# Patient Record
Sex: Male | Born: 1940 | Race: White | Hispanic: No | Marital: Married | Smoking: Former smoker
Health system: Southern US, Community
[De-identification: ages and names within clinical notes are randomized; demographics above are authoritative.]

## PROBLEM LIST (undated history)

## (undated) DIAGNOSIS — N186 End stage renal disease: Secondary | ICD-10-CM

## (undated) DIAGNOSIS — R234 Changes in skin texture: Secondary | ICD-10-CM

## (undated) DIAGNOSIS — E039 Hypothyroidism, unspecified: Secondary | ICD-10-CM

## (undated) DIAGNOSIS — E119 Type 2 diabetes mellitus without complications: Secondary | ICD-10-CM

## (undated) DIAGNOSIS — M1990 Unspecified osteoarthritis, unspecified site: Secondary | ICD-10-CM

## (undated) DIAGNOSIS — R410 Disorientation, unspecified: Secondary | ICD-10-CM

## (undated) DIAGNOSIS — Z950 Presence of cardiac pacemaker: Secondary | ICD-10-CM

## (undated) DIAGNOSIS — R0602 Shortness of breath: Secondary | ICD-10-CM

## (undated) DIAGNOSIS — R413 Other amnesia: Secondary | ICD-10-CM

## (undated) DIAGNOSIS — M2550 Pain in unspecified joint: Secondary | ICD-10-CM

## (undated) DIAGNOSIS — D649 Anemia, unspecified: Secondary | ICD-10-CM

## (undated) DIAGNOSIS — Z8719 Personal history of other diseases of the digestive system: Secondary | ICD-10-CM

## (undated) DIAGNOSIS — H269 Unspecified cataract: Secondary | ICD-10-CM

## (undated) DIAGNOSIS — G20A1 Parkinson's disease without dyskinesia, without mention of fluctuations: Secondary | ICD-10-CM

## (undated) DIAGNOSIS — F32A Depression, unspecified: Secondary | ICD-10-CM

## (undated) DIAGNOSIS — T8859XA Other complications of anesthesia, initial encounter: Secondary | ICD-10-CM

## (undated) DIAGNOSIS — I739 Peripheral vascular disease, unspecified: Secondary | ICD-10-CM

## (undated) DIAGNOSIS — K5792 Diverticulitis of intestine, part unspecified, without perforation or abscess without bleeding: Secondary | ICD-10-CM

## (undated) DIAGNOSIS — M549 Dorsalgia, unspecified: Secondary | ICD-10-CM

## (undated) DIAGNOSIS — G20 Parkinson's disease: Secondary | ICD-10-CM

## (undated) DIAGNOSIS — Z860100 Personal history of colon polyps, unspecified: Secondary | ICD-10-CM

## (undated) DIAGNOSIS — I4891 Unspecified atrial fibrillation: Secondary | ICD-10-CM

## (undated) DIAGNOSIS — G4700 Insomnia, unspecified: Secondary | ICD-10-CM

## (undated) DIAGNOSIS — J45909 Unspecified asthma, uncomplicated: Secondary | ICD-10-CM

## (undated) DIAGNOSIS — T4145XA Adverse effect of unspecified anesthetic, initial encounter: Secondary | ICD-10-CM

## (undated) DIAGNOSIS — Z9289 Personal history of other medical treatment: Secondary | ICD-10-CM

## (undated) DIAGNOSIS — M2540 Effusion, unspecified joint: Secondary | ICD-10-CM

## (undated) DIAGNOSIS — G629 Polyneuropathy, unspecified: Secondary | ICD-10-CM

## (undated) DIAGNOSIS — G8929 Other chronic pain: Secondary | ICD-10-CM

## (undated) DIAGNOSIS — I2510 Atherosclerotic heart disease of native coronary artery without angina pectoris: Secondary | ICD-10-CM

## (undated) DIAGNOSIS — R233 Spontaneous ecchymoses: Secondary | ICD-10-CM

## (undated) DIAGNOSIS — R238 Other skin changes: Secondary | ICD-10-CM

## (undated) DIAGNOSIS — I779 Disorder of arteries and arterioles, unspecified: Secondary | ICD-10-CM

## (undated) DIAGNOSIS — Z8709 Personal history of other diseases of the respiratory system: Secondary | ICD-10-CM

## (undated) DIAGNOSIS — F329 Major depressive disorder, single episode, unspecified: Secondary | ICD-10-CM

## (undated) DIAGNOSIS — I959 Hypotension, unspecified: Secondary | ICD-10-CM

## (undated) DIAGNOSIS — K219 Gastro-esophageal reflux disease without esophagitis: Secondary | ICD-10-CM

## (undated) DIAGNOSIS — J189 Pneumonia, unspecified organism: Secondary | ICD-10-CM

## (undated) DIAGNOSIS — Z87820 Personal history of traumatic brain injury: Secondary | ICD-10-CM

## (undated) DIAGNOSIS — I495 Sick sinus syndrome: Secondary | ICD-10-CM

## (undated) DIAGNOSIS — C801 Malignant (primary) neoplasm, unspecified: Secondary | ICD-10-CM

## (undated) HISTORY — PX: CORONARY ARTERY BYPASS GRAFT: SHX141

## (undated) HISTORY — PX: INSERT / REPLACE / REMOVE PACEMAKER: SUR710

## (undated) HISTORY — PX: FEMORAL ENDARTERECTOMY: SUR606

## (undated) HISTORY — PX: ADENOIDECTOMY: SUR15

## (undated) HISTORY — DX: End stage renal disease: N18.6

## (undated) HISTORY — PX: COLONOSCOPY: SHX174

## (undated) HISTORY — DX: Unspecified atrial fibrillation: I48.91

## (undated) HISTORY — DX: Sick sinus syndrome: I49.5

## (undated) HISTORY — DX: Peripheral vascular disease, unspecified: I73.9

## (undated) HISTORY — PX: BLADDER SURGERY: SHX569

## (undated) HISTORY — DX: Presence of cardiac pacemaker: Z95.0

## (undated) HISTORY — DX: Malignant (primary) neoplasm, unspecified: C80.1

## (undated) HISTORY — DX: Anemia, unspecified: D64.9

## (undated) HISTORY — DX: Diverticulitis of intestine, part unspecified, without perforation or abscess without bleeding: K57.92

## (undated) HISTORY — PX: OTHER SURGICAL HISTORY: SHX169

## (undated) HISTORY — DX: Disorder of arteries and arterioles, unspecified: I77.9

## (undated) HISTORY — PX: CATARACT EXTRACTION: SUR2

## (undated) HISTORY — PX: TONSILLECTOMY: SHX5217

## (undated) HISTORY — PX: CERVICAL FUSION: SHX112

## (undated) HISTORY — PX: TOE AMPUTATION: SHX809

## (undated) HISTORY — PX: CORONARY ANGIOPLASTY: SHX604

## (undated) HISTORY — PX: TONSILLECTOMY: SUR1361

## (undated) HISTORY — PX: CHOLECYSTECTOMY: SHX55

## (undated) HISTORY — DX: Atherosclerotic heart disease of native coronary artery without angina pectoris: I25.10

---

## 2001-03-15 ENCOUNTER — Encounter: Payer: Self-pay | Admitting: Neurosurgery

## 2001-03-17 ENCOUNTER — Ambulatory Visit (HOSPITAL_COMMUNITY): Admission: RE | Admit: 2001-03-17 | Discharge: 2001-03-17 | Payer: Self-pay | Admitting: Neurosurgery

## 2001-03-17 ENCOUNTER — Encounter: Payer: Self-pay | Admitting: Neurosurgery

## 2001-10-29 ENCOUNTER — Encounter: Payer: Self-pay | Admitting: Cardiothoracic Surgery

## 2001-11-02 ENCOUNTER — Encounter: Payer: Self-pay | Admitting: Cardiothoracic Surgery

## 2001-11-02 ENCOUNTER — Inpatient Hospital Stay (HOSPITAL_COMMUNITY): Admission: RE | Admit: 2001-11-02 | Discharge: 2001-11-11 | Payer: Self-pay | Admitting: Cardiothoracic Surgery

## 2001-11-03 ENCOUNTER — Encounter: Payer: Self-pay | Admitting: Cardiothoracic Surgery

## 2001-11-04 ENCOUNTER — Encounter: Payer: Self-pay | Admitting: Cardiothoracic Surgery

## 2001-11-05 ENCOUNTER — Encounter: Payer: Self-pay | Admitting: Cardiothoracic Surgery

## 2001-11-07 ENCOUNTER — Encounter: Payer: Self-pay | Admitting: Cardiothoracic Surgery

## 2001-11-08 ENCOUNTER — Encounter: Payer: Self-pay | Admitting: Cardiothoracic Surgery

## 2001-12-02 ENCOUNTER — Encounter: Payer: Self-pay | Admitting: Cardiothoracic Surgery

## 2001-12-02 ENCOUNTER — Encounter: Admission: RE | Admit: 2001-12-02 | Discharge: 2001-12-02 | Payer: Self-pay | Admitting: Cardiothoracic Surgery

## 2003-07-21 ENCOUNTER — Other Ambulatory Visit: Payer: Self-pay

## 2004-03-02 ENCOUNTER — Ambulatory Visit: Payer: Self-pay | Admitting: Internal Medicine

## 2004-03-05 ENCOUNTER — Emergency Department: Payer: Self-pay | Admitting: Unknown Physician Specialty

## 2004-04-02 ENCOUNTER — Ambulatory Visit: Payer: Self-pay | Admitting: Internal Medicine

## 2004-04-10 ENCOUNTER — Ambulatory Visit: Payer: Self-pay | Admitting: Specialist

## 2004-04-28 ENCOUNTER — Emergency Department: Payer: Self-pay | Admitting: Emergency Medicine

## 2004-04-29 ENCOUNTER — Ambulatory Visit: Payer: Self-pay | Admitting: Specialist

## 2004-05-02 ENCOUNTER — Ambulatory Visit: Payer: Self-pay | Admitting: Internal Medicine

## 2004-05-09 ENCOUNTER — Ambulatory Visit: Payer: Self-pay | Admitting: Specialist

## 2004-05-22 ENCOUNTER — Ambulatory Visit: Payer: Self-pay | Admitting: Internal Medicine

## 2004-06-02 ENCOUNTER — Ambulatory Visit: Payer: Self-pay | Admitting: Internal Medicine

## 2004-06-06 ENCOUNTER — Ambulatory Visit: Payer: Self-pay | Admitting: Cardiology

## 2004-06-06 ENCOUNTER — Ambulatory Visit (HOSPITAL_COMMUNITY): Admission: RE | Admit: 2004-06-06 | Discharge: 2004-06-06 | Payer: Self-pay | Admitting: Cardiology

## 2004-06-06 ENCOUNTER — Encounter: Payer: Self-pay | Admitting: Cardiology

## 2004-06-24 ENCOUNTER — Ambulatory Visit: Payer: Self-pay

## 2004-06-25 ENCOUNTER — Ambulatory Visit: Payer: Self-pay | Admitting: Internal Medicine

## 2004-06-25 ENCOUNTER — Inpatient Hospital Stay (HOSPITAL_COMMUNITY): Admission: RE | Admit: 2004-06-25 | Discharge: 2004-06-27 | Payer: Self-pay | Admitting: Internal Medicine

## 2004-06-25 HISTORY — PX: PACEMAKER INSERTION: SHX728

## 2004-07-02 ENCOUNTER — Ambulatory Visit: Payer: Self-pay

## 2004-07-02 ENCOUNTER — Ambulatory Visit: Payer: Self-pay | Admitting: Cardiology

## 2004-07-02 ENCOUNTER — Ambulatory Visit: Payer: Self-pay | Admitting: Internal Medicine

## 2004-07-03 ENCOUNTER — Ambulatory Visit: Payer: Self-pay | Admitting: Internal Medicine

## 2004-07-23 ENCOUNTER — Ambulatory Visit: Payer: Self-pay | Admitting: Internal Medicine

## 2004-07-31 ENCOUNTER — Ambulatory Visit: Payer: Self-pay | Admitting: Internal Medicine

## 2004-08-27 ENCOUNTER — Ambulatory Visit: Payer: Self-pay | Admitting: Specialist

## 2004-09-03 ENCOUNTER — Ambulatory Visit: Payer: Self-pay | Admitting: Specialist

## 2004-09-04 ENCOUNTER — Ambulatory Visit: Payer: Self-pay | Admitting: Internal Medicine

## 2004-09-30 ENCOUNTER — Ambulatory Visit: Payer: Self-pay | Admitting: Internal Medicine

## 2004-10-15 ENCOUNTER — Ambulatory Visit: Payer: Self-pay | Admitting: Internal Medicine

## 2004-10-31 ENCOUNTER — Ambulatory Visit: Payer: Self-pay | Admitting: Internal Medicine

## 2004-11-20 ENCOUNTER — Ambulatory Visit: Payer: Self-pay | Admitting: Vascular Surgery

## 2004-11-22 ENCOUNTER — Ambulatory Visit: Payer: Self-pay | Admitting: Vascular Surgery

## 2004-11-30 ENCOUNTER — Ambulatory Visit: Payer: Self-pay | Admitting: Internal Medicine

## 2004-12-31 ENCOUNTER — Ambulatory Visit: Payer: Self-pay | Admitting: Internal Medicine

## 2005-01-30 ENCOUNTER — Ambulatory Visit: Payer: Self-pay | Admitting: Specialist

## 2005-02-04 ENCOUNTER — Ambulatory Visit: Payer: Self-pay | Admitting: Specialist

## 2005-02-06 ENCOUNTER — Ambulatory Visit: Payer: Self-pay | Admitting: Internal Medicine

## 2005-03-02 ENCOUNTER — Ambulatory Visit: Payer: Self-pay | Admitting: Internal Medicine

## 2005-04-02 ENCOUNTER — Ambulatory Visit: Payer: Self-pay | Admitting: Internal Medicine

## 2005-04-16 ENCOUNTER — Ambulatory Visit: Payer: Self-pay | Admitting: Unknown Physician Specialty

## 2005-04-28 ENCOUNTER — Other Ambulatory Visit: Payer: Self-pay

## 2005-04-28 ENCOUNTER — Inpatient Hospital Stay: Payer: Self-pay | Admitting: Internal Medicine

## 2005-05-02 ENCOUNTER — Ambulatory Visit: Payer: Self-pay | Admitting: Internal Medicine

## 2005-05-14 ENCOUNTER — Inpatient Hospital Stay: Payer: Self-pay | Admitting: Internal Medicine

## 2005-05-28 ENCOUNTER — Ambulatory Visit: Payer: Self-pay | Admitting: Specialist

## 2005-06-03 ENCOUNTER — Ambulatory Visit: Payer: Self-pay | Admitting: Specialist

## 2005-06-04 ENCOUNTER — Ambulatory Visit: Payer: Self-pay | Admitting: Internal Medicine

## 2005-06-30 ENCOUNTER — Ambulatory Visit: Payer: Self-pay | Admitting: Otolaryngology

## 2005-07-03 ENCOUNTER — Ambulatory Visit: Payer: Self-pay | Admitting: Otolaryngology

## 2005-07-04 ENCOUNTER — Ambulatory Visit: Payer: Self-pay | Admitting: Internal Medicine

## 2005-07-31 ENCOUNTER — Ambulatory Visit: Payer: Self-pay | Admitting: Internal Medicine

## 2005-08-31 ENCOUNTER — Ambulatory Visit: Payer: Self-pay | Admitting: Internal Medicine

## 2005-09-30 ENCOUNTER — Ambulatory Visit: Payer: Self-pay | Admitting: Internal Medicine

## 2005-10-24 ENCOUNTER — Ambulatory Visit: Payer: Self-pay | Admitting: Vascular Surgery

## 2005-10-28 ENCOUNTER — Ambulatory Visit: Payer: Self-pay | Admitting: Vascular Surgery

## 2005-10-31 ENCOUNTER — Ambulatory Visit: Payer: Self-pay | Admitting: Internal Medicine

## 2005-11-12 ENCOUNTER — Encounter: Admission: RE | Admit: 2005-11-12 | Discharge: 2005-11-12 | Payer: Self-pay | Admitting: Neurosurgery

## 2005-11-30 ENCOUNTER — Ambulatory Visit: Payer: Self-pay | Admitting: Internal Medicine

## 2005-12-18 ENCOUNTER — Ambulatory Visit: Payer: Self-pay | Admitting: Specialist

## 2005-12-18 ENCOUNTER — Other Ambulatory Visit: Payer: Self-pay

## 2005-12-31 ENCOUNTER — Ambulatory Visit: Payer: Self-pay | Admitting: Internal Medicine

## 2006-01-29 ENCOUNTER — Ambulatory Visit: Payer: Self-pay | Admitting: Specialist

## 2006-01-31 ENCOUNTER — Ambulatory Visit: Payer: Self-pay | Admitting: Internal Medicine

## 2006-03-02 ENCOUNTER — Ambulatory Visit: Payer: Self-pay | Admitting: Internal Medicine

## 2006-04-02 ENCOUNTER — Ambulatory Visit: Payer: Self-pay | Admitting: Internal Medicine

## 2006-05-02 ENCOUNTER — Ambulatory Visit: Payer: Self-pay | Admitting: Internal Medicine

## 2006-06-02 ENCOUNTER — Ambulatory Visit: Payer: Self-pay | Admitting: Internal Medicine

## 2006-07-03 ENCOUNTER — Ambulatory Visit: Payer: Self-pay | Admitting: Internal Medicine

## 2006-07-31 ENCOUNTER — Ambulatory Visit: Payer: Self-pay | Admitting: Urology

## 2006-07-31 ENCOUNTER — Other Ambulatory Visit: Payer: Self-pay

## 2006-08-11 ENCOUNTER — Ambulatory Visit: Payer: Self-pay | Admitting: Urology

## 2006-08-14 ENCOUNTER — Ambulatory Visit: Payer: Self-pay | Admitting: Internal Medicine

## 2006-09-01 ENCOUNTER — Ambulatory Visit: Payer: Self-pay | Admitting: Internal Medicine

## 2006-10-01 ENCOUNTER — Ambulatory Visit: Payer: Self-pay | Admitting: Internal Medicine

## 2006-11-01 ENCOUNTER — Ambulatory Visit: Payer: Self-pay | Admitting: Internal Medicine

## 2006-12-01 ENCOUNTER — Ambulatory Visit: Payer: Self-pay | Admitting: Internal Medicine

## 2007-01-01 ENCOUNTER — Ambulatory Visit: Payer: Self-pay | Admitting: Internal Medicine

## 2007-02-01 ENCOUNTER — Ambulatory Visit: Payer: Self-pay | Admitting: Internal Medicine

## 2007-03-03 ENCOUNTER — Ambulatory Visit: Payer: Self-pay | Admitting: Internal Medicine

## 2007-04-03 ENCOUNTER — Ambulatory Visit: Payer: Self-pay | Admitting: Internal Medicine

## 2007-05-03 ENCOUNTER — Ambulatory Visit: Payer: Self-pay | Admitting: Internal Medicine

## 2007-06-03 ENCOUNTER — Ambulatory Visit: Payer: Self-pay | Admitting: Internal Medicine

## 2007-06-14 ENCOUNTER — Ambulatory Visit: Payer: Self-pay | Admitting: Internal Medicine

## 2007-07-02 ENCOUNTER — Encounter: Payer: Self-pay | Admitting: Orthopedic Surgery

## 2007-07-04 ENCOUNTER — Ambulatory Visit: Payer: Self-pay | Admitting: Internal Medicine

## 2007-07-06 ENCOUNTER — Encounter: Payer: Self-pay | Admitting: Orthopedic Surgery

## 2007-07-12 ENCOUNTER — Ambulatory Visit: Payer: Self-pay | Admitting: Internal Medicine

## 2007-08-01 ENCOUNTER — Ambulatory Visit: Payer: Self-pay | Admitting: Internal Medicine

## 2007-09-01 ENCOUNTER — Ambulatory Visit: Payer: Self-pay | Admitting: Internal Medicine

## 2007-10-01 ENCOUNTER — Ambulatory Visit: Payer: Self-pay | Admitting: Internal Medicine

## 2007-11-01 ENCOUNTER — Ambulatory Visit: Payer: Self-pay | Admitting: Internal Medicine

## 2007-12-01 ENCOUNTER — Ambulatory Visit: Payer: Self-pay | Admitting: Internal Medicine

## 2007-12-27 ENCOUNTER — Ambulatory Visit: Payer: Self-pay | Admitting: Internal Medicine

## 2008-01-01 ENCOUNTER — Ambulatory Visit: Payer: Self-pay | Admitting: Internal Medicine

## 2008-01-05 ENCOUNTER — Ambulatory Visit: Payer: Self-pay

## 2008-01-05 ENCOUNTER — Encounter: Payer: Self-pay | Admitting: Internal Medicine

## 2008-01-11 ENCOUNTER — Emergency Department: Payer: Self-pay | Admitting: Emergency Medicine

## 2008-01-17 ENCOUNTER — Ambulatory Visit: Payer: Self-pay | Admitting: Internal Medicine

## 2008-02-08 ENCOUNTER — Ambulatory Visit: Payer: Self-pay | Admitting: Internal Medicine

## 2008-02-19 ENCOUNTER — Emergency Department: Payer: Self-pay | Admitting: Emergency Medicine

## 2008-03-01 ENCOUNTER — Ambulatory Visit: Payer: Self-pay | Admitting: Urology

## 2008-03-02 ENCOUNTER — Ambulatory Visit: Payer: Self-pay | Admitting: Internal Medicine

## 2008-03-07 ENCOUNTER — Ambulatory Visit: Payer: Self-pay | Admitting: Urology

## 2008-03-10 ENCOUNTER — Ambulatory Visit: Payer: Self-pay | Admitting: Internal Medicine

## 2008-04-02 ENCOUNTER — Ambulatory Visit: Payer: Self-pay | Admitting: Internal Medicine

## 2008-05-02 ENCOUNTER — Ambulatory Visit: Payer: Self-pay | Admitting: Internal Medicine

## 2008-06-02 ENCOUNTER — Ambulatory Visit: Payer: Medicare Other | Admitting: Internal Medicine

## 2008-06-19 ENCOUNTER — Ambulatory Visit: Payer: Self-pay | Admitting: Internal Medicine

## 2008-07-03 ENCOUNTER — Ambulatory Visit: Payer: Medicare Other | Admitting: Internal Medicine

## 2008-07-27 ENCOUNTER — Ambulatory Visit: Payer: Medicare Other | Admitting: Urology

## 2008-07-31 ENCOUNTER — Ambulatory Visit: Payer: Medicare Other | Admitting: Internal Medicine

## 2008-08-03 ENCOUNTER — Ambulatory Visit: Payer: Medicare Other | Admitting: Specialist

## 2008-08-03 ENCOUNTER — Encounter: Payer: Self-pay | Admitting: Internal Medicine

## 2008-08-07 ENCOUNTER — Ambulatory Visit: Payer: Medicare Other | Admitting: Urology

## 2008-08-11 ENCOUNTER — Ambulatory Visit: Payer: Medicare Other | Admitting: Anesthesiology

## 2008-08-15 ENCOUNTER — Ambulatory Visit: Payer: Medicare Other | Admitting: Urology

## 2008-08-31 ENCOUNTER — Ambulatory Visit: Payer: Medicare Other | Admitting: Internal Medicine

## 2008-09-06 ENCOUNTER — Ambulatory Visit: Payer: Medicare Other | Admitting: Urology

## 2008-09-08 ENCOUNTER — Encounter (INDEPENDENT_AMBULATORY_CARE_PROVIDER_SITE_OTHER): Payer: Self-pay | Admitting: Radiology

## 2008-09-11 ENCOUNTER — Ambulatory Visit: Payer: Medicare Other | Admitting: Internal Medicine

## 2008-09-30 ENCOUNTER — Ambulatory Visit: Payer: Medicare Other | Admitting: Internal Medicine

## 2008-11-06 ENCOUNTER — Ambulatory Visit: Payer: Medicare Other | Admitting: Internal Medicine

## 2008-11-07 ENCOUNTER — Ambulatory Visit: Payer: Medicare Other | Admitting: Urology

## 2008-11-09 DIAGNOSIS — I2510 Atherosclerotic heart disease of native coronary artery without angina pectoris: Secondary | ICD-10-CM | POA: Insufficient documentation

## 2008-11-09 DIAGNOSIS — I4891 Unspecified atrial fibrillation: Secondary | ICD-10-CM

## 2008-11-09 DIAGNOSIS — I498 Other specified cardiac arrhythmias: Secondary | ICD-10-CM

## 2008-11-09 DIAGNOSIS — Z950 Presence of cardiac pacemaker: Secondary | ICD-10-CM

## 2008-11-09 DIAGNOSIS — I739 Peripheral vascular disease, unspecified: Secondary | ICD-10-CM

## 2008-11-14 ENCOUNTER — Encounter: Payer: Self-pay | Admitting: Cardiology

## 2008-11-15 ENCOUNTER — Encounter: Payer: Self-pay | Admitting: Cardiology

## 2008-11-21 ENCOUNTER — Ambulatory Visit: Payer: Medicare Other | Admitting: Urology

## 2008-11-30 ENCOUNTER — Ambulatory Visit: Payer: Medicare Other | Admitting: Internal Medicine

## 2008-12-08 ENCOUNTER — Encounter: Payer: Self-pay | Admitting: Cardiovascular Disease

## 2008-12-08 LAB — CONVERTED CEMR LAB
BUN: 62 mg/dL
CO2: 18 meq/L
Calcium: 9.3 mg/dL
Chloride: 106 meq/L
Creatinine, Ser: 4.94 mg/dL
Glucose, Bld: 100 mg/dL
Sodium: 140 meq/L

## 2008-12-22 ENCOUNTER — Encounter: Payer: Self-pay | Admitting: Internal Medicine

## 2008-12-31 ENCOUNTER — Ambulatory Visit: Payer: Medicare Other | Admitting: Internal Medicine

## 2009-01-08 ENCOUNTER — Ambulatory Visit: Payer: Self-pay | Admitting: Internal Medicine

## 2009-01-08 DIAGNOSIS — Z992 Dependence on renal dialysis: Secondary | ICD-10-CM

## 2009-01-08 DIAGNOSIS — N186 End stage renal disease: Secondary | ICD-10-CM | POA: Insufficient documentation

## 2009-01-09 ENCOUNTER — Ambulatory Visit: Payer: Medicare Other | Admitting: Urology

## 2009-01-31 ENCOUNTER — Ambulatory Visit: Payer: Medicare Other | Admitting: Internal Medicine

## 2009-02-12 ENCOUNTER — Ambulatory Visit: Payer: Medicare Other | Admitting: Internal Medicine

## 2009-02-23 ENCOUNTER — Ambulatory Visit: Payer: Medicare Other | Admitting: Neurosurgery

## 2009-03-02 ENCOUNTER — Ambulatory Visit: Payer: Medicare Other | Admitting: Internal Medicine

## 2009-04-02 ENCOUNTER — Ambulatory Visit: Payer: Medicare Other | Admitting: Internal Medicine

## 2009-04-16 ENCOUNTER — Ambulatory Visit: Payer: Self-pay | Admitting: Internal Medicine

## 2009-04-23 ENCOUNTER — Ambulatory Visit: Payer: Medicare Other | Admitting: Urology

## 2009-05-02 ENCOUNTER — Ambulatory Visit: Payer: Medicare Other | Admitting: Internal Medicine

## 2009-06-02 ENCOUNTER — Ambulatory Visit: Payer: Medicare Other | Admitting: Internal Medicine

## 2009-06-04 ENCOUNTER — Ambulatory Visit: Payer: Medicare Other | Admitting: Pain Medicine

## 2009-06-07 ENCOUNTER — Ambulatory Visit: Payer: Medicare Other | Admitting: Pain Medicine

## 2009-06-11 ENCOUNTER — Ambulatory Visit: Payer: Medicare Other | Admitting: Internal Medicine

## 2009-06-19 ENCOUNTER — Ambulatory Visit: Payer: Medicare Other | Admitting: Pain Medicine

## 2009-07-02 ENCOUNTER — Ambulatory Visit: Payer: Medicare Other | Admitting: Specialist

## 2009-07-02 ENCOUNTER — Encounter: Payer: Self-pay | Admitting: Internal Medicine

## 2009-07-03 ENCOUNTER — Ambulatory Visit: Payer: Medicare Other | Admitting: Internal Medicine

## 2009-07-09 ENCOUNTER — Encounter: Payer: Self-pay | Admitting: Internal Medicine

## 2009-07-09 ENCOUNTER — Ambulatory Visit: Payer: Self-pay | Admitting: Internal Medicine

## 2009-07-09 DIAGNOSIS — I509 Heart failure, unspecified: Secondary | ICD-10-CM | POA: Insufficient documentation

## 2009-07-09 DIAGNOSIS — R0602 Shortness of breath: Secondary | ICD-10-CM

## 2009-07-11 ENCOUNTER — Ambulatory Visit: Payer: Medicare Other | Admitting: Pain Medicine

## 2009-07-12 ENCOUNTER — Encounter: Payer: Self-pay | Admitting: Cardiovascular Disease

## 2009-07-12 ENCOUNTER — Ambulatory Visit: Payer: Self-pay

## 2009-07-12 ENCOUNTER — Encounter: Payer: Self-pay | Admitting: Internal Medicine

## 2009-07-13 LAB — CONVERTED CEMR LAB
BUN: 78 mg/dL — ABNORMAL HIGH (ref 6–23)
Creatinine, Ser: 6.18 mg/dL — ABNORMAL HIGH (ref 0.40–1.50)
Glucose, Bld: 116 mg/dL — ABNORMAL HIGH (ref 70–99)
Pro B Natriuretic peptide (BNP): 829.6 pg/mL — ABNORMAL HIGH (ref 0.0–100.0)

## 2009-07-16 ENCOUNTER — Encounter: Payer: Self-pay | Admitting: Cardiovascular Disease

## 2009-07-19 ENCOUNTER — Ambulatory Visit: Payer: Self-pay | Admitting: Internal Medicine

## 2009-07-24 ENCOUNTER — Ambulatory Visit: Payer: Medicare Other | Admitting: Pain Medicine

## 2009-07-25 ENCOUNTER — Ambulatory Visit: Payer: Medicare Other | Admitting: Vascular Surgery

## 2009-07-31 ENCOUNTER — Ambulatory Visit: Payer: Medicare Other | Admitting: Internal Medicine

## 2009-08-01 ENCOUNTER — Ambulatory Visit: Payer: Medicare Other | Admitting: Vascular Surgery

## 2009-08-09 ENCOUNTER — Ambulatory Visit: Payer: Medicare Other | Admitting: Pain Medicine

## 2009-08-13 ENCOUNTER — Ambulatory Visit: Payer: Medicare Other | Admitting: Internal Medicine

## 2009-08-31 ENCOUNTER — Ambulatory Visit: Payer: Medicare Other | Admitting: Internal Medicine

## 2009-10-10 ENCOUNTER — Ambulatory Visit: Payer: Self-pay | Admitting: Internal Medicine

## 2009-10-16 ENCOUNTER — Encounter: Payer: Self-pay | Admitting: Internal Medicine

## 2009-10-24 ENCOUNTER — Ambulatory Visit: Payer: Medicare Other | Admitting: Urology

## 2010-01-02 ENCOUNTER — Encounter: Admission: RE | Admit: 2010-01-02 | Discharge: 2010-01-02 | Payer: Self-pay | Admitting: Neurosurgery

## 2010-01-10 ENCOUNTER — Ambulatory Visit: Payer: Self-pay | Admitting: Internal Medicine

## 2010-01-21 ENCOUNTER — Encounter: Payer: Self-pay | Admitting: Internal Medicine

## 2010-02-08 ENCOUNTER — Encounter: Payer: Self-pay | Admitting: Internal Medicine

## 2010-02-08 ENCOUNTER — Observation Stay: Payer: Medicare Other | Admitting: Specialist

## 2010-02-09 ENCOUNTER — Encounter: Payer: Self-pay | Admitting: Internal Medicine

## 2010-02-15 ENCOUNTER — Encounter: Payer: Self-pay | Admitting: Internal Medicine

## 2010-02-15 ENCOUNTER — Ambulatory Visit: Payer: Self-pay | Admitting: Internal Medicine

## 2010-03-05 ENCOUNTER — Ambulatory Visit: Payer: Medicare Other | Admitting: Vascular Surgery

## 2010-03-29 ENCOUNTER — Ambulatory Visit: Payer: Medicare Other | Admitting: Specialist

## 2010-03-29 ENCOUNTER — Encounter (INDEPENDENT_AMBULATORY_CARE_PROVIDER_SITE_OTHER): Payer: Self-pay | Admitting: *Deleted

## 2010-04-08 ENCOUNTER — Ambulatory Visit: Payer: Self-pay | Admitting: Internal Medicine

## 2010-05-01 ENCOUNTER — Ambulatory Visit: Payer: Medicare Other | Admitting: Urology

## 2010-07-02 NOTE — Letter (Signed)
Summary: Remote Device Check  [employer], Main Office  [address]   [place], [place] [address]   Phone: [phone]  Fax: [phone]     Oct 16, 2009 MRN: [id number]   [facility] [address] [place], [place]  [address]   Dear Mr. MOSKAL,   Your remote transmission was recieved and reviewed by your physician.  All diagnostics were within normal limits for you.  __X___Your next transmission is scheduled for:  01-10-2010. Please transmit at any time this day.  If you have a wireless device your transmission will be sent automatically.      Sincerely,  Vella Kohler

## 2010-07-02 NOTE — Cardiovascular Report (Signed)
Summary: Office Visit Remote   Office Visit Remote   Imported By: Roderic Ovens 10/17/2009 10:58:26  _____________________________________________________________________  External Attachment:    Type:   Image     Comment:   External Document

## 2010-07-02 NOTE — Cardiovascular Report (Signed)
Summary: Office Visit Remote   Office Visit Remote   Imported By: Roderic Ovens 01/22/2010 15:49:04  _____________________________________________________________________  External Attachment:    Type:   Image     Comment:   External Document

## 2010-07-02 NOTE — Assessment & Plan Note (Signed)
Summary: F1Y/AMD  Medications Added ASPIR-LOW 81 MG TBEC (ASPIRIN) two tablets once daily      Allergies Added:   Visit Type:  Follow-up Primary Erryn Dickison:  michael Blocker  CC:  c/o increased blood pressure at times.  Denies chest pain or shortness of breath..  History of Present Illness: Mr. Stacks was seen by Dr. Ladona Ridgel 2 months agor or PPM followup.  He has been doing relatively well. His limitations are late largely related to his spinal stenosis. He denies chest pain or shortness of breath. He is tolerating his dialysis well. There has been no intercurrent syncope.  Previously he was on Coumadin. He has not tolerated that because of bleeding issues. He currently takes aspirin for stroke prophylaxis.     Current Medications (verified): 1)  Amitriptyline Hcl 50 Mg Tabs (Amitriptyline Hcl) .... By Mouth Daily At Bedtime 2)  Avodart 0.5 Mg Caps (Dutasteride) .... By Mouth Weekly 3)  Amlodipine Besylate 5 Mg Tabs (Amlodipine Besylate) .... Take One Tablet By Mouth Daily 4)  Levoxyl 125 Mcg Tabs (Levothyroxine Sodium) .... By Mouth Daily 5)  Carbidopa-Levodopa Cr 25-100 Mg Cr-Tabs (Carbidopa-Levodopa) .... By Mouth Daily At Noon 6)  Proventil Hfa 108 (90 Base) Mcg/act Aers (Albuterol Sulfate) .... As Directed 7)  Procrit 3000 Unit/ml Soln (Epoetin Alfa) .... 2inj  X2 Weeks 8)  Coq10 100 Mg Caps (Coenzyme Q10) .Marland Kitchen.. 1 By Mouth Three Times A Day 9)  Fish Oil 1200 Mg Caps (Omega-3 Fatty Acids) .... By Mouth Daily 10)  Vitamin D3 1000 Unit Caps (Cholecalciferol) .... By Mouth Dail;y 11)  B Complex  Tabs (B Complex Vitamins) .... By Mouth Daily 12)  Centrum Silver Ultra Mens  Tabs (Multiple Vitamins-Minerals) .... By Mouth Daily 13)  Aspir-Low 81 Mg Tbec (Aspirin) .... Two Tablets Once Daily  Allergies (verified): 1)  ! Cipro 2)  ! Morphine 3)  ! Niacin  Past History:  Past Medical History: Last updated: 11/09/2008 CAD, UNSPECIFIED SITE (ICD-414.00) CAROTID ARTERY  DISEASE (ICD-433.10) PVD (ICD-443.9) BRADYCARDIA (ICD-427.89) ATRIAL FIBRILLATION (ICD-427.31) PACEMAKER (ICD-V45.Marland Kitchen01)    Past Surgical History: Last updated: 11/09/2008 CABG Tonsillectomy Bladder pacemaker -  Medtronic N-rhythm P1501DR -  1/24/6  Social History: Last updated: 11/09/2008 Retired  Married  Tobacco Use - Former.  '03 Alcohol Use - no Regular Exercise - yes Drug Use - no  Risk Factors: Exercise: yes (11/09/2008)  Risk Factors: Smoking Status: quit (11/09/2008)  Vital Signs:  Patient profile:   70 year old male Height:      72 inches Weight:      223 pounds BMI:     30.35 Pulse rate:   88 / minute BP sitting:   120 / 64  (left arm) Cuff size:   large  Vitals Entered By: Bishop Dublin, CMA (April 08, 2010 10:28 AM)  Physical Exam  General:  The patient was alert and oriented in no acute distress. HEENT Normal.  Neck veins were flat, carotids were brisk.  Lungs were clear.  Heart sounds were regular without murmurs or gallops.  Abdomen was soft with active bowel sounds. There is no clubbing cyanosis or edema. Skin Warm and dry    PPM Specifications Following MD:  Sherryl Manges, MD     PPM Vendor:  Medtronic     PPM Model Number:  [id number]     PPM Serial Number:  [id number] [facility] PPM DOI:  06/25/2004     PPM Implanting MD:  Sherryl Manges, MD  Lead 1    Location:  RA     DOI: 06/25/2004     Model #: [id number]     Serial #: [id number]     Status: active Lead 2    Location: RV     DOI: 06/25/2004     Model #: [id number]     Serial #: [id number]     Status: active  Magnet Response Rate:  BOL 85 ERI 65  Indications:  A-FLUTTER WTH ABLATION   PPM Follow Up Remote Check?  No Battery Voltage:  2.92 V     Pacer Dependent:  No       PPM Device Measurements Atrium  Amplitude: 0.8 mV, Impedance: 400 ohms,  Right Ventricle  Amplitude: 3.1 mV, Impedance: 576 ohms, Threshold: 0.5 V at 0.4 msec  Episodes Percent Mode Switch:  100%     Coumadin:  No Atrial Pacing:   2.3%     Ventricular Pacing:  9.3%  Parameters Mode:  DDIR     Lower Rate Limit:  60     Upper Rate Limit:  130 Paced AV Delay:  180     Next Remote Date:  07/11/2010     Next Cardiology Appt Due:  04/03/2011 Tech Comments:  No parameter changes. Device function normal.  A-fib 100%, -coumadin.  Resting rate today 90bpm. [facility] transmissions every 3 months. ROV 1 year with Dr. Graciela Husbands in [place]. Altha Harm, LPN  April 08, 2010 10:42 AM   Impression & Recommendations:  Problem # 1:  PACEMAKER, PERMANENT (ICD-V45.01) Device parameters and data were reviewed and no changes were made  Problem # 2:  ATRIAL FIBRILLATION (ICD-427.31) atrial fibrillation is now permanent. He is managed for stroke prophylaxis with aspirin at 162 mg. He he did not previously tolerate Coumadin.  Rate control is adequate with a mean rate in the 80s The following medications were removed from the medication list:    Plavix 75 Mg Tabs (Clopidogrel bisulfate) .Marland Kitchen... Take one tablet by mouth daily - on hold per pain manganement    Carvedilol 12.5 Mg Tabs (Carvedilol) .Marland Kitchen... Take one tablet by mouth twice a day His updated medication list for this problem includes:    Aspir-low 81 Mg Tbec (Aspirin) .Marland Kitchen..Marland Kitchen Two tablets once daily

## 2010-07-02 NOTE — Cardiovascular Report (Signed)
Summary: Office Visit   Office Visit   Imported By: Roderic Ovens 02/18/2010 15:38:16  _____________________________________________________________________  External Attachment:    Type:   Image     Comment:   External Document

## 2010-07-02 NOTE — Letter (Signed)
Summary: [facility]   Imported By: Marylou Mccoy 03/04/2010 13:23:56  _____________________________________________________________________  External Attachment:    Type:   Image     Comment:   External Document

## 2010-07-02 NOTE — Miscellaneous (Signed)
Summary: labs  Clinical Lists Changes  Observations: Added new observation of TRIGLYCERIDE: 81 mg/dL (16/03/9603 5:40) Added new observation of HDL: 60 mg/dL (98/04/9146 8:29) Added new observation of LDL: 40 mg/dL (56/21/3086 5:78) Added new observation of ALBUMIN: 2.2 g/dL (46/96/2952 8:41) Added new observation of PROTEIN, TOT: 6.2 g/dL (32/44/0102 7:25) Added new observation of CHOLESTEROL: 116 mg/dL (36/64/4034 7:42) Added new observation of CALCIUM: 9.5 mg/dL (59/56/3875 6:43) Added new observation of ALK PHOS: 68 units/L (07/06/2009 8:54) Added new observation of BILI TOTAL: 0.7 mg/dL (32/95/1884 1:66) Added new observation of SGPT (ALT): 23 units/L (07/06/2009 8:54) Added new observation of SGOT (AST): 30 units/L (07/06/2009 8:54) Added new observation of CALCIUM: 9.5 mg/dL (11/30/1599 0:93) Added new observation of CO2 PLSM/SER: 24 meq/L (07/06/2009 8:53) Added new observation of CL SERUM: 108 meq/L (07/06/2009 8:53) Added new observation of K SERUM: 4.1 meq/L (07/06/2009 8:53) Added new observation of NA: 144 meq/L (07/06/2009 8:53) Added new observation of CREATININE: 0.99 mg/dL (23/55/7322 0:25) Added new observation of BUN: 14 mg/dL (42/70/6237 6:28) Added new observation of BG RANDOM: 93 mg/dL (31/51/7616 0:73) Added new observation of CALCIUM: 9.3 mg/dL (71/10/2692 8:54) Added new observation of CO2 PLSM/SER: 24 meq/L (12/08/2008 8:53) Added new observation of CL SERUM: 106 meq/L (12/08/2008 8:53) Added new observation of K SERUM: 5.3 meq/L (12/08/2008 8:53) Added new observation of NA: 140 meq/L (12/08/2008 8:53) Added new observation of CREATININE: 5.13 mg/dL (62/70/3500 9:38) Added new observation of BUN: 82 mg/dL (18/29/9371 6:96) Added new observation of BG RANDOM: 100 mg/dL (78/93/8101 7:51) Added new observation of HGB: 9.7 g/dL (02/58/5277 8:24) Added new observation of CO2 PLSM/SER: 18 meq/L (12/08/2008 8:51) Added new observation of CL SERUM: 106 meq/L  (12/08/2008 8:51) Added new observation of K SERUM: 5.0 meq/L (12/08/2008 8:51) Added new observation of NA: 138 meq/L (12/08/2008 8:51) Added new observation of CREATININE: 4.94 mg/dL (23/53/6144 3:15) Added new observation of BUN: 62 mg/dL (40/01/6760 9:50)      -  Date:  07/06/2009    SGOT (AST): 30    SGPT (ALT): 23    T. Bilirubin: 0.7    Alk Phos: 68    Cholesterol: 116    Total Protein: 6.2    Albumin: 2.2    LDL: 40    HDL: 60    Triglycerides: 81  Date:  12/08/2008    HGB: 9.7

## 2010-07-02 NOTE — Assessment & Plan Note (Signed)
Summary: ROV/AMD  Medications Added PLAVIX 75 MG TABS (CLOPIDOGREL BISULFATE) Take one tablet by mouth daily - on hold per pain manganement      Allergies Added:    Visit Type:  Follow-up Primary Provider:  michael Blocker  CC:  feeling horrible.  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  an echo done 2 weeks ago because of symptoms of shortness of breath demonstrated - Left ventricle: The cavity size was mildly dilated. Systolic       function was mildly to moderately reduced. The estimated ejection       fraction was in the range of 40%. Hypokinesis of the anteroseptal       myocardium. Hypokinesis of the anterior myocardium. Doppler       parameters are consistent with abnormal left ventricular       relaxation (grade 1 diastolic dysfunction at that visit his diuretics were increased and repeat blood work was obtained. Last Friday that number was 6.18.  His other major complaints apart from leg pain is sleepiness and poor exercise tolerance. Since the decrease in his diuretic last Friday he also notes he is put on 12 or 15 pounds.  Cardiovascular Risk History:      Positive major cardiovascular risk factors include male age 19 years old or older.  Negative major cardiovascular risk factors include non-tobacco-user status.        Positive history for target organ damage include ASHD (either angina; prior MI; prior CABG), cardiac end organ damage (either CHF or LVH), peripheral vascular disease, and renal insufficiency.    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 By Mouth Daily At Bedtime 2)  Plavix 75 Mg Tabs (Clopidogrel Bisulfate) .... Take One Tablet By Mouth Daily - On Hold Per Pain Manganement 3)  Avodart 0.5 Mg Caps (Dutasteride) .... By Mouth Weekly 4)  Carvedilol 12.5 Mg Tabs (Carvedilol) .... Take One Tablet By Mouth Twice A Day 5)  Advair Diskus 100-50 Mcg/dose Aepb (Fluticasone-Salmeterol) .... As Directed 6)  Amlodipine Besylate 5 Mg Tabs (Amlodipine Besylate) .... Take One Tablet By Mouth Daily 7)  Levoxyl 125 Mcg Tabs (Levothyroxine Sodium) .... By Mouth Daily 8)  Furosemide 40 Mg Tabs (Furosemide) .... Take One Tablet By Mouth Twice Daily 9)  Carbidopa-Levodopa Cr 25-100 Mg Cr-Tabs (Carbidopa-Levodopa) .... By Mouth Daily At Noon 10)  Sodium Bicarbonate 650 Mg Tabs (Sodium Bicarbonate) .... 4x Daily 11)  Proventil Hfa 108 (90 Base) Mcg/act Aers (Albuterol Sulfate) .... As Directed 12)  Glipizide 2.5 Mg Xr24h-Tab (Glipizide) .... By Mouth Daily 13)  Procrit 3000 Unit/ml Soln (Epoetin Alfa) .... 2inj  X2 Weeks 14)  Coq10 100 Mg Caps (Coenzyme Q10) .Marland Kitchen.. 1 By Mouth Three Times A Day 15)  Fish Oil 1200 Mg Caps (Omega-3 Fatty Acids) .... By Mouth Daily 16)  Vitamin D3 1000 Unit Caps (Cholecalciferol) .... By Mouth Dail;y 17)  B Complex  Tabs (B Complex Vitamins) .... By Mouth Daily 18)  Centrum Silver Ultra Mens  Tabs (Multiple Vitamins-Minerals) .... By Mouth Daily 19)  Tandem F 162-115.2-1 Mg Caps (Ferrous Fum-Iron Polysacch-Fa) .Marland Kitchen.. 1 Tab By Mouth Daily  Allergies (verified): 1)  ! Cipro 2)  ! Morphine 3)  ! Niacin  Past History:  Past Medical History: Last updated: 11/09/2008 CAD, UNSPECIFIED SITE (ICD-414.00) CAROTID ARTERY DISEASE  (ICD-433.10) PVD (ICD-443.9) BRADYCARDIA (ICD-427.89) ATRIAL FIBRILLATION (ICD-427.31) PACEMAKER (ICD-V45.Marland Kitchen01)    Past Surgical History: Last updated: 11/09/2008 CABG Tonsillectomy Bladder pacemaker -  Medtronic N-rhythm P1501DR -  1/24/6  Social History: Last updated: 11/09/2008 Retired  Married  Tobacco Use - Former.  '03 Alcohol Use - no Regular Exercise - yes Drug Use - no  Risk Factors: Exercise: yes (11/09/2008)  Risk Factors: Smoking Status: quit (11/09/2008)  Vital Signs:  Patient profile:   70 year old male Height:      72 inches Weight:      243.25 pounds BMI:     33.11 Pulse rate:   80 / minute Pulse rhythm:   regular BP sitting:   164 / 85  (left arm) Cuff size:   large  Vitals Entered By: Mercer Pod (July 19, 2009 12:42 PM)  Physical Exam  General:  The patient was alert and oriented in no acute distress. HEENT Normal.  Neck veins were 10-11cm  carotids were brisk.  Lungs bibasilar crackles Heart sounds were regular with 2/6 m Abdomen was soft with active bowel sounds. There is no clubbing cyanosis 3+ edema Skin Warm and dry    PPM Specifications Following MD:  Sherryl Manges, MD     PPM Vendor:  Medtronic     PPM Model Number:  [id number]     PPM Serial Number:  [id number] PPM DOI:  06/25/2004     PPM Implanting MD:  Sherryl Manges, MD  Lead 1    Location: RA     DOI: 06/25/2004     Model #: 1914     Serial #: [id number]     Status: active Lead 2    Location: RV     DOI: 06/25/2004     Model #: 0865     Serial #: [id number]     Status: active   Indications:  A-FLUTTER WTH ABLATION   PPM Follow Up Remote Check?  No Battery Voltage:  2.95 V     Pacer Dependent:  No       PPM Device Measurements Atrium  Amplitude: 1.3 mV, Impedance: 416 ohms,  Right Ventricle  Amplitude: 2.8 mV, Impedance: 560 ohms, Threshold: 0.5 V at 0.4 msec  Episodes MS Episodes:  1     Percent Mode Switch:  100%     Coumadin:  No Atrial Pacing:  2.3%      Ventricular Pacing:  17.7%  Parameters Mode:  DDIR     Lower Rate Limit:  60     Upper Rate Limit:  130 Paced AV Delay:  180     Tech Comments:  No parameter changes.   Altha Harm, LPN  July 19, 2009 1:23 PM   Impression & Recommendations:  Problem # 1:  SHORTNESS OF BREATH (ICD-786.05) the patient is quite short of breath. He has significant fluid overload. His creatinine is worse at 6.2. The question will be high we remove fluid. Does he require dialysis. I have spoken with the  nephrologist from [facility]. They will see him this afternoon and make a decision as to whether urgent dialysis is indicated or whether his fluids can be managed with diuresis.  Problem # 2:  RENAL INSUFFICIENCY, CHRONIC (ICD-585.9) His creatinine is as above. It is important to appreciate that as dialysis becomes imminent, that long-term transcutaneous dialysis is fraught with the potential for device infection. AV graft is far preferable we thinlk in infection risk. I am very appreciated at the willingness of the nephrology team to see him urgently this afternoon. Given his sleepiness I wonder whether he has become uremic.  Problem # 3:  ATRIAL FIBRILLATION (ICD-427.31) His atrial fibrillation is permanent. For reasons I do not remember he does not take Coumadin. His Plavix is currently on hold per pain management, that is a reasonable at this point. All activeA. suggests  that there is benefit from Plavix to aspirin for thrombolic risk reduction at benefit is modest  His updated medication list for this problem includes:    Plavix 75 Mg Tabs (Clopidogrel bisulfate) .Marland Kitchen... Take one tablet by mouth daily - on hold per pain manganement    Carvedilol 12.5 Mg Tabs (Carvedilol) .Marland Kitchen... 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 Consideration should be given to the use of hydralazine and nitrates. His updated medication list  for this problem includes:    Plavix 75 Mg Tabs (Clopidogrel bisulfate) .Marland Kitchen... Take one tablet by mouth daily - on hold per pain manganement    Carvedilol 12.5 Mg Tabs (Carvedilol) .Marland Kitchen... Take one tablet by mouth twice a day    Amlodipine Besylate 5 Mg Tabs (Amlodipine besylate) .Marland Kitchen... Take one tablet by mouth daily    Furosemide 40 Mg Tabs (Furosemide) .Marland Kitchen... Take one tablet by mouth twice daily  His updated medication list for this problem includes:    Plavix 75 Mg Tabs (Clopidogrel bisulfate) .Marland Kitchen... Take one tablet by mouth daily - on hold per pain manganement    Carvedilol 12.5 Mg Tabs (Carvedilol) .Marland Kitchen... Take one tablet by mouth twice a day    Amlodipine Besylate 5 Mg Tabs (Amlodipine besylate) .Marland Kitchen... Take one tablet by mouth daily    Furosemide 40 Mg Tabs (Furosemide) .Marland Kitchen... Take one tablet by mouth twice daily  Cardiovascular Risk Assessment/Plan:      The patient's hypertensive risk group is category C: Target organ damage and/or diabetes.  Today's blood pressure is 164/85.

## 2010-07-02 NOTE — Letter (Signed)
Summary: Remote Device Check  [employer], Main Office  [address]   [place], [place] [address]   Phone: [phone]  Fax: [phone]     January 21, 2010 MRN: [id number]   [facility] [address] [place], [place]  [address]   Dear Mr. TRULOCK,   Your remote transmission was recieved and reviewed by your physician.  All diagnostics were within normal limits for you.  __X___Your next transmission is scheduled for:  04-18-2010.  Please transmit at any time this day.  If you have a wireless device your transmission will be sent automatically.   Sincerely,  Vella Kohler

## 2010-07-02 NOTE — Assessment & Plan Note (Signed)
Summary: ROV/AMD  Medications Added GLIPIZIDE 2.5 MG XR24H-TAB (GLIPIZIDE) by mouth daily as needed      Allergies Added:   Visit Type:  Follow-up Primary Provider:  michael Blocker  CC:  Denies chest pain or shortness of breath.  Was at [facility] with passing out spells..  History of Present Illness: Mr. Livsey returns today for PPM followup.  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  He also has HTN, DM and was recently hospitalized with a syncopal episode where his PPM was evaluated demonstrating no arrhythmias.  He r/o for an MI and was found to have evidence of orthostasis.  The patient also has ESRD and is on dialysis. No other complaints except that he is still getting dizzy when he stands up, particularly after HD.  Current Medications (verified): 1)  Amitriptyline Hcl 50 Mg Tabs (Amitriptyline Hcl) .... By Mouth Daily At Bedtime 2)  Plavix 75 Mg Tabs (Clopidogrel Bisulfate) .... Take One Tablet By Mouth Daily - On Hold Per Pain Manganement 3)  Avodart 0.5 Mg Caps (Dutasteride) .... By Mouth Weekly 4)  Carvedilol 12.5 Mg Tabs (Carvedilol) .... Take One Tablet By Mouth Twice A Day 5)  Amlodipine Besylate 5 Mg Tabs (Amlodipine Besylate) .... Take One Tablet By Mouth Daily 6)  Levoxyl 125 Mcg Tabs (Levothyroxine Sodium) .... By Mouth Daily 7)  Furosemide 40 Mg Tabs (Furosemide) .... Take One Tablet By Mouth Twice Daily 8)  Carbidopa-Levodopa Cr 25-100 Mg Cr-Tabs (Carbidopa-Levodopa) .... By Mouth Daily At Noon 9)  Proventil Hfa 108 (90 Base) Mcg/act Aers (Albuterol Sulfate) .... As Directed 10)  Glipizide 2.5 Mg Xr24h-Tab (Glipizide) .... By Mouth Daily As Needed 11)  Procrit 3000 Unit/ml Soln (Epoetin Alfa) .... 2inj  X2 Weeks 12)  Coq10 100 Mg Caps (Coenzyme Q10) .Marland Kitchen.. 1 By Mouth Three Times A Day 13)  Fish Oil 1200 Mg Caps (Omega-3 Fatty Acids) .... By Mouth Daily 14)  Vitamin D3 1000 Unit Caps (Cholecalciferol) .... By Mouth Dail;y 15)  B Complex  Tabs (B Complex  Vitamins) .... By Mouth Daily 16)  Centrum Silver Ultra Mens  Tabs (Multiple Vitamins-Minerals) .... By Mouth Daily 17)  Tandem F 162-115.2-1 Mg Caps (Ferrous Fum-Iron Polysacch-Fa) .Marland Kitchen.. 1 Tab By Mouth Daily  Allergies (verified): 1)  ! Cipro 2)  ! Morphine 3)  ! Niacin  Past History:  Past Medical History: Last updated: 11/09/2008 CAD, UNSPECIFIED SITE (ICD-414.00) CAROTID ARTERY DISEASE (ICD-433.10) PVD (ICD-443.9) BRADYCARDIA (ICD-427.89) ATRIAL FIBRILLATION (ICD-427.31) PACEMAKER (ICD-V45.Marland Kitchen01)    Past Surgical History: Last updated: 11/09/2008 CABG Tonsillectomy Bladder pacemaker -  Medtronic N-rhythm [id number] -  1/24/6  Social History: Last updated: 11/09/2008 Retired  Married  Tobacco Use - Former.  '03 Alcohol Use - no Regular Exercise - yes Drug Use - no  Risk Factors: Exercise: yes (11/09/2008)  Risk Factors: Smoking Status: quit (11/09/2008)  Review of Systems       The patient complains of syncope.  The patient denies chest pain, dyspnea on exertion, and peripheral edema.    Vital Signs:  Patient profile:   70 year old male Height:      72 inches Pulse rate:   86 / minute BP sitting:   92 / 48  (left arm) Cuff size:   large  Vitals Entered By: Bishop Dublin, CMA (February 15, 2010 12:24 PM)  Physical Exam  General:  The patient was alert and oriented in no acute distress. HEENT Normal.  Neck veins were 7-8 cm  carotids  were brisk.  Lungs bibasilar crackles Heart sounds were regular with 2/6 m Abdomen was soft with active bowel sounds. There is no clubbing cyanosis 3+ edema Skin Warm and dry    PPM Specifications Following MD:  Sherryl Manges, MD     PPM Vendor:  Medtronic     PPM Model Number:  [id number]     PPM Serial Number:  [id number] PPM DOI:  06/25/2004     PPM Implanting MD:  Sherryl Manges, MD  Lead 1    Location: RA     DOI: 06/25/2004     Model #: [id number]     Serial #: [id number]     Status: active Lead 2    Location: RV     DOI:  06/25/2004     Model #: [id number]     Serial #: [id number]     Status: active  Magnet Response Rate:  BOL 85 ERI 65  Indications:  A-FLUTTER WTH ABLATION   PPM Follow Up Remote Check?  No Battery Voltage:  2.93 V     Pacer Dependent:  No       PPM Device Measurements Atrium  Amplitude: 1.5 mV, Impedance: 384 ohms,  Right Ventricle  Amplitude: 2.8 mV, Impedance: 664 ohms, Threshold: 1.0 V at 0.4 msec  Episodes Percent Mode Switch:  100%     Coumadin:  No Atrial Pacing:  2.3%     Ventricular Pacing:  20.7%  Parameters Mode:  DDIR     Lower Rate Limit:  60     Upper Rate Limit:  130 Paced AV Delay:  180     Next Remote Date:  05/16/2010     Next Cardiology Appt Due:  02/01/2011 Tech Comments:  No parameter changes. R-waves 2.39mV chronic.  A-fib with controlled ventricular rates, +plavix.  Carelink transmissions every 3 months.  ROV 1 year with Dr. Graciela Husbands in [place]. Altha Harm, LPN  February 15, 2010 12:40 PM  MD Comments:  Agree with above.  Impression & Recommendations:  Problem # 1:  PACEMAKER MDT -VVIR (ICD-V45.Marland Kitchen01) His device is working normally.  R waves are chronically low but stable.  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  He wil continue his meds as below. The following medications were removed from the medication list:    Furosemide 40 Mg Tabs (Furosemide) .Marland Kitchen... Take one tablet by mouth twice daily His updated medication list for this problem includes:    Plavix 75 Mg Tabs (Clopidogrel bisulfate) .Marland Kitchen... Take one tablet by mouth daily - on hold per pain manganement    Carvedilol 12.5 Mg Tabs (Carvedilol) .Marland Kitchen... Take one tablet by mouth twice a day    Amlodipine Besylate 5 Mg Tabs (Amlodipine besylate) .Marland Kitchen... Take one tablet by mouth daily  Problem # 3:  RENAL INSUFFICIENCY, CHRONIC (ICD-585.9) He is currently undergoing HD but still makes some urine.  I have asked him to hold his lasix.  Problem # 4:  ATRIAL  FIBRILLATION (ICD-427.31) His ventricular rate is well controlled. Because of his h/o syncope, I will ask him to stop his coreg. His updated medication list for this problem includes:    Plavix 75 Mg Tabs (Clopidogrel bisulfate) .Marland Kitchen... Take one tablet by mouth daily - on hold per pain manganement    Carvedilol 12.5 Mg Tabs (Carvedilol) .Marland Kitchen... Take one tablet by mouth twice a day  Patient Instructions: 1)  Your physician recommends that you schedule a follow-up appointment in: NOVEMBER with  Dr. Graciela Husbands 2)  Your physician has recommended you make the following change in your medication: STOP furosemide

## 2010-07-02 NOTE — Assessment & Plan Note (Signed)
Summary: ROV/AMD  Medications Added FUROSEMIDE 40 MG TABS (FUROSEMIDE) Take one tablet by mouth twice daily      Allergies Added:   Visit Type:  rov Primary Provider:  michael Blocker  CC:  cant get breath, sob, stomach pains, and and alot of gas.  History of Present Illness: 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  an echo showed EF in the lower 40s.  Over the past two weeks, he has experienced increasing dyspnea with exertion and abdominal swelling.  He denies c/p or peripheral edema. He has PND and orthopnea. No syncope.   Current Medications (verified): 1)  Amitriptyline Hcl 50 Mg Tabs (Amitriptyline Hcl) .... By Mouth Daily At Bedtime 2)  Plavix 75 Mg Tabs (Clopidogrel Bisulfate) .... Take One Tablet By Mouth Daily 3)  Avodart 0.5 Mg Caps (Dutasteride) .... By Mouth Weekly 4)  Carvedilol 12.5 Mg Tabs (Carvedilol) .... Take One Tablet By Mouth Twice A Day 5)  Advair Diskus 100-50 Mcg/dose Aepb (Fluticasone-Salmeterol) .... As Directed 6)  Amlodipine Besylate 5 Mg Tabs (Amlodipine Besylate) .... Take One Tablet By Mouth Daily 7)  Levoxyl 125 Mcg Tabs (Levothyroxine Sodium) .... By Mouth Daily 8)  Furosemide 40 Mg Tabs (Furosemide) .... Take One Tablet By Mouth Twice Daily 9)  Carbidopa-Levodopa Cr 25-100 Mg Cr-Tabs (Carbidopa-Levodopa) .... By Mouth Daily At Noon 10)  Sodium Bicarbonate 650 Mg Tabs (Sodium Bicarbonate) .... 4x Daily 11)  Proventil Hfa 108 (90 Base) Mcg/act Aers (Albuterol Sulfate) .... As Directed 12)  Glipizide 2.5 Mg Xr24h-Tab (Glipizide) .... By Mouth Daily 13)  Procrit 3000 Unit/ml Soln (Epoetin Alfa) .... 2inj  X2 Weeks 14)  Coq10 100 Mg Caps (Coenzyme Q10) .Marland Kitchen.. 1 By Mouth  Three Times A Day 15)  Fish Oil 1200 Mg Caps (Omega-3 Fatty Acids) .... By Mouth Daily 16)  Vitamin D3 1000 Unit Caps (Cholecalciferol) .... By Mouth Dail;y 17)  B Complex  Tabs (B Complex Vitamins) .... By Mouth Daily 18)  Centrum Silver Ultra Mens  Tabs (Multiple Vitamins-Minerals) .... By Mouth Daily 19)  Tandem F 162-115.2-1 Mg Caps (Ferrous Fum-Iron Polysacch-Fa) .Marland Kitchen.. 1 Tab By Mouth Daily  Allergies (verified): 1)  ! Cipro 2)  ! Morphine 3)  ! Niacin  Past History:  Past Medical History: Last updated: 11/09/2008 CAD, UNSPECIFIED SITE (ICD-414.00) CAROTID ARTERY DISEASE (ICD-433.10) PVD (ICD-443.9) BRADYCARDIA (ICD-427.89) ATRIAL FIBRILLATION (ICD-427.31) PACEMAKER (ICD-V45.Marland Kitchen01)    Past Surgical History: Last updated: 11/09/2008 CABG Tonsillectomy Bladder pacemaker -  Medtronic N-rhythm [id number] -  1/24/6  Review of Systems       The patient complains of dyspnea on exertion.  The patient denies chest pain, syncope, and peripheral edema.    Vital Signs:  Patient profile:   70 year old male Height:      72 inches Weight:      238 pounds BMI:     32.40 Pulse rate:   81 / minute Pulse rhythm:   regular BP sitting:   140 / 72  (left arm) Cuff size:   large  Vitals Entered By: Mercer Pod (July 09, 2009 10:35 AM)  Physical Exam  General:  The patient was alert and oriented in no acute distress. HEENT Normal.  Neck veins were  7 cm., carotids were brisk.  Lungs were clear except for basilar rales. No wheezes or rhonchi. Heart sounds were irregular without murmurs or gallops.  Abdomen was soft with active bowel sounds. Protuberant. There is no clubbing cyanosis or edema. Skin Warm and dry    New Orders:     1)  Echocardiogram (Echo)   EKG  Procedure date:  07/09/2009  Findings:      Atrial fibrillation with a controlled ventricular response rate of: 81.Right bundle branch block.    CXR  Procedure date:  07/02/2009  Findings:       Pulmonary vascular congestion.  PPM Specifications Following MD:  Sherryl Manges, MD     PPM Vendor:  Medtronic     PPM Model Number:  [id number]     PPM Serial Number:  [id number] [facility] PPM DOI:  06/25/2004     PPM Implanting MD:  Sherryl Manges, MD  Lead 1    Location: RA     DOI: 06/25/2004     Model #: 6213     Serial #: [id number]     Status: active Lead 2    Location: RV     DOI: 06/25/2004     Model #: 6295     Serial #: [id number]     Status: active   Indications:  A-FLUTTER WTH ABLATION   PPM Follow Up Remote Check?  No Battery Voltage:  2.95 V     Pacer Dependent:  No       PPM Device Measurements Atrium  Amplitude: 1.3 mV, Impedance: 424 ohms,  Right Ventricle  Amplitude: 3.1 mV, Impedance: 568 ohms, Threshold: 1.0 V at 0.4 msec  Episodes MS Episodes:  1     Percent Mode Switch:  100%     Coumadin:  No Atrial Pacing:  2.9%     Ventricular Pacing:  18.1%  Parameters Mode:  DDIR     Lower Rate Limit:  60     Upper Rate Limit:  130 Paced AV Delay:  180     Next Remote Date:  10/10/2009     Tech Comments:  No parameter changes.  Device function normal.  R-waves 3.1 chronic.  A-fib, + plavix.  Carelink transmissions every 3 months. Altha Harm, LPN  July 09, 2009 10:46 AM  MD Comments:  Agree with above.  Impression & Recommendations:  Problem # 1:  SHORTNESS OF BREATH (ICD-786.05) I suspect he has some evidence of volume overload based on his exam and cxr.  I have asked him to increase his lasix for a couple of days and see Dr. Graciela Husbands back next week with a BMP on arrival.  A repeat echo would be a consideration and I will order but defer to Dr. Graciela Husbands. A low sodium diet is recommended. His ECG is non-acute. The following medications were removed from the medication list:    Metoprolol Succinate 25 Mg Xr24h-tab (Metoprolol succinate) .Marland Kitchen... Take one tablet by mouth daily His updated medication list for this problem includes:    Carvedilol 12.5 Mg Tabs (Carvedilol) .Marland Kitchen... Take one  tablet by mouth twice a day    Amlodipine Besylate 5 Mg Tabs (Amlodipine besylate) .Marland Kitchen... Take one tablet by mouth daily    Furosemide 40 Mg Tabs (Furosemide) .Marland Kitchen... Take one tablet by mouth twice daily  Orders: Echocardiogram (Echo)  Problem # 2:  PACEMAKER MDT -VVIR (ICD-V45.Marland Kitchen01) His device is programmed DDIR and appears to be working normally.  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 The following medications were removed from the medication list:    Metoprolol Succinate 25 Mg Xr24h-tab (Metoprolol succinate) .Marland Kitchen... Take one tablet by mouth daily His updated medication list for this problem includes:    Plavix 75 Mg Tabs (Clopidogrel bisulfate) .Marland Kitchen... Take one tablet by mouth daily    Carvedilol 12.5 Mg Tabs (Carvedilol) .Marland Kitchen... Take one tablet by mouth twice a day    Amlodipine Besylate 5 Mg Tabs (Amlodipine besylate) .Marland Kitchen... Take one tablet by mouth daily    Furosemide 40 Mg Tabs (Furosemide) .Marland Kitchen... Take one tablet by mouth twice daily  Problem # 4:  RENAL INSUFFICIENCY, CHRONIC (ICD-585.9) Will repeat his BMP after 3 days of increased lasix.   Patient Instructions: 1)  Your physician recommends that you schedule a follow-up appointment in: 1 week with Dr. Graciela Husbands 2)  Your physician recommends that you return for lab work in: same day as appt with Dr. Graciela Husbands (bmet, bnp) 3)  Your physician has recommended you make the following change in your medication: increase lasix to 2 tablets twice a day for 3 days  4)  Your physician has requested that you have an echocardiogram.  Echocardiography is a painless test that uses sound waves to create images of your heart. It provides your doctor with information about the size and shape of your heart and how well your heart's chambers and valves are working.  This procedure takes approximately one hour. There are  no restrictions for this procedure.

## 2010-07-02 NOTE — Miscellaneous (Signed)
Summary: dx correction   Clinical Lists Changes  Problems: Changed problem from PACEMAKER MDT -VVIR (ICD-V45.Marland Kitchen01) to PACEMAKER, PERMANENT (ICD-V45.01) changed the incorrect dx code to correct dx code

## 2010-07-02 NOTE — Letter (Signed)
Summary: [facility] - DXR Kidney Ureter Bladder  [facility] - DXR Kidney Ureter Bladder   Imported By: Marylou Mccoy 03/04/2010 13:26:51  _____________________________________________________________________  External Attachment:    Type:   Image     Comment:   External Document

## 2010-07-02 NOTE — Letter (Signed)
Summary: [facility] - Discharge Summary  [facility] - Discharge Summary   Imported By: Marylou Mccoy 03/04/2010 13:25:46  _____________________________________________________________________  External Attachment:    Type:   Image     Comment:   External Document

## 2010-07-18 ENCOUNTER — Encounter (INDEPENDENT_AMBULATORY_CARE_PROVIDER_SITE_OTHER): Payer: Self-pay | Admitting: *Deleted

## 2010-07-24 ENCOUNTER — Encounter (INDEPENDENT_AMBULATORY_CARE_PROVIDER_SITE_OTHER): Payer: Medicare Other

## 2010-07-24 ENCOUNTER — Encounter: Payer: Self-pay | Admitting: Internal Medicine

## 2010-07-24 DIAGNOSIS — I495 Sick sinus syndrome: Secondary | ICD-10-CM

## 2010-07-24 NOTE — Letter (Signed)
Summary: Device[employer]Delinqu[employer] Phone Journalist, newspaper, Main Office  [address]   [place], [place] [address]   Phone: [phone]  Fax: [phone]     July 18, 2010 MRN: [id number]   [facility] [address] [place], [place]  [address]   Dear Robert [name],  According to our records, you were scheduled for a device phone transmission on 07-11-10.     We did not receive any results from this check.  If you transmitted on your scheduled day, please call us to help troubleshoot your system.  If you forgot to send your transmission, please send one upon receipt of this letter.  Thank you,   Architectural technologist [facility]

## 2010-08-19 ENCOUNTER — Encounter: Payer: Self-pay | Admitting: *Deleted

## 2010-08-29 NOTE — Letter (Signed)
Summary: Remote Device Check  [employer], Main Office  [address]   [place], [place] [address]   Phone: [phone]  Fax: [phone]     August 19, 2010 MRN: [id number]   [facility] [address] [place], [place]  [address]   Dear Robert [name],   Your remote transmission was recieved and reviewed by your physician.  All diagnostics were within normal limits for you.  __X___Your next transmission is scheduled for:   10-24-2010.  Please transmit at any time this day.  If you have a wireless device your transmission will be sent automatically.  Sincerely,  Vella Kohler

## 2010-08-29 NOTE — Cardiovascular Report (Signed)
Summary: Office Visit   Office Visit   Imported By: Roderic Ovens 08/21/2010 09:41:38  _____________________________________________________________________  External Attachment:    Type:   Image     Comment:   External Document

## 2010-09-09 ENCOUNTER — Ambulatory Visit: Payer: Medicare Other | Admitting: Otolaryngology

## 2010-09-23 ENCOUNTER — Ambulatory Visit: Payer: Medicare Other | Admitting: Otolaryngology

## 2010-09-25 ENCOUNTER — Ambulatory Visit: Payer: Medicare Other | Admitting: Otolaryngology

## 2010-09-26 ENCOUNTER — Inpatient Hospital Stay: Payer: Medicare Other | Admitting: Otolaryngology

## 2010-09-26 LAB — PATHOLOGY REPORT

## 2010-10-15 NOTE — Letter (Signed)
June 19, 2008    Orson Aloe, MD  [address], [address]  [place], [place] [address]   RE:  [name], Robert  MRN:  [id number]  /  DOB:  March 15, 1941   Dear Dr. Leavy Cella:   Mr. Savas came in today in followup for atrial fibrillation that  is permanent bradycardia status post pacemaker implantation and a  variety of other issues.  He has a history of coronary disease.  He is  status post surgical revascularization.  He has carotid disease as well  as peripheral vascular disease and see Dr. Earnestine Leys for this.   He had problems with his history of atrial arrhythmias, hypertension,  and age.  Coumadin therapy was appropriately initiated.  It was  subsequently stopped because of bleeding and he is now being treated  with Plavix.  LV dysfunction previously identified in the 40% range was  normal as of an echo in August 2009 with mild left atrial enlargement  and otherwise parameters were largely normal apart from mild transaortic  valve gradient.   He then relates a story of that has ended well.  He was having problems  with breathing.  He was put on inhalers.  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   I am impressed that his medication list which is complicated and long.  Antihypertensives include enalapril a dose of 5 and Norvasc a dose of 5.  He also takes 2 beta-blockers, which I put him on for trying to maintain  rate control, Toprol 25 mg a day using at low dose because of fatigue  and carvedilol 12.5 b.i.d. give his cardiomyopathy.  He takes the  Avodart now just once a week.  He is on amitriptyline 50 mg, Plavix 75,  Levoxyl, furosemide 40 b.i.d., Sinemet 25/100 b.i.d.   On examination, his blood pressure today is 143/80, his pulse was 76,  his weight was 230  pounds, which is stable over the last 6 months and  down 25 pounds over the last 3 years.  His neck veins were flat.  His  lungs were clear.  Heart sounds were regular with 2/6 murmur.  The  abdomen was protuberant.  The extremities had no edema.   Interrogation of his Medtronic and rhythm pulse generator demonstrates a  fibrillation wave of 1.2 with impedance of 520.  The R-wave is 2.4,  which is chronically low.  Impedance was 576, a threshold 0.5 and 0.4.  Heart rate excursion is much better.   IMPRESSION:  1. Atrial fibrillation - permanent with improved ventricular response.  2. Bradycardia status post Medtronic pacemaker.  3. Coronary artery disease.      a.     Status post coronary artery bypass grafting.      b.     Previous lead depressed left ventricular function, now       normal.  4. Peripheral vascular disease.  5. Thromboembolic risk factors notable for;      a.     Age.      b.     Hypertension.      c.     Vascular disease.  6. Completed on Coumadin.   Mr. Conley is doing really well from arrhythmia point of view.  I  think, his heart rate excursion is adequate.  His medication  regime is  quite complicated.  I wonder whether consolidating his medicines would  help.  He is on enalapril, Norvasc, and low dose carvedilol.  My vote  would be, if you think we can, to increase his carvedilol, stop his  Norvasc, and keep his ACE inhibitor on board for its secondary benefits.   The other question I would have is whether he could take aspirin instead  of Plavix.  I do not know that we have been updated that Plavix by  itself is significantly better than aspirin either for his coronary  disease or his atrial fibrillation.   We will plan to see him again in 6 months' time.  Thanks very much for  allowing [place] to see him.    Sincerely,      Duke Salvia, MD, [facility]  Electronically Signed    SCK/MedQ  DD: 06/19/2008  DT: 06/20/2008  Job #: [id number]   CC:    Tora Kindred, MD  Lovie Chol, MD,@ [facility]  Benita Gutter, MD

## 2010-10-15 NOTE — Letter (Signed)
December 27, 2007    Dolphus Jenny, MD  [address]  [place], [place]  [address]   RE:  Robert [name], Robert [name]  MRN:  [id number]  /  DOB:  02/14/1941   Dear Claris Che:   It was a pleasure hearing your name today after so many years.   Mr. Watkinson presented to our office to establish cardiology care.  I  had seen him 3 or 4 years ago in consultation for Dr. Jamse Mead for  atrial flutter for which he underwent catheter ablation.  He also had  chronotropic incompetence.  He underwent pacemaker implantation.  At  that time, his ejection fraction was about 40-45%, so ICD implantation  was not recommended.  I should note that he had an antecedent bypass  surgery in 2003.   When I saw him following his pacemaker implantation, he was feeling a  good deal better.   He now comes in with a series of complaints.  These include dizziness,  which is a rather old complaint actually noting it from our records from  2006.  This is provoked by bending or by sitting up quickly.   His second major complaint is being tired.  This has been going on for a  couple of months, and he dates it to when his Toprol dose was doubled.  It is associated with fatigue, but no change in his exercise capacity.   He has also been treated by Dr. Lorre Nick for anemia for some time.  He  gets Procrit as well as iron.  There has been some GI blood loss, which  in fact prompted the discontinuation of his Coumadin and the treatment  with Plavix in conjunction with aspirin and this has been for a  thromboembolic risk reduction and his atrial fibrillation.   He also has chronic renal insufficiency for which you follow him and  hypertension.  He uses nocturnal oxygen for his sleep apnea.   His past medical history in addition to the above is notable for.  1. GE reflux.  2. Bladder cancer with subsequent surgery.  3. Graves disease treated with radioactive ablation.   His medication list is legion and includes Flomax  0.4, Plavix 75,  Avodart 0.5 every other day, Norvasc 5, Toprol 100, Levoxyl 0.125,  furosemide 80 b.i.d., carbidopa and levodopa 25/200 two tablets twice a  day, sodium bicarb 625 four tablets q.i.d., Atarax, and Procrit.   She is also on enalapril at 5 mg a day.   He is allergic to MORPHINE and CIPRO.   He comes in today with his wife.  He is not using cigarettes or alcohol.   His review of systems is broadly positive as noted previously.   On examination today, his blood pressure is 136/72 with a pulse of 64.  There is a significant change with standing with a recorded heart rate  of 134 with a blood pressure of 119/73 accompanied by some  lightheadedness.  His HEENT exam demonstrated no icterus or xanthoma.  His neck veins were 6-7 cm.  His carotids were brisk.  His back was  without kyphosis or scoliosis.  Lungs were clear.  Heart sounds were  irregular with a 2/6 murmur heard at the apex.  The abdomen was soft  with active bowel sounds.  Femoral pulses were trace.  Distal pulses  were 2+.  There was no clubbing or cyanosis.  There was trace edema.  Neurological exam was grossly normal.  His skin was warm and dry.  Electrocardiogram dated today demonstrated atrial fibrillation to 103  with intermittent ventricular pacing.  The intrinsic QRS durations were  approximately 100 and 10-20 milliseconds.   Interrogation of his Medtronic and rhythm pulse generator demonstrates a  P-wave of 1.6 with an impedance of 512.  The R-wave was 2.4 with an  impedance of 608 at threshold of 0.5 and 0.4.  Battery voltage was 2.96  and ventricular pacing was 25%.   IMPRESSION:  1. 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  2. Paroxysmal atrial fibrillation, now permanent with a modestly rapid      ventricular response with about 15% of his heart beats faster than      100.  3. Profound and worsening fatigue concurrent in his mind  with the up-      titration of his Toprol.  4. Hypertension.  5. Renal insufficiency, question severity.  6. Treated hyperthyroidism, now hypothyroid on Synthroid replacement.  7. Bradycardia, status post pacemaker implantation  8. Orthostatic intolerance with documented fall in blood pressure and      increase in heart rate.   DISCUSSION:  Claris Che, Mr. Carneiro pacemaker is doing fine.  It  tells [place] that there is some degree of rapid ventricular response, which  may be contributing to his fatigue.  The other issue is going to be how  do we maintain control of this ventricular response as we adjust his  medication, specifically his Toprol, which he has implicated as the  major culprit in his progressive fatigue.  From a cardiomyopathy point  of view, the bisoprolol or carvedilol would be reasonable to turn this,  and so I have taken the liberty of starting him on carvedilol 12.5  b.i.d. with further up-titration to be pursued as either you or I can  towards the target of 25.  I have maintained him on a low dose of  metoprolol succinate at 25 mg, as my impression is that it has better  rate control and that is a secondary need that we have for his beta  blockers.   As related to his blood pressure, I wonder whether there is some  latitude that you might feel that we have in terms of down-titrating his  antihypertensives to try to ameliorate some of his orthostasis.  I will  defer that to your expertise.   The other concern that I had is whether his fatigue is a manifestation  of more primary cardiac disease and so we will plan to ultrasound him to  see what his EF is doing.  In the event that it has significantly  changed, Myoview scanning would be appropriate.   If you would not mind keeping me abreast of his laboratory information  that would be helpful, and I will do my best to keep you and Dr. Lorre Nick  up to date as to what we are thinking about from his heart point of   view.    Sincerely,      Duke Salvia, MD, [facility]  Electronically Signed    SCK/MedQ  DD: 12/27/2007  DT: 12/28/2007  Job #: [id number]   CC:    Benita Gutter, MD

## 2010-10-18 NOTE — Op Note (Signed)
NAME:  Robert [name], Robert [name] NO.:  [id number]   MEDICAL RECORD NO.:  [id number]          PATIENT TYPE:  INP   LOCATION:  4742                         FACILITY:  [facility]   PHYSICIAN:  Duke Salvia, M.D.  DATE OF BIRTH:  09/10/1940   DATE OF PROCEDURE:  06/25/2004  DATE OF DISCHARGE:                                 OPERATIVE REPORT   PREOPERATIVE DIAGNOSIS:  Symptomatic sinus node dysfunction.   POSTOPERATIVE DIAGNOSIS:  Symptomatic sinus node dysfunction.   PROCEDURE:  Dual chamber pacemaker implantation.   DESCRIPTION OF PROCEDURE:  Following obtaining informed consent, the patient  had been prepared for pacemaker implantation.  Previously undertaken  electrophysiological study failed to identify ventricular tachycardia  notwithstanding the patient's prior myocardial infarction history and with  his ejection fraction of 48%, this was felt to be adequately sensitive.   An incision was made underneath the left collar bone in the prepectoral  subclavicular region and incision was made and carried down to the layer of  the prepectoral fascia using electrocautery and sharp dissection.  A pocket  was formed similarly, hemostasis was obtained.   Thereafter, attention was turned to gaining access to the extrathoracic left  subclavian vein which was accomplished without difficulty and without the  aspiration of air or puncture of the artery.  Two separate venipunctures  were accomplished.  Guide wires were placed and retained and 7 French tear-  away introducer sheaths were placed through which were passed sequentially a  St. Jude passive fixation ventricular lead which was subsequently  repositioned on one occasion and had to be removed because of inability to  maintain stable R waves.  Ultimately, the axis was retained using a slit in  the insulation of the to-be-extracted lead, allowing for maintaining guide  wire through the same venipuncture port.  A 7 French sheath  was replaced and  then a Medtronic 5076 58 cm active fixation ventricular lead serial  #[id number] lead was passed under fluoroscopic guidance to the right  ventricular apex where the bipolar R wave was 8.6 millivolts with a pacing  impedance of 807 ohms and a threshold of 0.8 volts at 0.5 milliseconds.  Current threshold was 1.2 MA and there was no diaphragmatic pacing at 10  volts.   Multiple sites were used to map the atrium with a Medtronic 5076 52 cm lead  serial #[id number].  Initially sites in the right atrial appendage were  mapped where amplitudes were quite adequate, but thresholds were also 4  volts.  We then found a site in the low right atrium where the bipolar P  wave was 5 millivolts, pacing impedance of 565 ohms, and threshold of 1 volt  at 0.5 milliseconds.  Current threshold was 2.3 MA and there was no  diaphragmatic pacing at 10 volts.  With these acceptable parameters  recorded, the leads were secured to the prepectoral fascia and then attached  to a Medtronic N-rhythm [id number] pulse generator serial #[id number].  Presynchronous pacing was identified.  The pocket was copiously irrigated  with antibiotic-containing saline solution.  Hemostasis was assured.  The  leads and the pulse generator were  then placed in the pocket and secured to  the prepectoral fascia.  The wound  was washed, dried, and a Benzoin Steri-Strip dressing was applied.  Needle,  sponge, and instrument counts correct at the end of the procedure according  to the staff.  The patient tolerated the procedure without apparent  complications.      SCK/MEDQ  D:  06/27/2004  T:  06/27/2004  Job:  [id number]

## 2010-10-18 NOTE — Op Note (Signed)
Robert [name]. [facility]  [name]:    Robert [name] Visit Number: [id number] MRN: [id number]          Service Type: DSU Location: 3000 3020 01 Attending Physician:  Robert [name] Dictated by:   Robert [name], M.D. Proc. 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  PROCEDURE:  C5-6 and C6-7 anterior cervical diskectomy and fusion with allograft and anterior plating.  SURGEON:  Robert [name], M.D.  ASSISTANT:  Robert [name]., M.D.  ANESTHESIA:  General endotracheal.  INDICATION:  Robert [name] is a 70 year old male with history of chronic neck and bilateral upper extremity pain, paresthesias, and weakness consistent with a cervical myelopathy.  He also has some coexistent features of a cervical radiculopathy involving his right upper extremity, predominantly involving Robert right C6 and C7 nerve roots.  Robert [name] has failed conservative management, and MRI scanning demonstrates severe stenosis at Robert C5-6 and C6-7 levels.  Robert [name] presents now for a two-level anterior cervical diskectomy and fusion with allograft and anterior plating for hopeful improvement of symptoms.  DESCRIPTION OF PROCEDURE:  [name] taken to Robert operating room, placed on Robert operating table in a supine position.  After an adequate level of anesthesia achieved, Robert [name] was positioned supine with his neck slightly extended and held in place with Holter traction.  Robert patients anterior cervical region was prepped and draped sterilely.  A 10 blade was used to make a skin incision overlying Robert C6 level.  This was carried down sharply to Robert platysma.  Robert platysma was then divided vertically and dissection proceeded along Robert medial border of Robert sternomastoid muscle and carotid sheath on  Robert right.  Trachea and esophagus were mobilized toward Robert left.  Prevertebral fascia was stripped off Robert anterior spinal column.  Robert longus colli muscle was then elevated bilaterally using electrocautery.  Deep self-retaining retractors were placed.  Intraoperative fluoroscopy was used, and Robert level was confirmed.  Robert disk spaces at C5-6 and C6-7 were then incised with a 15 blade in rectangular fashion.  A wide disk space clean-out was then achieved using pituitary rongeurs, forward and backward-angled Karlin curettes, Kerrison rongeurs, and Robert high-speed drill.  All elements of Robert disk were removed down to Robert level of Robert posterior annulus at both levels.  Robert microscope was brought in Robert field and used for Robert remainder of Robert diskectomy.  Remaining aspect of Robert annulus and osteophytes were removed using Robert high-speed drill.  Posterior longitudinal ligament was then elevated and resected in piecemeal fashion using Kerrison rongeurs.  Robert underlying thecal sac was identified.  A wide central decompression was then performed. Once Robert spinal canal was widely decompressed, decompression then proceeded out to each C6 neural foramen.  Robert C6 nerve roots were identified proximally and widely decompressed throughout their foramina.  Once adequate decompression had been performed at C5-6, attention was then placed to C6-7, where once again an aggressive anterior decompression was performed, again without complication.  At this point Robert wound was then irrigated with antibiotic solution, and Gelfoam was placed topically for hemostasis, which was found to be good.  Robert disk spaces were then distracted, and 7 mm patellar wedge allografts were impacted  into place at each level.  A 45 mm Atlantis anterior cervical plate was then placed over Robert C5, C6, and C7 levels.  It was then attached under fluoroscopic guidance.  At C5, a 15 mm rescue screw was placed on Robert right, 14 mm  variable-angle screw on Robert left, two 14 mm screws were placed at C6 and C7.  All six screws given a final tightening and found to be solidly within bone.  Locking screws were engaged.  Robert retractor system was removed.  Final images revealed good position of Robert bone grafts and hardware, proper operative level, with normal alignment of Robert spine.  Robert wound was then irrigated one final time with antibiotic solution.  Hemostasis was ensured with bipolar electrocautery.  Robert wound was then closed in typical fashion.  Steri-Strips and a sterile dressing were applied.  There were no apparent complications.  Robert [name] tolerated Robert procedure well, and he returns to Robert recovery room postop. Dictated by:   Robert [name], M.D. Attending Physician:  Robert [name] DD:  03/17/01 TD:  03/17/01 Job: [id number]

## 2010-10-18 NOTE — Op Note (Signed)
[place]. [facility]  Patient:    Robert [name], Robert [name] Visit Number: [id number] MRN: [id number]          Service Type: SUR Location: 2300 2304 01 Attending Physician:  Waldo Laine Dictated by:   Gwenith Daily Tyrone Sage, M.D. Proc. Date: 11/02/01 Admit Date:  11/02/2001   CC:         Asencion Gowda, M.D.  Dr. Andrew Au, [facility]   Operative Report  PREOPERATIVE DIAGNOSIS:  Coronary occlusive disease, diabetes mellitus, proteinuria.  POSTOPERATIVE DIAGNOSIS:  Coronary occlusive disease, diabetes mellitus, proteinuria.  OPERATION PERFORMED:  Coronary artery bypass grafting times four with the left internal mammary artery to the left anterior descending coronary artery. Sequential reversed saphenous vein graft to the intermediate and circumflex coronary artery, reversed saphenous vein graft to the distal right coronary artery.  Endoscopic vein harvesting, right thigh.  SURGEON:  Gwenith Daily. Tyrone Sage, M.D.  FIRST ASSISTANT:  Mikey Bussing, M.D.  SECOND ASSISTANT:  Tollie Pizza. Thomasena Edis, P.A.  ANESTHESIA:  General.  INDICATIONS FOR PROCEDURE:  The patient is a 69 year old male who presents with symptoms of vague chest discomfort but mostly fatigue and shortness of breath.  He was seen by Dr. Johny Chess and a stress test was performed which was positive for ischemia.  He was then evaluated by Dr. Welton Flakes who performed a cardiac catheterization at the [facility] outpatient cardiac catheterization laboratory.  This catheterization revealed significant disease in the right coronary artery with greater than 80 to 90% and also 70 to 805 disease in the LAD.  The cath report mentioned the 30% left main distally; however, on the review of films I have a very definite concern that the patient has evidence of ostial left main disease and because of his positive stress test and three-vessel coronary artery disease, coronary artery bypass grafting  was recommended.  The circumflex also has at least a 60% lesion. The patient agreed and signed informed consent.  Preoperative laboratory revealed that the patient is diabetic with slightly elevated hemoglobin A1C and also has proteinuria.  Creatinine was normal.  DESCRIPTION OF PROCEDURE:  With Swan-Ganz and arterial line monitors in place, the patient underwent general endotracheal anesthesia without incident.  The skin of the chest and legs was prepped with Betadine and draped in the usual sterile manner.  Using a Guidant Endovein harvesting system, a segment of vein was harvested from the right thigh.  The vein was of good quality and caliber. A median sternotomy was performed and the left internal mammary artery was dissected down as a pedicle graft.  The distal artery was divided and had good free flow.  The pericardium was opened.  The patient had evidence of significant left ventricular hypertrophy.  The patient was systemically heparinized.  The ascending aorta and the right atrium were cannulated in the aortic root.  A bent cardioplegia needle was introduced into the ascending aorta.  The patient was placed on cardiopulmonary bypass at 2.4 L per minute per meter squared.  Sites for anastomosis were selected and dissected out of the epicardium.  The patients body temperature was then cooled to 30 degrees. The aortic crossclamp was applied.  500 cc of cold blood potassium cardioplegia was administered.  Attention was turned first to the distal right coronary artery which was opened and was a reasonable sized vessel 1.8 mm in size.  Using a running 7-0 Prolene and distal anastomosis was performed with a segment of reversed saphenous vein graft.  Attention was then  turned to the intermediate coronary artery and the circumflex coronary artery.  Both of these were intramyocardial.  They were each opened and were reasonable sized vessels, each admitting a 1.5 mm probe.  Using a  segment of reversed saphenous vein graft, first a side-to-side anastomosis was carried out with a running 7-0 Prolene with the intermediate coronary artery.  The distal extent of the same vein was then carried a short distance to the circumflex coronary artery and a distal anastomosis was performed.  Attention was then turned to the left anterior descending coronary artery which was intramyocardial for the proximal two thirds of the vessel.  The distal third was opened, was slightly smaller than his other vessels but admitted a 1.5 mm probe proximally and distally. Using a running 8-0 Prolene, the left internal mammary artery was anastomosed to the left anterior descending coronary artery.  With release of the Edwards bulldog on the mammary artery, there was appropriate rise in myocardial septal temperature.  The aortic cross clamp was removed.  Total cross clamp time was 65 minutes.  A partial occlusion clamp was placed on the ascending aorta.  Two punch aortotomies were performed. Each of the two vein grafts were anastomosed to the ascending aorta.  Air was evacuated from the grafts.  The partial occlusion clamp was removed.  Sites of anastomosis were inspected and free of bleeding.  The patient was then ventilated and weaned from cardiopulmonary bypass without difficulty.  She remained hemodynamically stable and was decannulated in the usual fashion. Protamine sulfate was administered.  With the operative field hemostatic, two atrial and two ventricular pacing wires were applied.  Graft markers were applied.  A left pleural tube was placed.  The sternum was closed with #6 stainless steel wire, the fascia was closed with interrupted 0 Vicryl and running 3-0 Vicryl in the subcutaneous tissue and 4-0 subcuticular stitch in the skin edges.  Dry dressings were applied.  The sponge and needle counts were reported as correct at completion of the procedure.  The patient  tolerated the  procedure without obvious complication and was transferred to the surgical intensive care unit.  On arrival in the surgical intensive care unit the patient went into a rapid atrial fibrillation with his blood pressure dropping to 80.  He was loaded with amiodarone and required multiple cardioversions which would return him to sinus but after a few minutes, atrial fibrillation redeveloped.  After loading with amnio, he remained in a sinus rhythm with hemodynamically stability. Dictated by:   Gwenith Daily Tyrone Sage, M.D. Attending Physician:  Waldo Laine DD:  11/03/01 TD:  11/04/01 Job: [id number]

## 2010-10-18 NOTE — Consult Note (Signed)
Robert [name]. [facility]  Patient:    Robert, [name] Visit Number: [id number] MRN: [id number]          Service Type: SUR Location: 2000 2001 01 Attending Physician:  Waldo Laine Dictated by:   Garnetta Buddy, M.D. Proc. Date: 11/05/01 Admit Date:  11/02/2001   CC:         Ramon Dredge B. Tyrone Sage, M.D.   Consultation Report  REQUESTING PHYSICIAN:  Gwenith Daily. Tyrone Sage, M.D.  HISTORY OF PRESENT ILLNESS:  The patient is a 70 year old white male with acute renal failure, nonoliguric, status post four-vessel CABG November 02, 2001 complicated by an episode of atrial fibrillation, rapid ventricular rate requiring DC cardioversion and episode of hypotension with systolic blood pressure less than 80.  His creatinine is 1.2 at baseline, he has greater than 300 mg/dL of proteinuria, it increased to 2.2 the day postcatheterization and is 3.2 today.  PAST MEDICAL HISTORY: 1. Benign prostatic hypertrophy. 2. Obstructive sleep apnea. 3. Diabetes mellitus type 2. 4. Hypertension. 5. Status post cervical disk fusion C6-7, 4 and 5. 6. Peripheral vascular disease with intermittent claudication.  MEDICATIONS:  Trental 400 mg t.i.d., Cardura 2 mg daily, Altace 5 mg daily, Plavix 75 mg daily, aspirin one tablet daily, Norvasc 5 mg daily, Avandamet 5/500 mg b.i.d., Lipitor 20 mg daily.  ALLERGIES:  No known drug allergies.  SOCIAL HISTORY:  He is married, he has 11 children, 22 grandchildren.  He is a Nutritional therapist, has not used tobacco since his surgery but he is a two pack cigarettes per day for 20 years.  FAMILY HISTORY:  His mother died in her 8s from brain cancer.  His father was 44s when he died of a myocardial infarction.  He has two sisters; one sister has diabetes.  REVIEW OF SYSTEMS:  EYES: He denies any visual complaints or retinopathy. EAR/NOSE/THROAT: Denies any hearing loss, has obstructive sleep apnea. CARDIOVASCULAR: He denies anginal chest pains,  orthopnea, PND, palpitations, blackout spells, or ankle or leg swelling.  RESPIRATORY SYSTEM: Denies cough, wheeze, hemoptysis; does have obstructive sleep apnea and is a smoker two packs cigarettes per day for a long period of time.  ABDOMINAL SYSTEM: No abdominal pain, nausea, or vomiting, no abdominal surgery, no change in bowel habits, no blood noted in stools.  UROGENITAL: Admits to frequency, urgency, hesitancy, and prostatism.  ENDOCRINE: Has dyslipidemia, diabetes mellitus type 2, and has normal thyroid studies; he is recently diagnosed type 2 diabetes.  NEUROLOGIC: No seizures, strokes, TIAs, or amaurosis fugax. RHEUMATOLOGIC: Denies any joint pains or swelling, no history of gout or osteoarthritis; does not use nonsteroidal anti-inflammatories. HEMATOLOGIC-ONCOLOGIC: No history of anemia or carcinoma.  DERMATOLOGIC: Denies any history of itching or any skin rash.  PHYSICAL EXAMINATION:  GENERAL APPEARANCE:  Obese pleasant male in no obvious distress wearing a face mask; he is comfortable.  VITAL SIGNS:  Blood pressure 160/90, pulse of 90, saturations in the 100% and temperature of 97.  HEAD AND EYES:  Normocephalic, atraumatic.  He has mild pallor; no icterus.  EAR/NOSE/THROAT:  Clear.  NECK:  Supple with bilateral carotid bruits; JVD is mildly elevated 2-5 cm above the angle of Louis.  CARDIOVASCULAR:  Faint systolic murmur heard at left sternal edge, does not radiate; no rubs, no gallops audible.  RESPIRATORY:  Lungs fields are clear to auscultation anteriorly, diminished air entry at the bases, percussion noted dull at bases.  He has a thick chest with increased AP diameter.  ABDOMEN:  Obese, soft, nontender, bowel  sounds are diminished; no bruits are heard.  NEUROLOGIC:  Cranial nerves II-XII are intact.  Babinskis are downgoing.  EXTREMITIES:  1+ edema.  He has bilateral femoral bruits.  RHEUMATOLOGIC:  No joint swellings, some mild digital changes of  DJD.  DERMATOLOGIC:  No skin rash.  LABORATORIES:  Sodium 136, potassium 4.0, chloride 108, CO2 22, BUN 55, creatinine 3.2, glucose 105, albumin 2.3, calcium 8.3.  Liver functions normal.  Hemoglobin 8.2, WBC 11.2, platelets 144.  Urinalysis greater than 300 mg of protein; no wbcs.  ASSESSMENT AND PLAN: 1. Acute renal failure.  Etiology most likely secondary to acute tubular    necrosis from hypotension and atrial fibrillation.  I would avoid    nephrotoxins, no ACE inhibitors, definitely no metformin and will continue    supportive therapy, will follow daily. 2. Volume overload.  I would suggest Lasix 80 mg IV q.24h. as needed to    maintain urine output and negative balance. 3. Atrial fibrillation.  Amiodarone loaded.  Normal sinus rhythm. 4. Chronic obstructive pulmonary disease  Continue pulmonary toilet,    nebulizers. 5. Anemia.  Continue Epogen 40,000 units every week; replace iron with blood    transfusion. 6. 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 Dictated by:   Garnetta Buddy, M.D. Attending Physician:  Waldo Laine DD:  11/05/01 TD:  11/08/01 Job: [id number]

## 2010-10-18 NOTE — Discharge Summary (Signed)
[place]. [facility]  Patient:    Robert [name], Robert [name] Visit Number: [id number] MRN: [id number]          Service Type: SUR Location: 2000 2001 01 Attending Physician:  Waldo Laine Dictated by:   Adair Patter, P.A. Admit Date:  11/02/2001 Discharge Date: 11/11/2001   CC:         Asencion Gowda, M.D.  [facility]. [facility]   Discharge Summary  DATE OF BIRTH: 19-Jun-1940  ADMISSION DIAGNOSIS:  Coronary artery disease.  SECONDARY DIAGNOSES 1. Acute renal failure. 2. Non-insulin dependent diabetes mellitus. 3. Hypertension. 4. Postoperative atrial fibrillation.  DISCHARGE DIAGNOSIS:  Coronary artery disease.  HOSPITAL COURSE:  The patient was admitted to [facility] on November 02, 2001, secondary to a known history of coronary artery disease. He had a cardiac catheterization done which was significant left main coronary artery disease. He was seen and evaluated by Dr. Tyrone Sage as an outpatient. On the day of admission the patient underwent a coronary artery bypass graft x 4, left internal mammary artery anastomosis to the left anterior descending coronary artery, saphenous vein graft sequentially anastomosed to the intermediate and circumflex artery and a saphenous vein graft to the right coronary artery. No complications were noted during the procedure.  Postoperatively the patient had a hospital course complicated by rapid atrial fibrillation. Because of this the patient was temporarily anticoagulated with Coumadin. He was also rate controlled with beta blocker and started on amiodarone. The patient reverted back to normal sinus rhythm and his anticoagulation was discontinued.  His postoperative course was also complicated by acute renal failure. Because of this a nephrology consultation was obtained. All nephrotoxic drugs were discontinued. His renal function began to stabilize and he was subsequently deemed  stable for discharge to home on November 10, 2001.  DISCHARGE MEDICATIONS 1. Tylox 1 to 2 tablets q.4-6h. p.r.n. pain. 2. Aspirin 81 mg 1 q.d. 3. Protonix 40 mg 1 q.d. 4. Amiodarone 200 mg 1 p.o. b.i.d. 5. Lipitor 20 mg 1 p.o. b.i.d. 6. Toprol XL 25 mg 1 p.o. q.d. 7. Avandia 2 mg 1 tablet p.o. b.i.d.  DISCHARGE INSTRUCTIONS: Activity, the patient was told no driving or strenuous activity or lifting heavy objects. He was told to walk daily and to continue his breathing exercises. Discharge diet 1800 calorie a day ADA diet. Wound care, the patient was told he could shower and clean his incision with soap and water.  DISPOSITION: Discharged to home.  FOLLOWUP: The patient was told to call his cardiologist, Dr. Welton Flakes, for a two week followup appointment. He was told to call his nephrologist for a two to four week followup appointment. He was also told to see Dr. Tyrone Sage on Thursday, December 02, 2001, at 9:30 a.m. He was told to go to the [facility] one hour before his appointment for a chest x-ray. In addition to that he said he will have outpatient diabetic education classes in [place], [place], and he will be notified by the diabetic coordinator at home.  Dictated by:   Adair Patter, P.A. Attending Physician:  Waldo Laine DD:  11/09/01 TD:  11/11/01 Job: [id number]

## 2010-10-18 NOTE — Discharge Summary (Signed)
NAME:  Robert [name], Robert [name]          ACCOUNT NO.:  [id number]   MEDICAL RECORD NO.:  [id number]          PATIENT TYPE:  INP   LOCATION:  4742                         FACILITY:  [facility]   PHYSICIAN:  Maple Mirza, P.A. DATE OF BIRTH:  1941/01/08   DATE OF ADMISSION:  06/25/2004  DATE OF DISCHARGE:                                 DISCHARGE SUMMARY   ADDENDUM   This addendum discusses the patient's candidacy for the device.  The patient  initially considered for pacemaker/ICD based on several criteria, one of  which is ejection fraction 30-35%.  However, followup of 2D echocardiogram  and MUGA scan show ejection fraction greater than 40%.  Therefore, the  decision to place a Medtronic EnRhythm pacemaker was taken instead of  placing a pacemaker ICD.      GM/MEDQ  D:  06/27/2004  T:  06/27/2004  Job:  [id number]   cc:   Duke Salvia, M.D.   Nelida Gores, MD  [place], [place]   Dr. [facility]  [place], [place]

## 2010-10-18 NOTE — Op Note (Signed)
NAME:  Robert [name], Robert [name] NO.:  [id number]   MEDICAL RECORD NO.:  [id number]          PATIENT TYPE:  INP   LOCATION:  4742                         FACILITY:  [facility]   PHYSICIAN:  Duke Salvia, M.D.  DATE OF BIRTH:  1940-08-10   DATE OF PROCEDURE:  06/25/2004  DATE OF DISCHARGE:                                 OPERATIVE REPORT   PREOPERATIVE DIAGNOSIS:  Atrial flutter.   POSTOPERATIVE DIAGNOSIS:  Sinus rhythm.   OPERATION/PROCEDURE:  Invasive electrophysiological study, arrhythmia  mapping, and radiofrequency catheter ablation.   DESCRIPTION OF PROCEDURE:  After obtaining informed consent, the patient was  submitted to general anesthesia under the care of Dr. Diamantina Monks.  After  routine prep and drape, catheterization was performed.  At the end of the  procedure the catheters were removed, hemostasis was obtained and the  patient prepared for pacemaker implantation in stable condition.   CATHETERS:  A 5-French quadripolar catheter was inserted via the left  femoral vein into the AV junction to measure the His electrogram.   A 7-French duo decapolar catheter was inserted via the right femoral vein  and subsequently moved to the left femoral vein to mapping sites of the  tricuspid annulus and subsequently coronary sinus.   A 7-French 8 mm deflectable tip thermistor catheter was inserted via the  right femoral vein via the SL2 sheath to mapping sites in the posterior  septal space.   Surface leads 1, aVF and V1 were monitored continuously throughout the  procedure.  Following insertion of the catheters, stimulation protocol  included incremental atrial pacing, incremental ventricular pacing, single  and double ventricular extrastimuli at the paced cycle length of 400:500  milliseconds and double and triple ventricular extrastimuli at paced cycle  lengths of 550 and 400 milliseconds from the right ventricular apex.   RESULTS:                            Initial                  Final  1.  Surface electrocardiogram:  Rhythm                        Sinus             Sinus  Cycle length                        1099 msec         1119 msec  PR interval                   203 msec          165 msec  QRS duration                  167 msec          184 msec  QT interval                   524 msec  474 msec  P wave duration               150 msec          137 msec  Bundle branch block           Right             Right  Preexcitation                 Absent            Absent   AH interval                   88 msec           52 msec  AH interval                   90 msec           55 msec   1.  AV nodal function:  AV Wenckebach cycle length was 450 milliseconds.  VA conduction was blocked at 600 milliseconds.   1.  AV nodal conduction was continuous.   1.  Accessory pathway function:  No evidence of an accessory pathway was      identified.   1.  Ventricular response to programmed stimulation:  The effective      refractory period at the right ventricular apex at a paced cycle length      of 550 milliseconds was 320 milliseconds and at 400 milliseconds was 300      milliseconds.   The closest coupling interval from the right ventricular apex at 550  milliseconds was 310:290:240 and at 400 milliseconds was 290:260:240.   1.  Arrhythmias induced:  Atrial flutter with a typical counter clockwise      morphology was induced with coronary sinus pacing.  The atrial cycle      length was quite prolonged at 360 milliseconds.  Electrogram mapping      confirmed counter clockwise rotation.   Radiofrequency energy delivered between the tricuspid valve and the inferior  vena cava at about 5 o'clock on the cava tricuspid isthmus terminated atrial  flutter and subsequently resulted in bidirectional isthmus block.   FLUOROSCOPY TIME:  A total of 20 minutes of fluoroscopy time was utilized at  7-1/2 frames per second.   RADIOFREQUENCY ENERGY:  13 minutes  and 7 seconds of RF energy were applied  between the tricuspid annulus and the inferior vena cava to accomplish  arrhythmia termination and bidirectional isthmus block.   The patient tolerated the procedure without apparent complications.  The  patient's INR was noted to be 2.5 and the patient was then prepared for  pacemaker implantation.      SCK/MEDQ  D:  06/27/2004  T:  06/27/2004  Job:  [id number]   cc:   Electrophysiology Laboratory   [facility]   Lorie Apley, M.D. [facility]

## 2010-10-18 NOTE — Discharge Summary (Signed)
NAME:  Robert [name], Robert [name]          ACCOUNT NO.:  [id number]   MEDICAL RECORD NO.:  [id number]          PATIENT TYPE:  INP   LOCATION:  4742                         FACILITY:  [facility]   PHYSICIAN:  Duke Salvia, M.D.  DATE OF BIRTH:  1940/08/09   DATE OF ADMISSION:  06/25/2004  DATE OF DISCHARGE:  06/27/2004                                 DISCHARGE SUMMARY   DISCHARGE DIAGNOSES:  1.  Discharging post procedure day #2 status post CTI ablation of typical      atrial flutter/implantation of Medtronic DDD pacemaker under general      anesthesia.  2.  Discharging day #1 status post right atrial lead revision for atrial      lead failure to capture with migration to the right ventricle.  3.  Post lead revision atrial fibrillation rapid ventricular rate, DC      cardioversion, planned for the morning of June 27, 2004, however, the      patient converted to sinus rhythm with atrial pacing at 1:15 a.m.,      January 26, and has remained in sinus rhythm.  4.  Chronotropic incompetence and bradycardia with exercise intolerance and      fatigue.   SECONDARY DIAGNOSES:  1.  History of severe three vessel coronary artery disease, status post      coronary artery bypass graft surgery.  2.  Syncope.  3.  Ischemic cardiomyopathy, ejection fraction 30-35%.  4.  History of typical atrial flutter.  5.  Right bundle branch block.  6.  Resection of bladder tumor.  7.  Status post iodine 131 thyroid ablation for Graves' disease.  8.  Gastroesophageal reflux disease.  9.  Arthritis.  10. Anxiety.  11. Status post cervical surgery, tonsillectomy.   PROCEDURES:  1.  June 25, 2004, successful CTI ablation with implantation of DDD      Medtronic and EnRhythm pacemaker.  2.  June 26, 2004, revision of right atrial lead after discovery of lead      migration into the right ventricle.   DISCHARGE DISPOSITION:  Robert [name] ready for discharge January 26.  As  mentioned above, after lead  revision on January 25 the patient lapsed into  atrial fibrillation with a rapid ventricular rate.  He was scheduled for DC  cardioversion the morning of January 26, but converted in the early morning  hours to a sinus rhythm.  He currently discharges with A-pacing, 94% oxygen  saturation on room air.  His PT on the day of discharge was 24.4, INR was  3.0.  His admission PT/INR on January 24 was 22.2 and 2.5, respectively.  On  January 25, the PT was 22.6 and INR was 2.6.  He has been on his home  regimen of Coumadin which is 10 mg every Monday, Wednesday, Friday and 7.5  mg Tuesday, Thursday, Saturday, and Sunday.  With this medication his INR  has been slowly creeping up, therefore, Coumadin has been slightly amended.  He will be placed on 5 mg today, January 26, Thursday and then resume 7.5 mg  daily.  He will report to Dr. Onnie Boer office for  a scheduled appointment  Tuesday, July 02, 2004 at 8:30 a.m. and a redraw of the PT/INR with a  target of 2 to 3 INR expected at that time.   DISCHARGE MEDICATIONS:  1.  Toprol 25 mg daily.  2.  Flomax 0.4 mg daily.  3.  Ferrous gluconate 325 mg daily.  4.  Furosemide 40 mg, two tablets daily.  5.  Synthroid 112 mcg daily.  6.  Enalapril 5 mg daily.  7.  Trental 400 mg t.i.d.  8.  Coumadin 5 mg today, January 26, then resume 7.5 mg daily.  9.  Multivitamin daily.  10. Aspirin 81 mg daily.  11. Ambien CR 12.5 p.r.n.  12. For pain management he is to take Tylenol 325 mg one to two tablets q.4-      6h. p.r.n. pain.   The patient has been given a mobility sheet with instructions for upper  extremity motion.  He was asked to keep his incision dry for the next 7  days, he is to sponge bathe until Tuesday, January 31.   FOLLOW UP:  1.  [facility] at [address], [place] [facility]      on Thursday, July 11, 2004 at 9 a.m.  2.  He is to see Dr. Graciela Husbands on Friday, September 27, 2004 at 9:30 a.m.  3.  Once again, he is  to keep an appointment with Dr. Neale Burly scheduled for      Tuesday, July 02, 2004 at 8:30 a.m.   BRIEF HISTORY:  Robert [name] is a 70 year old male, he has undergone  bypass surgery for severe three vessel coronary artery disease in the past.  He had a recent echocardiogram and Cardiolite study in the summer of 2005,  ejection fraction was 30-35% with some inferoapical fixed wall motion  abnormalities.  Mr. Shin has a history of atrial arrhythmias, they  were noted first at the coronary artery bypass graft surgery in 2003, at  that time he had recurrent atrial flutter.  He was cardioverted in January  2005 but over the summer was found to have relapsed into atrial flutter.  By  the fall of 2005 this was intermittent interspersing with sinus rhythm.  The  atrial flutter by electrocardiogram seems to be a typical one.  When the  patient is in sinus rhythm he experiencing significant bradycardia with  heart rates in the 40s.  He has chronotropic incompetence with inability to  meet metabolic demands by increasing heart rate and, therefore, has fatigue  and weakness.  This past summer he was diagnosed with Graves' disease and  underwent radioactive iodine 131 ablation with subsequent thyroid  replacement.  The patient also had a history of syncope 1 year ago, it was  abrupt in onset, he had severe lightheadedness, requiring him to sit down.  The episode lasted 30 to 60 seconds and resolved.  There were no associated  palpitations and no residual fatigue or chest pain.  The patient also has  high blood pressure, borderline diabetes and a history of congestive heart  failure.  The patient also has a history of renal insufficiency with a  creatinine running about 2.0.  It was Dr. Odessa Fleming impression after examining  and studying Mr. Elpers's history that with chronotropic incompetence and left ventricular dysfunction he would benefit from a dual-chamber ICD  implantation.  It was  also felt that further assessment might present  opportunity for cardiac resynchronization therapy.  Procedures to be  considered  would be implantation of pacemaker and ablation of typical atrial  flutter, these would be done under general anesthesia secondary to a history  of sleep apnea.  The risks and benefits were described to the patient and  the patient wishes to proceed.   HOSPITAL COURSE:  The patient was admitted on January 24 for elective  implantation of the device.  This was done with mild complication secondary  to recurrent loss of R wave amplitude and a new atrial lead was inserted.  In the ensuing post procedure period, the patient was found to have  insufficient pacemaker capture and was taken for atrial lead revision which  was subsequently found to have migrated to the right ventricle.  This  procedure was performed by Dr. Lewayne Bunting without complication on January  25.  The patient, however, did lapse into atrial fibrillation with a rapid  ventricular rate after the lead revision and DC cardioversion was planned  for the morning of January 26.  This procedure was aborted since the patient  self-converted to sinus rhythm and was A-pacing at the  time of discharge.  The patient discharged on the medications and followup.  His incision is healing nicely without evidence of erythema or swelling.  Once again, PT/INR was slowly drifting upward and Coumadin dosing has been  slightly readjusted.      GM/MEDQ  D:  06/27/2004  T:  06/27/2004  Job:  [id number]   cc:   Marcina Millard, M.D.  [facility], [place]   Dr. [facility]  [place], [place]

## 2010-10-18 NOTE — Op Note (Signed)
NAME:  Robert [name], Robert [name]          ACCOUNT NO.:  [id number]   MEDICAL RECORD NO.:  [id number]          PATIENT TYPE:  INP   LOCATION:  4742                         FACILITY:  [facility]   PHYSICIAN:  Doylene Canning. Ladona Ridgel, M.D.  DATE OF BIRTH:  June 14, 1940   DATE OF PROCEDURE:  06/26/2004  DATE OF DISCHARGE:                                 OPERATIVE REPORT   PROCEDURE PERFORMED:  Atrial lead revision.   INDICATION:  Atrial lead dislodgement in a patient status post atrial  flutter ablation with symptomatic tachybrady syndrome.   INTRODUCTION:  The patient is a 70 year old man with a history of atrial  flutter and a rapid ventricular response as well as sinus bradycardia.  He  underwent electrophysiologic study and catheter ablation of atrial flutter,  followed by a permanent pacemaker insertion secondary to severe sinus  bradycardia.  Postoperatively, the patient experienced an atrial lead  dislodgement and spontaneously reverted to atrial fibrillation.  He is now  referred for atrial lead revision.  Of note, the patient's R waves also had  decreased at the time of initial postoperative pacemaker interrogation.   DESCRIPTION OF PROCEDURE:  After informed consent was obtained, the patient  was taken to the diagnostic CT lab in a fasting state.  After the usual  preparation and draping, intravenous midazolam and fentanyl was given for  sedation.  30 cc of lidocaine was infiltrated into the left infraclavicular  region.  A 5-cm incision was carried out over the old pacemaker insertion  site and electrocautery was utilized to dissect down to the fascial plane.  The pacemaker generator was freed from its silk suture and removed with  gentle traction.  The leads were removed from the generator.  The leads were  interrogated.  The R waves through the interrogator measured 4 to 7 mV.  The  pacing threshold was acceptable at 0.7 volts at 0.5 mS.  The pacing  impedances in the ventricle were stable.   Attention was then turned to repositioning the atrial lead which was in the  right ventricle.  The screw was withdrawn back into housing mechanism of the  lead itself and the J wire was advanced and mapping was carried out in the  right atrium during atrial fibrillation.  Initially, a site on the septal  surface of the right atrium was chosen and after that removal of the stylet  resulted in dislodgement of the lead.  The lead was then remapped after  retracting the helix back into the housing mechanism along the anterolateral  wall of the right atrium.  The final site fibrillation waves measured  between 1 and 4 mV, typically between 2 and 3 mV.  AOO pacing with the lead  actually fixed was stable with pacing impedances in the 500 to 600 range.  At this point, 10 volt pacing did not stimulate the diaphragm.  With the  atrial lead in satisfactory position, it was secured to the subpectoral  fascia with a figure-of-eight silk suture.  In addition, the RV lead was  secured in the same way and the sewing sutures secured with a silk suture as  well.  Kanamycin irrigation was utilized to irrigate the pocket and the  Medtronic dual chamber pacemaker (in rhythm) was then reconnected to the  atrial and ventricular pacing leads.  Additional kanamycin irrigation was  utilized to irrigate the pocket and the incision was closed with a layer of  2-0 Vicryl, followed by a layer of 3-0 Vicryl, followed by a layer of 4-0  Vicryl.  Benzoin was painted on the skin, Steri-Strips were applied, a  pressure dressing was placed, and the patient was returned to his room in  satisfactory condition.   COMPLICATIONS:  There were no immediate complications.   RESULTS:  Successful atrial lead revision in a patient with a previously  implanted dual chamber pacemaker.      GWT/MEDQ  D:  06/26/2004  T:  06/26/2004  Job:  [id number]   cc:   Duke Salvia, M.D.   Dr. Lynn Ito, [place]

## 2010-10-24 ENCOUNTER — Ambulatory Visit (INDEPENDENT_AMBULATORY_CARE_PROVIDER_SITE_OTHER): Payer: Medicare Other | Admitting: *Deleted

## 2010-10-24 DIAGNOSIS — I495 Sick sinus syndrome: Secondary | ICD-10-CM

## 2010-10-25 ENCOUNTER — Other Ambulatory Visit: Payer: Self-pay

## 2010-10-29 NOTE — Progress Notes (Signed)
Pacer remote 

## 2010-11-04 ENCOUNTER — Ambulatory Visit: Payer: Medicare Other | Admitting: Urology

## 2010-11-07 ENCOUNTER — Encounter: Payer: Self-pay | Admitting: *Deleted

## 2010-11-14 ENCOUNTER — Ambulatory Visit: Payer: Medicare Other | Admitting: Vascular Surgery

## 2010-11-18 ENCOUNTER — Encounter: Payer: Self-pay | Admitting: Cardiovascular Disease

## 2010-12-27 ENCOUNTER — Ambulatory Visit: Payer: Medicare Other | Admitting: Otolaryngology

## 2011-01-23 ENCOUNTER — Ambulatory Visit (INDEPENDENT_AMBULATORY_CARE_PROVIDER_SITE_OTHER): Payer: Medicare Other | Admitting: *Deleted

## 2011-01-23 ENCOUNTER — Other Ambulatory Visit: Payer: Self-pay | Admitting: Internal Medicine

## 2011-01-23 DIAGNOSIS — I495 Sick sinus syndrome: Secondary | ICD-10-CM

## 2011-01-24 LAB — REMOTE PACEMAKER DEVICE
AL IMPEDENCE PM: 392 Ohm
ATRIAL PACING PM: 3.86
BATTERY VOLTAGE: 2.88 V
RV LEAD IMPEDENCE PM: 592 Ohm
VENTRICULAR PACING PM: 26.63

## 2011-01-29 ENCOUNTER — Encounter: Payer: Self-pay | Admitting: *Deleted

## 2011-02-05 ENCOUNTER — Ambulatory Visit: Payer: Medicare Other | Admitting: Urology

## 2011-02-11 NOTE — Progress Notes (Signed)
Pacer  Checked by remote.

## 2011-03-04 ENCOUNTER — Telehealth: Payer: Self-pay

## 2011-03-04 NOTE — Telephone Encounter (Signed)
Will forward to Dr. Klein for review. 

## 2011-03-04 NOTE — Telephone Encounter (Signed)
Patient's wife calling with blood pressure being decreased off & on for one month.  Dr. Leavy Cella has been changing medications around but not helping.  Blood pressure today before dialysis was given read 111/73 at dialysis heart rate reading unknown. Please advise what to do?

## 2011-03-04 NOTE — Telephone Encounter (Signed)
Please have Dr. Graciela Husbands review and notify Bishop Dublin, thanks.

## 2011-03-04 NOTE — Telephone Encounter (Signed)
Error

## 2011-03-06 NOTE — Telephone Encounter (Signed)
Think about talilking to renal MD  Probably best person to give advice thnaks

## 2011-03-10 ENCOUNTER — Telehealth: Payer: Self-pay

## 2011-03-10 NOTE — Telephone Encounter (Signed)
Spoke with patient's wife regarding blood pressure. She was told by Dr. Ellis Savage (renal doctor) to contact Dr. Graciela Husbands regarding the decreased blood pressure.  He was taken off carvedilol and amlodipine.  Please advise what to do?  You do not have any openings to see the patient this Thursday in [place].

## 2011-03-11 ENCOUNTER — Encounter: Payer: Self-pay | Admitting: Internal Medicine

## 2011-03-11 NOTE — Telephone Encounter (Signed)
We should try and get him back on his coreg. We can communicate this to his renal MD plz as they are the ones making the decisions thnaks steve

## 2011-03-11 NOTE — Telephone Encounter (Signed)
LMOM to have patient call the office.

## 2011-03-11 NOTE — Telephone Encounter (Signed)
Notified patient's wife need to start back on the carvedilol 12.5 mg bid per Dr. Graciela Husbands. The patient will monitor blood pressure & heart rate and bring the readings to the office next week.  The patient also has a one year follow up on Oct. 25, 2012 with Dr. Graciela Husbands.

## 2011-03-19 ENCOUNTER — Telehealth: Payer: Self-pay | Admitting: *Deleted

## 2011-03-19 NOTE — Telephone Encounter (Signed)
Pt dropped off BP numbers and asking if Dr. Graciela Husbands ok with him starting Florinef 0.1mg  daily per Dr. Leavy Cella pcp. BP ranging 88-120/25-68 (normally systolic 90s-105), HR ranging low 40s-70s. Please advise.

## 2011-03-24 NOTE — Telephone Encounter (Signed)
i have no problems with him starting florinef with his BP recordings as noted  [place]

## 2011-03-26 NOTE — Telephone Encounter (Signed)
Pt notified, he will discuss with Dr. Graciela Husbands at f/u tomorrow.

## 2011-03-27 ENCOUNTER — Ambulatory Visit (INDEPENDENT_AMBULATORY_CARE_PROVIDER_SITE_OTHER): Payer: Medicare Other | Admitting: Internal Medicine

## 2011-03-27 ENCOUNTER — Encounter: Payer: Self-pay | Admitting: Internal Medicine

## 2011-03-27 DIAGNOSIS — I4891 Unspecified atrial fibrillation: Secondary | ICD-10-CM

## 2011-03-27 DIAGNOSIS — I951 Orthostatic hypotension: Secondary | ICD-10-CM | POA: Insufficient documentation

## 2011-03-27 DIAGNOSIS — I498 Other specified cardiac arrhythmias: Secondary | ICD-10-CM

## 2011-03-27 LAB — PACEMAKER DEVICE OBSERVATION
AL AMPLITUDE: 1.4404 mv
AL IMPEDENCE PM: 392 Ohm
BATTERY VOLTAGE: 2.86 V
BRDY-0002RV: 60 {beats}/min
RV LEAD IMPEDENCE PM: 608 Ohm
VENTRICULAR PACING PM: 24

## 2011-03-27 MED ORDER — CARVEDILOL 6.25 MG PO TABS: ORAL_TABLET | ORAL | Status: DC

## 2011-03-27 NOTE — Assessment & Plan Note (Signed)
He is having significant problems with orthostatic hypotension and presyncope. There've been some adjustments made in his estimated dry weight. It isn't recommended that he take Florinef. I would not be inclined that way but would rather consider ProAmatine.  In addition, I have taken the liberty of decreasing his carvedilol to 3.125 on dialysis days and 6.25 twice daily on nondialysis days. Further I have asked for him to review with his nephrologist as to whether he needs to be on furosemide and with his primary care physician as to whether his amitriptyline can be changed as it can also aggravate orthostatic intolerance.

## 2011-03-27 NOTE — Assessment & Plan Note (Signed)
The patient's device was interrogated.  The information was reviewed. No changes were made in the programming.    

## 2011-03-27 NOTE — Assessment & Plan Note (Signed)
24% ventricular paced

## 2011-03-27 NOTE — Progress Notes (Signed)
HPI  Robert [name] is a 70 y.o. male  was seen by Dr. Ladona Ridgel 2 months agor or PPM followup.  He has been doing relatively well. His limitations are late largely related to his spinal stenosis. He denies chest pain or shortness of breath. He Previously tolerated dialysis well but this has not been true over the last couple of months. He has had progressive problems with fatigue weakness and lightheadedness. This is particularly true on dialysis days. His best day is his Monday. He has been noted to have orthostatic intolerance following dialysis. There've been some adjustments made to his dry weight estimate. Not withstanding he continues to feel lousy.  Previously he was on Coumadin. He has not tolerated that because of bleeding issues. He currently takes aspirin for stroke prophylaxis.   Past Medical History  Diagnosis Date  . CAD (coronary artery disease)     EF 40% 2011 CABG 2004  . PVD (peripheral vascular disease)   . Carotid artery disease   . Sinus node dysfunction     noted following Afluttter RFCA  . Atrial fibrillation     S/P AFlutter RFCA 2006  . Pacemaker     2006    Past Surgical History  Procedure Date  . Coronary artery bypass graft   . Tonsillectomy   . Bladder surgery   . Pacemaker insertion 1/24/6    Medtronic N-rhythm [id number]    Current Outpatient Prescriptions  Medication Sig Dispense Refill  . albuterol (PROVENTIL HFA) 108 (90 BASE) MCG/ACT inhaler Inhale 2 puffs into the lungs every 6 (six) hours as needed. UAD       . amitriptyline (ELAVIL) 50 MG tablet Take 50 mg by mouth at bedtime.        Marland Kitchen aspirin 81 MG EC tablet Take 162 mg by mouth daily.        Marland Kitchen b complex vitamins tablet Take 1 tablet by mouth daily.        . carbidopa-levodopa (PARCOPA) 25-100 MG per disintegrating tablet Take 1 tablet by mouth 3 (three) times daily.        . carvedilol (COREG) 12.5 MG tablet Take 12.5 mg by mouth 2 (two) times daily with a meal.        . Cholecalciferol  1000 UNITS capsule Take 1,000 Units by mouth daily.        . Coenzyme Q10 (COQ10) 100 MG CAPS Take 1 capsule by mouth 3 (three) times daily.        Marland Kitchen dutasteride (AVODART) 0.5 MG capsule Take 0.5 mg by mouth once a week.        Marland Kitchen epoetin alfa (PROCRIT) 3000 UNIT/ML injection Inject 6,000 Units into the skin 2 (two) times a week.        . furosemide (LASIX) 40 MG tablet Take 40 mg by mouth daily.        Marland Kitchen levothyroxine (SYNTHROID, LEVOTHROID) 150 MCG tablet Take 150 mcg by mouth daily.        . Melatonin 5 MG TABS Take 5 mg by mouth daily.        Marland Kitchen omeprazole (PRILOSEC) 40 MG capsule Take 40 mg by mouth daily.        . Probiotic Product (PROBIOTIC FORMULA PO) Take by mouth.          Allergies  Allergen Reactions  . Ciprofloxacin   . Morphine   . Niacin     Review of Systems negative except from HPI and PMH  Physical Exam Well  developed and well nourished in no acute distress HENT normal E scleral and icterus clear Neck Supple JVP flat; carotids brisk and full Clear to ausculation Regular rate and rhythm, 2/6 systolic murmur Soft with active bowel sounds No clubbing cyanosis and edema Alert and oriented, grossly normal motor and sensory function Skin Warm and Dry    Assessment and  Plan

## 2011-03-27 NOTE — Assessment & Plan Note (Signed)
permanent

## 2011-03-27 NOTE — Patient Instructions (Signed)
Your physician has recommended you make the following change in your medication: DECREASE Carvedilol to 6.25mg  TWICE daily and ONLY TAKE 1/2 tablet on DIALYSIS days. (Until you get new prescription, take 1/4 tablet of 12.5mg  tablet on DIALYSIS days and 1/2 tablet on non-Dialysis days).  Your physician recommends that you schedule a follow-up appointment in: 1 year

## 2011-04-02 ENCOUNTER — Ambulatory Visit: Payer: Medicare Other | Admitting: Vascular Surgery

## 2011-06-04 ENCOUNTER — Ambulatory Visit: Payer: Self-pay | Admitting: Urology

## 2011-06-26 ENCOUNTER — Encounter: Payer: Medicare Other | Admitting: *Deleted

## 2011-06-30 ENCOUNTER — Encounter: Payer: Self-pay | Admitting: *Deleted

## 2011-07-03 ENCOUNTER — Ambulatory Visit (INDEPENDENT_AMBULATORY_CARE_PROVIDER_SITE_OTHER): Payer: Medicare Other | Admitting: *Deleted

## 2011-07-03 ENCOUNTER — Encounter: Payer: Self-pay | Admitting: Internal Medicine

## 2011-07-03 DIAGNOSIS — Z950 Presence of cardiac pacemaker: Secondary | ICD-10-CM

## 2011-07-03 DIAGNOSIS — I4891 Unspecified atrial fibrillation: Secondary | ICD-10-CM

## 2011-07-07 LAB — REMOTE PACEMAKER DEVICE
AL IMPEDENCE PM: 376 Ohm
ATRIAL PACING PM: 2.88
BATTERY VOLTAGE: 2.85 V
RV LEAD IMPEDENCE PM: 624 Ohm
VENTRICULAR PACING PM: 21.84

## 2011-07-09 NOTE — Progress Notes (Signed)
Remote pacer check  

## 2011-07-22 ENCOUNTER — Encounter: Payer: Self-pay | Admitting: *Deleted

## 2011-08-07 ENCOUNTER — Ambulatory Visit: Payer: Self-pay | Admitting: Urology

## 2011-08-15 ENCOUNTER — Encounter: Payer: Self-pay | Admitting: Internal Medicine

## 2011-08-26 ENCOUNTER — Ambulatory Visit: Payer: Self-pay | Admitting: Vascular Surgery

## 2011-08-26 LAB — BASIC METABOLIC PANEL
BUN: 40 mg/dL — ABNORMAL HIGH (ref 7–18)
Chloride: 104 mmol/L (ref 98–107)
Co2: 25 mmol/L (ref 21–32)
EGFR (Non-African Amer.): 9 — ABNORMAL LOW
Glucose: 129 mg/dL — ABNORMAL HIGH (ref 65–99)
Osmolality: 291 (ref 275–301)
Potassium: 4.9 mmol/L (ref 3.5–5.1)
Sodium: 140 mmol/L (ref 136–145)

## 2011-09-16 ENCOUNTER — Encounter: Payer: Self-pay | Admitting: Orthopedic Surgery

## 2011-09-30 ENCOUNTER — Ambulatory Visit: Payer: Self-pay | Admitting: Vascular Surgery

## 2011-09-30 LAB — POTASSIUM: Potassium: 4.8 mmol/L (ref 3.5–5.1)

## 2011-10-01 ENCOUNTER — Encounter: Payer: Self-pay | Admitting: Orthopedic Surgery

## 2011-10-02 ENCOUNTER — Encounter: Payer: Self-pay | Admitting: Internal Medicine

## 2011-10-02 ENCOUNTER — Ambulatory Visit (INDEPENDENT_AMBULATORY_CARE_PROVIDER_SITE_OTHER): Payer: Medicare Other | Admitting: *Deleted

## 2011-10-02 DIAGNOSIS — I498 Other specified cardiac arrhythmias: Secondary | ICD-10-CM

## 2011-10-06 LAB — REMOTE PACEMAKER DEVICE
AL IMPEDENCE PM: 376 Ohm
ATRIAL PACING PM: 2.98
BATTERY VOLTAGE: 2.83 V
RV LEAD AMPLITUDE: 2.1 mv
RV LEAD IMPEDENCE PM: 616 Ohm
VENTRICULAR PACING PM: 28.53

## 2011-10-10 NOTE — Progress Notes (Signed)
PPM remote 

## 2011-10-22 ENCOUNTER — Encounter: Payer: Self-pay | Admitting: *Deleted

## 2011-11-01 ENCOUNTER — Encounter: Payer: Self-pay | Admitting: Orthopedic Surgery

## 2011-12-08 ENCOUNTER — Ambulatory Visit: Payer: Self-pay | Admitting: Urology

## 2011-12-26 ENCOUNTER — Inpatient Hospital Stay: Payer: Self-pay | Admitting: Internal Medicine

## 2011-12-26 LAB — PROTIME-INR
INR: 1.1
Prothrombin Time: 14.9 secs — ABNORMAL HIGH (ref 11.5–14.7)

## 2011-12-26 LAB — URINALYSIS, COMPLETE
Bacteria: NONE SEEN
Ph: 6 (ref 4.5–8.0)
Protein: 100
RBC,UR: 9 /HPF (ref 0–5)

## 2011-12-26 LAB — COMPREHENSIVE METABOLIC PANEL
Anion Gap: 11 (ref 7–16)
BUN: 53 mg/dL — ABNORMAL HIGH (ref 7–18)
Calcium, Total: 9.5 mg/dL (ref 8.5–10.1)
Chloride: 99 mmol/L (ref 98–107)
Co2: 21 mmol/L (ref 21–32)
EGFR (African American): 8 — ABNORMAL LOW
EGFR (Non-African Amer.): 7 — ABNORMAL LOW
Potassium: 4.6 mmol/L (ref 3.5–5.1)
SGOT(AST): 23 U/L (ref 15–37)
SGPT (ALT): 25 U/L

## 2011-12-26 LAB — CBC WITH DIFFERENTIAL/PLATELET
Basophil %: 0.4 %
Eosinophil %: 1.6 %
HCT: 32.1 % — ABNORMAL LOW (ref 40.0–52.0)
HGB: 11.3 g/dL — ABNORMAL LOW (ref 13.0–18.0)
Lymphocyte #: 1 10*3/uL (ref 1.0–3.6)
MCH: 29.9 pg (ref 26.0–34.0)
MCV: 86 fL (ref 80–100)
Monocyte #: 0.5 x10 3/mm (ref 0.2–1.0)
Monocyte %: 7.3 %
Platelet: 120 10*3/uL — ABNORMAL LOW (ref 150–440)
RBC: 3.76 10*6/uL — ABNORMAL LOW (ref 4.40–5.90)
WBC: 7.1 10*3/uL (ref 3.8–10.6)

## 2011-12-26 LAB — MAGNESIUM: Magnesium: 2 mg/dL

## 2011-12-26 LAB — LIPID PANEL
HDL Cholesterol: 43 mg/dL (ref 40–60)
Ldl Cholesterol, Calc: 95 mg/dL (ref 0–100)
Triglycerides: 73 mg/dL (ref 0–200)

## 2011-12-26 LAB — LIPASE, BLOOD: Lipase: 1501 U/L — ABNORMAL HIGH (ref 73–393)

## 2011-12-27 LAB — COMPREHENSIVE METABOLIC PANEL
Albumin: 3.6 g/dL (ref 3.4–5.0)
Anion Gap: 11 (ref 7–16)
BUN: 31 mg/dL — ABNORMAL HIGH (ref 7–18)
Chloride: 95 mmol/L — ABNORMAL LOW (ref 98–107)
Creatinine: 4.93 mg/dL — ABNORMAL HIGH (ref 0.60–1.30)
EGFR (African American): 13 — ABNORMAL LOW
Glucose: 101 mg/dL — ABNORMAL HIGH (ref 65–99)
Potassium: 4 mmol/L (ref 3.5–5.1)
SGOT(AST): 28 U/L (ref 15–37)
SGPT (ALT): 34 U/L
Total Protein: 7.2 g/dL (ref 6.4–8.2)

## 2011-12-27 LAB — CBC WITH DIFFERENTIAL/PLATELET
Basophil #: 0 10*3/uL (ref 0.0–0.1)
Basophil %: 0.2 %
Eosinophil #: 0.1 10*3/uL (ref 0.0–0.7)
MCH: 30.3 pg (ref 26.0–34.0)
MCHC: 35.2 g/dL (ref 32.0–36.0)
Monocyte #: 0.7 x10 3/mm (ref 0.2–1.0)
Neutrophil %: 70.2 %
Platelet: 113 10*3/uL — ABNORMAL LOW (ref 150–440)

## 2011-12-27 LAB — LIPASE, BLOOD: Lipase: 169 U/L (ref 73–393)

## 2011-12-28 LAB — LIPASE, BLOOD: Lipase: 904 U/L — ABNORMAL HIGH (ref 73–393)

## 2011-12-28 LAB — URINE CULTURE

## 2011-12-29 LAB — CBC WITH DIFFERENTIAL/PLATELET
Basophil #: 0 10*3/uL (ref 0.0–0.1)
Basophil %: 0.4 %
Eosinophil #: 0.1 10*3/uL (ref 0.0–0.7)
Eosinophil %: 1.2 %
HCT: 27.5 % — ABNORMAL LOW (ref 40.0–52.0)
HGB: 9.5 g/dL — ABNORMAL LOW (ref 13.0–18.0)
Lymphocyte %: 18.6 %
MCH: 30 pg (ref 26.0–34.0)
MCHC: 34.4 g/dL (ref 32.0–36.0)
Monocyte %: 12.3 %
Neutrophil #: 4.2 10*3/uL (ref 1.4–6.5)
Neutrophil %: 67.5 %
Platelet: 88 10*3/uL — ABNORMAL LOW (ref 150–440)
RBC: 3.15 10*6/uL — ABNORMAL LOW (ref 4.40–5.90)

## 2011-12-29 LAB — COMPREHENSIVE METABOLIC PANEL
Alkaline Phosphatase: 159 U/L — ABNORMAL HIGH (ref 50–136)
Bilirubin,Total: 0.6 mg/dL (ref 0.2–1.0)
Calcium, Total: 8.8 mg/dL (ref 8.5–10.1)
Chloride: 94 mmol/L — ABNORMAL LOW (ref 98–107)
Co2: 25 mmol/L (ref 21–32)
Creatinine: 8.76 mg/dL — ABNORMAL HIGH (ref 0.60–1.30)
EGFR (African American): 6 — ABNORMAL LOW
EGFR (Non-African Amer.): 5 — ABNORMAL LOW
Osmolality: 281 (ref 275–301)
Potassium: 4.5 mmol/L (ref 3.5–5.1)
SGPT (ALT): 24 U/L
Total Protein: 6.6 g/dL (ref 6.4–8.2)

## 2011-12-29 LAB — CLOSTRIDIUM DIFFICILE BY PCR

## 2011-12-29 LAB — LIPASE, BLOOD: Lipase: 147 U/L (ref 73–393)

## 2011-12-29 LAB — RENAL FUNCTION PANEL
Albumin: 3.5 g/dL (ref 3.4–5.0)
BUN: 64 mg/dL — ABNORMAL HIGH (ref 7–18)
Chloride: 95 mmol/L — ABNORMAL LOW (ref 98–107)
Creatinine: 9.07 mg/dL — ABNORMAL HIGH (ref 0.60–1.30)
EGFR (African American): 6 — ABNORMAL LOW
Phosphorus: 5.6 mg/dL — ABNORMAL HIGH (ref 2.5–4.9)
Sodium: 134 mmol/L — ABNORMAL LOW (ref 136–145)

## 2011-12-29 LAB — WBCS, STOOL

## 2012-01-04 ENCOUNTER — Other Ambulatory Visit: Payer: Self-pay | Admitting: Internal Medicine

## 2012-01-05 NOTE — Telephone Encounter (Signed)
Fax Received. Refill Completed. Robert [name] (R.M.A)   

## 2012-01-08 ENCOUNTER — Encounter: Payer: Self-pay | Admitting: Internal Medicine

## 2012-01-08 ENCOUNTER — Ambulatory Visit (INDEPENDENT_AMBULATORY_CARE_PROVIDER_SITE_OTHER): Payer: Medicare Other | Admitting: *Deleted

## 2012-01-08 DIAGNOSIS — I4891 Unspecified atrial fibrillation: Secondary | ICD-10-CM

## 2012-01-08 DIAGNOSIS — Z950 Presence of cardiac pacemaker: Secondary | ICD-10-CM

## 2012-01-11 LAB — REMOTE PACEMAKER DEVICE: RV LEAD IMPEDENCE PM: 648 Ohm

## 2012-01-12 ENCOUNTER — Telehealth: Payer: Self-pay | Admitting: *Deleted

## 2012-01-12 NOTE — Telephone Encounter (Signed)
Received pacemaker remote transmission.  Device at elective replacement indicator.  Wife aware.  Appt scheduled to discuss generator change with Dr Graciela Husbands in [place].

## 2012-01-20 ENCOUNTER — Ambulatory Visit (INDEPENDENT_AMBULATORY_CARE_PROVIDER_SITE_OTHER): Payer: Medicare Other | Admitting: Internal Medicine

## 2012-01-20 ENCOUNTER — Encounter: Payer: Self-pay | Admitting: Internal Medicine

## 2012-01-20 ENCOUNTER — Encounter: Payer: Medicare Other | Admitting: Internal Medicine

## 2012-01-20 VITALS — BP 124/54 | HR 83 | Ht 72.0 in | Wt 229.0 lb

## 2012-01-20 DIAGNOSIS — I951 Orthostatic hypotension: Secondary | ICD-10-CM

## 2012-01-20 DIAGNOSIS — Z950 Presence of cardiac pacemaker: Secondary | ICD-10-CM

## 2012-01-20 DIAGNOSIS — I2510 Atherosclerotic heart disease of native coronary artery without angina pectoris: Secondary | ICD-10-CM

## 2012-01-20 DIAGNOSIS — Z992 Dependence on renal dialysis: Secondary | ICD-10-CM

## 2012-01-20 DIAGNOSIS — N186 End stage renal disease: Secondary | ICD-10-CM

## 2012-01-20 DIAGNOSIS — I509 Heart failure, unspecified: Secondary | ICD-10-CM

## 2012-01-20 DIAGNOSIS — I498 Other specified cardiac arrhythmias: Secondary | ICD-10-CM

## 2012-01-20 LAB — PACEMAKER DEVICE OBSERVATION
AL AMPLITUDE: 0.4801 mv
AL IMPEDENCE PM: 384 Ohm
RV LEAD AMPLITUDE: 2.7597 mv
RV LEAD IMPEDENCE PM: 600 Ohm
RV LEAD THRESHOLD: 0.5 V

## 2012-01-20 NOTE — Assessment & Plan Note (Signed)
As above.

## 2012-01-20 NOTE — Assessment & Plan Note (Signed)
Has significant limitations of activity, will undertake Myoview scanning prior to embarking device generator replacement or upgrade

## 2012-01-20 NOTE — Assessment & Plan Note (Signed)
As above  he may be a candidate for ACE inhibitor therapy

## 2012-01-20 NOTE — Assessment & Plan Note (Signed)
The patient's device has reached ERI. He is now fixed rate is 65. He needs to undergo generator replacement. He is not dependent. He paces about 20% of the time.  Assessment is left ventricular function to identify whether he is potentially a candidate for ICD therapy. He also has a broad right bundle and consideration could be given to CRT. However, he was also dialyzed ipsilateral to his device which is the risk of infection with leads placed in the veins. I would thus need a fairly high threshold for embarking on the lead placement.    Is also 10 years post grafting. Reassessment of perfusion is appropriate.

## 2012-01-20 NOTE — Progress Notes (Signed)
HPI  Robert [name] is a 71 y.o. male Seen in followup for a pacemaker implanted for bradycardia and now permanent atrial fibrillation. He has a history of prior cardiomyopathy with ejection fractions ranging in the 40-55% range. Echocardiogram February 2011 demonstrated an ejection fraction of 40%.  He has history of coronary artery disease status post bypass surgery in 2003 at which time he received a LIMA to the LAD and vein graft to intermediate circumflex and distal right  He has had problems with lower extremity claudication and leg weakness. He also apparently is having abdominal claudication with postprandial cramps diarrhea and pain for which there is consideration for revascularization done percutaneously.  His symptoms prior to bypass or shortness of breath. He is limited sufficiently down his legs he does not improved upon his dyspnea. He denies chest pain.  Past Medical History  Diagnosis Date  . CAD (coronary artery disease)     EF 40% 2011 CABG 2004  . PVD (peripheral vascular disease)   . Carotid artery disease   . Sinus node dysfunction     noted following Afluttter RFCA  . Atrial fibrillation     S/P AFlutter RFCA 2006  . Pacemaker     2006  . ESRD (end stage renal disease)     hemodialysis  . Diverticulitis     Past Surgical History  Procedure Date  . Tonsillectomy   . Bladder surgery   . Pacemaker insertion 1/24/6    Medtronic N-rhythm [id number]  . Coronary artery bypass graft     Current Outpatient Prescriptions  Medication Sig Dispense Refill  . albuterol (PROVENTIL HFA) 108 (90 BASE) MCG/ACT inhaler Inhale 2 puffs into the lungs every 6 (six) hours as needed. UAD       . aspirin 81 MG EC tablet Take 162 mg by mouth daily.        Marland Kitchen b complex vitamins tablet Take 1 tablet by mouth daily.        . carisoprodol (SOMA) 350 MG tablet Take 350 mg by mouth every 8 (eight) hours as needed.      . carvedilol (COREG) 6.25 MG tablet TAKE  1/2 TABLET BY MOUTH  TWICE DAILY ON DIAYLSIS DAYS , AND 1 TABLET TWICE DAILY ON NON-DIAYLSIS DAYS  60 tablet  4  . Cholecalciferol 1000 UNITS capsule Take 1,000 Units by mouth daily.        . Coenzyme Q10 (COQ10) 100 MG CAPS Take 1 capsule by mouth 3 (three) times daily.        Marland Kitchen dutasteride (AVODART) 0.5 MG capsule Take 0.5 mg by mouth once a week.        Marland Kitchen epoetin alfa (PROCRIT) 3000 UNIT/ML injection Inject 6,000 Units into the skin 2 (two) times a week.        . Eszopiclone (ESZOPICLONE) 3 MG TABS Take 3 mg by mouth at bedtime. Take immediately before bedtime      . levothyroxine (SYNTHROID, LEVOTHROID) 150 MCG tablet Take 150 mcg by mouth daily.        Marland Kitchen omeprazole (PRILOSEC) 40 MG capsule Take 40 mg by mouth daily.        . OxyCODONE HCl, Abuse Deter, 5 MG TABS Take 5 mg by mouth every 6 (six) hours as needed.      . Probiotic Product (PROBIOTIC FORMULA PO) Take by mouth.        . ropinirole (REQUIP) 5 MG tablet Take 5 mg by mouth 3 (three) times daily.  Allergies  Allergen Reactions  . Ciprofloxacin   . Morphine   . Niacin     Review of Systems negative except from HPI and PMH  Physical Exam BP 124/54  Pulse 83  Ht 6' (1.829 m)  Wt 229 lb (103.874 kg)  BMI 31.06 kg/m2 Well developed and well nourished in no acute distress HENT normal E scleral and icterus clear Neck Supple JVP flat; carotids brisk and full Clear to ausculation Device pocket well healed; without hematoma or erythema Regular rate and rhythm, no murmurs gallops or rub Soft with active bowel sounds No clubbing cyanosis noneThe Edema Alert and oriented, grossly normal motor and sensory function Skin Warm and Dry  ecg af with occ pacing  Assessment and  Plan

## 2012-01-20 NOTE — Assessment & Plan Note (Signed)
He may benefit from ProAmatine therapy on dialysis days

## 2012-01-20 NOTE — Patient Instructions (Addendum)
You are scheduled for a Lexiscan Myoview on 01/29/12 at 7:00am at [facility]  You are scheduled for a generator change at [facility] on 02/10/12.  We will call you with your time and instructions.

## 2012-01-21 ENCOUNTER — Telehealth: Payer: Self-pay

## 2012-01-21 NOTE — Telephone Encounter (Signed)
LMTCB re: instructions for upcoming PPM gen changeout Also mailed instructions to pt

## 2012-01-21 NOTE — Telephone Encounter (Signed)
Pt's wife called back. Verbal instructions given to wife and pt re: PPM gen change out.  Wife says pt has dialysis on day of pre op and wants to know if this can be changed.  R/S to 9/3 at 0945.  Wife verbalizes understanding.

## 2012-01-29 ENCOUNTER — Ambulatory Visit: Payer: Self-pay | Admitting: Cardiovascular Disease

## 2012-01-29 DIAGNOSIS — R0602 Shortness of breath: Secondary | ICD-10-CM

## 2012-02-03 ENCOUNTER — Ambulatory Visit: Payer: Self-pay | Admitting: Internal Medicine

## 2012-02-03 LAB — CBC WITH DIFFERENTIAL/PLATELET
Basophil #: 0 10*3/uL (ref 0.0–0.1)
Basophil %: 0.4 %
HCT: 32 % — ABNORMAL LOW (ref 40.0–52.0)
HGB: 10.9 g/dL — ABNORMAL LOW (ref 13.0–18.0)
MCHC: 34.1 g/dL (ref 32.0–36.0)
MCV: 90 fL (ref 80–100)
Monocyte %: 11.5 %
Neutrophil %: 63.9 %
Platelet: 116 10*3/uL — ABNORMAL LOW (ref 150–440)
RBC: 3.54 10*6/uL — ABNORMAL LOW (ref 4.40–5.90)
RDW: 16.6 % — ABNORMAL HIGH (ref 11.5–14.5)
WBC: 6 10*3/uL (ref 3.8–10.6)

## 2012-02-03 LAB — BASIC METABOLIC PANEL
BUN: 45 mg/dL — ABNORMAL HIGH (ref 7–18)
Chloride: 100 mmol/L (ref 98–107)
Creatinine: 7.34 mg/dL — ABNORMAL HIGH (ref 0.60–1.30)
Potassium: 4.7 mmol/L (ref 3.5–5.1)

## 2012-02-03 LAB — APTT: Activated PTT: 37.9 secs — ABNORMAL HIGH (ref 23.6–35.9)

## 2012-02-03 LAB — PROTIME-INR: Prothrombin Time: 15.8 secs — ABNORMAL HIGH (ref 11.5–14.7)

## 2012-02-05 ENCOUNTER — Telehealth: Payer: Self-pay | Admitting: Internal Medicine

## 2012-02-05 NOTE — Telephone Encounter (Signed)
I called wife back. She asks if pt is going to have a PPM gen change out or an upgrade to ICD. She says pt has Lexiscan performed at [facility] and has not heard results. She also asks about CXR/lab results done preop. She then asks if she should change day/time of dialysis she gives pt at home on M,W,F. Procedure is scheduled for Tues 9/10 and pt is to be there at 0600. He usually has dialysis on Wed and she asks if she should change day/time of dialysis on wed or if hosp will take care of this. I told her I would review labs, CXR and Lexi with Dr. Graciela Husbands, as well as address dialysis questions with him and will call her back. Understanding verb.

## 2012-02-05 NOTE — Telephone Encounter (Signed)
Pt wife called they have A LOT of questions concerning pt procedure.

## 2012-02-05 NOTE — Telephone Encounter (Signed)
New Problem:    Called in wanting to speak with you about

## 2012-02-06 NOTE — Telephone Encounter (Signed)
You are corerct  Generator replaceement only, with no new leads anticipated Thanks steve

## 2012-02-06 NOTE — Telephone Encounter (Signed)
Wife asks if you are going to upgrade pt to ICD when you do gen change 9/10. EF=50% on lexi/was also neg for ischemia.  She also asks if hospital will provide dialysis for pt postoperatively. She give pt dialysis at hom,e M,W,F. Change out is scheduled for a Tuesday.

## 2012-02-06 NOTE — Telephone Encounter (Signed)
Pt's wife informed Understanding verb 

## 2012-02-06 NOTE — Telephone Encounter (Signed)
I was unable to speak with Dr. Graciela Husbands before he left office. In reviewing lexiscan results, it was negative for ischemia and showed EF of 50%. I will explain to wife Dr. Graciela Husbands will most likely keep device as PPM only vs. Upgrading to ICD since EF is not <35%. Lab results revealed elevated BUN/Creat but this is chronic for pt/on dialysis.  I will have Dr. Graciela Husbands address dialysis timing post op.

## 2012-02-09 ENCOUNTER — Telehealth: Payer: Self-pay

## 2012-02-09 NOTE — Telephone Encounter (Signed)
Message copied by Marcelle Overlie on Mon Feb 09, 2012  8:21 AM ------      Message from: Duke Salvia      Created: Fri Feb 06, 2012  5:25 PM      Regarding: RE: PPM gen change       prob 2 gm and yes THANKS       ----- Message -----         From: Marcelle Overlie, RN         Sent: 02/06/2012   7:31 AM           To: Duke Salvia, MD      Subject: PPM gen change                                           Pre admissions at [facility] wants to know if you need Ancef ordered for pt for gen change scheduled for 9/10. If so, do you want 1 G?

## 2012-02-09 NOTE — Telephone Encounter (Signed)
Order faxed to preadmit [phone]

## 2012-02-10 ENCOUNTER — Ambulatory Visit: Payer: Self-pay | Admitting: Internal Medicine

## 2012-02-20 ENCOUNTER — Ambulatory Visit (INDEPENDENT_AMBULATORY_CARE_PROVIDER_SITE_OTHER): Payer: Medicare Other | Admitting: *Deleted

## 2012-02-20 ENCOUNTER — Ambulatory Visit: Payer: Medicare Other

## 2012-02-20 DIAGNOSIS — I509 Heart failure, unspecified: Secondary | ICD-10-CM

## 2012-02-20 DIAGNOSIS — I498 Other specified cardiac arrhythmias: Secondary | ICD-10-CM

## 2012-02-20 LAB — PACEMAKER DEVICE OBSERVATION
BATTERY VOLTAGE: 2.79 V
VENTRICULAR PACING PM: 35

## 2012-02-20 NOTE — Progress Notes (Signed)
Wound check-PPM 

## 2012-02-24 ENCOUNTER — Other Ambulatory Visit: Payer: Self-pay | Admitting: Internal Medicine

## 2012-02-24 DIAGNOSIS — I2510 Atherosclerotic heart disease of native coronary artery without angina pectoris: Secondary | ICD-10-CM

## 2012-03-01 ENCOUNTER — Telehealth: Payer: Self-pay | Admitting: Internal Medicine

## 2012-03-01 NOTE — Telephone Encounter (Signed)
Pt wife calling states that pt received new home monitoring machine but the return label for the old one was not included in the packing as they were told it would be. Needs to know how to get it or how to ship it back. Also pt has had very bad headaches since last dialysis and wants to know what it safe for him to take other than tylenol.

## 2012-03-01 NOTE — Telephone Encounter (Signed)
Return label and carton ordered.  Patient was referred to pcp for question of pain relief, wife aware.

## 2012-03-15 ENCOUNTER — Ambulatory Visit: Payer: Self-pay | Admitting: Pain Medicine

## 2012-03-16 ENCOUNTER — Encounter: Payer: Self-pay | Admitting: Cardiovascular Disease

## 2012-03-23 ENCOUNTER — Ambulatory Visit: Payer: Self-pay | Admitting: Pain Medicine

## 2012-03-29 ENCOUNTER — Telehealth: Payer: Self-pay | Admitting: Internal Medicine

## 2012-03-29 NOTE — Telephone Encounter (Signed)
bp is running low at dialysis. 104/40. Pt wants to know should he see Graciela Husbands or someone

## 2012-03-29 NOTE — Telephone Encounter (Signed)
Pt says he has dialysis M,W,F. For the past month he has been having severe episodes of hypotension. He says he is holding coreg prior to dialysis and then takes only 1/2 tablet on dialysis days.  This am his BP was 104/40 upon leaving dialysis. He says this was after they bolused him with Normal Saline. He is concerned b/c he is having all of this fluid "pulled off" but they are always having to bolus him with fluids at end. They only BP altering med he takes is coreg. He would like to come in and discuss. I explained Dr. Graciela Husbands has no available appts until end of November. I offered to work him in with another MD. He is ok with this and was worked in with Dr. Elease Hashimoto 03/31/12 at 3:15.

## 2012-03-31 ENCOUNTER — Ambulatory Visit (INDEPENDENT_AMBULATORY_CARE_PROVIDER_SITE_OTHER): Payer: Medicare Other | Admitting: Cardiovascular Disease

## 2012-03-31 ENCOUNTER — Encounter: Payer: Self-pay | Admitting: Cardiovascular Disease

## 2012-03-31 VITALS — BP 111/58 | HR 85 | Ht 72.0 in | Wt 234.2 lb

## 2012-03-31 DIAGNOSIS — I951 Orthostatic hypotension: Secondary | ICD-10-CM

## 2012-03-31 DIAGNOSIS — I2510 Atherosclerotic heart disease of native coronary artery without angina pectoris: Secondary | ICD-10-CM

## 2012-03-31 MED ORDER — METOPROLOL TARTRATE 25 MG PO TABS: 12.5000 mg | ORAL_TABLET | Freq: Two times a day (BID) | ORAL | Status: DC

## 2012-03-31 NOTE — Progress Notes (Signed)
Robert [name] Date of Birth  02/19/1941       [facility] [address]  [address], [address] [place], [place]  [address]   [place], [place]  [address] [phone]     [phone]   Fax  [phone]    Fax [phone]  Problem List: 1. PVD - multiple stents, left CEA, bilateral carotid bruits 2. Pacer 3. ESRD - s/p cryoablation of 1 kidney , the 2nd kidney was not enough. 4. CHF- EF 40%  5. CAD - CABG 2003 6. Atrial Fib 7. RBBB 8. COPD - on O2 at night.  History of Present Illness:  Robert [name] is a 71 yo who presents for evaluation of episodes of hypotension at the end of dialysis.  He has had these episodes of  Hypotension for the past several months.  His overall health is fairly poor.  He has significant CAD and vascular disease.  He does not exercise - walks with a cane ( and slowly at that)  Current Outpatient Prescriptions on File Prior to Visit  Medication Sig Dispense Refill  . albuterol (PROVENTIL HFA) 108 (90 BASE) MCG/ACT inhaler Inhale 2 puffs into the lungs every 6 (six) hours as needed. UAD       . aspirin 81 MG EC tablet Take 162 mg by mouth daily.        Marland Kitchen b complex vitamins tablet Take 1 tablet by mouth daily.        . carisoprodol (SOMA) 350 MG tablet Take 350 mg by mouth every 8 (eight) hours as needed.      . carvedilol (COREG) 6.25 MG tablet TAKE  1/2 TABLET BY MOUTH TWICE DAILY ON DIAYLSIS DAYS , AND 1 TABLET TWICE DAILY ON NON-DIAYLSIS DAYS  60 tablet  4  . Cholecalciferol 1000 UNITS capsule Take 1,000 Units by mouth daily.        . Coenzyme Q10 (COQ10) 100 MG CAPS Take 1 capsule by mouth 2 (two) times daily.       Marland Kitchen dutasteride (AVODART) 0.5 MG capsule Take 0.5 mg by mouth once a week.        Marland Kitchen epoetin alfa (PROCRIT) 3000 UNIT/ML injection Inject 6,000 Units into the skin 2 (two) times a week.        . levothyroxine (SYNTHROID, LEVOTHROID) 150 MCG tablet Take 150 mcg by mouth daily.        Marland Kitchen omeprazole  (PRILOSEC) 40 MG capsule Take 40 mg by mouth daily.        . OxyCODONE HCl, Abuse Deter, 5 MG TABS Take 5 mg by mouth every 6 (six) hours as needed.      . Probiotic Product (PROBIOTIC FORMULA PO) Take by mouth.        . ropinirole (REQUIP) 5 MG tablet Take 5 mg by mouth 3 (three) times daily.        Allergies  Allergen Reactions  . Ciprofloxacin   . Morphine   . Niacin     Past Medical History  Diagnosis Date  . CAD (coronary artery disease)     EF 40% 2011 CABG 2004  . PVD (peripheral vascular disease)   . Carotid artery disease   . Sinus node dysfunction     noted following Afluttter RFCA  . Atrial fibrillation     S/P AFlutter RFCA 2006  . Pacemaker     2006  . ESRD (end stage renal disease)  hemodialysis  . Diverticulitis     Past Surgical History  Procedure Date  . Tonsillectomy   . Bladder surgery   . Pacemaker insertion 1/24/6    Medtronic N-rhythm P1501DR  . Coronary artery bypass graft   . Insert / replace / remove pacemaker     Medtronic    History  Smoking status  . Former Smoker  Smokeless tobacco  . Not on file  Comment: quit in 2003    History  Alcohol Use No    History reviewed. No pertinent family history.  Reviw of Systems:  Reviewed in the HPI.  All other systems are negative.  Physical Exam: Blood pressure 111/58, pulse 85, height 6' (1.829 m), weight 234 lb 4 oz (106.255 kg). General: Well developed, well nourished, in no acute distress.  Head: Normocephalic, atraumatic, sclera non-icteric, mucus membranes are moist,   Neck: Supple. Carotids are 2 + without bruits. No JVD  Lungs: Clear bilaterally to auscultation.  Heart: regular rate.  normal  S1 S2. No murmurs, gallops or rubs.  Abdomen: Soft, non-tender, non-distended with normal bowel sounds. No hepatomegaly. No rebound/guarding. No masses.  Msk:  Strength and tone are normal  Extremities: No clubbing or cyanosis. No edema.  Distal pedal pulses are 2+ and equal  bilaterally.  Neuro: Alert and oriented X 3. Moves all extremities spontaneously.  Psych:  Responds to questions appropriately with a normal affect.  ECG: Oct. 30, 2013 - Atrial fibrillation at 41.  RBBB.   Assessment / Plan:

## 2012-03-31 NOTE — Patient Instructions (Addendum)
Your physician has recommended you make the following change in your medication:  -stop coreg/carvedilol -start metoprolol 12.5 mg twice daily ONLY ON NON-DIALYSIS DAYS  Keep follow up with Dr. Graciela Husbands as scheduled

## 2012-03-31 NOTE — Assessment & Plan Note (Signed)
Robert [name] continues to have episodes of severe hypotension at the end of dialysis.  This may be slightly better with the reduction of his coreg dose.  I think the nephrologist may be pulling too much fluid off with dialysis.  Alternatively, we can try low metoprolol instead of Coreg to control his A-fib rate.    We will DC his Coreg.  Start metoprolol 12.5 on his non-dialysis days.  If this does not solve the problem, he will probably need to have his dry weight raised slightly.  He will return to see Dr Graciela Husbands in 1 month.

## 2012-04-08 ENCOUNTER — Other Ambulatory Visit: Payer: Self-pay | Admitting: *Deleted

## 2012-04-08 NOTE — Telephone Encounter (Signed)
Fax Received. Refill Completed. Robert [name] (R.M.A)   

## 2012-04-13 ENCOUNTER — Ambulatory Visit: Payer: Self-pay | Admitting: Vascular Surgery

## 2012-04-15 ENCOUNTER — Ambulatory Visit: Payer: Self-pay | Admitting: Pain Medicine

## 2012-04-30 ENCOUNTER — Encounter: Payer: Self-pay | Admitting: *Deleted

## 2012-05-06 ENCOUNTER — Ambulatory Visit (INDEPENDENT_AMBULATORY_CARE_PROVIDER_SITE_OTHER): Payer: Medicare Other | Admitting: Internal Medicine

## 2012-05-06 ENCOUNTER — Encounter: Payer: Self-pay | Admitting: Internal Medicine

## 2012-05-06 VITALS — BP 110/48 | HR 79 | Ht 72.0 in | Wt 243.0 lb

## 2012-05-06 DIAGNOSIS — I4891 Unspecified atrial fibrillation: Secondary | ICD-10-CM

## 2012-05-06 DIAGNOSIS — I951 Orthostatic hypotension: Secondary | ICD-10-CM

## 2012-05-06 DIAGNOSIS — R42 Dizziness and giddiness: Secondary | ICD-10-CM

## 2012-05-06 DIAGNOSIS — Z950 Presence of cardiac pacemaker: Secondary | ICD-10-CM

## 2012-05-06 DIAGNOSIS — I2510 Atherosclerotic heart disease of native coronary artery without angina pectoris: Secondary | ICD-10-CM

## 2012-05-06 MED ORDER — MIDODRINE HCL 5 MG PO TABS: ORAL_TABLET | ORAL | Status: DC

## 2012-05-06 NOTE — Assessment & Plan Note (Signed)
Stable. We'll continue his low-dose beta blocker

## 2012-05-06 NOTE — Progress Notes (Signed)
Patient has no care team.   HPI  Robert [name] is a 71 y.o. male Seen in followup for a pacemaker implanted for bradycardia and now permanent atrial fibrillation. He underwent generator placement 9/13    He has a history of prior cardiomyopathy with ejection fractions ranging in the 40-55% range. Echocardiogram February 2011 demonstrated an ejection fraction of 40%.  He has history of coronary artery disease status post bypass surgery in 2003 at which time he received a LIMA to the LAD and vein graft to intermediate circumflex and distal right    He does not take Coumadin.  He is end-stage renal disease and on dialysis  His major complaint is low blood pressure and  weakness and dizziness that occur as a consequence. This is particularly bad on dialysis days. He takes his Lopressor on nondialysis days.   Past Medical History  Diagnosis Date  . CAD (coronary artery disease)     EF 40% 2011 CABG 2004  . PVD (peripheral vascular disease)   . Carotid artery disease   . Sinus node dysfunction     noted following Afluttter RFCA  . Atrial fibrillation     S/P AFlutter RFCA 2006  . Pacemaker     2006  . ESRD (end stage renal disease)     hemodialysis  . Diverticulitis     Past Surgical History  Procedure Date  . Tonsillectomy   . Bladder surgery   . Pacemaker insertion 1/24/6    Medtronic N-rhythm P1501DR  . Coronary artery bypass graft   . Insert / replace / remove pacemaker     Medtronic    Current Outpatient Prescriptions  Medication Sig Dispense Refill  . albuterol (PROVENTIL HFA) 108 (90 BASE) MCG/ACT inhaler Inhale 2 puffs into the lungs every 6 (six) hours as needed. UAD       . aspirin 81 MG EC tablet Take 162 mg by mouth daily.        Marland Kitchen b complex vitamins tablet Take 1 tablet by mouth daily.        . carisoprodol (SOMA) 350 MG tablet Take 350 mg by mouth every 8 (eight) hours as needed.      . Cholecalciferol 1000 UNITS capsule Take 1,000 Units by mouth daily.         . Coenzyme Q10 (COQ10) 100 MG CAPS Take 1 capsule by mouth 2 (two) times daily.       Marland Kitchen epoetin alfa (PROCRIT) 3000 UNIT/ML injection Inject 6,000 Units into the skin 2 (two) times a week.        . finasteride (PROSCAR) 5 MG tablet Take 5 mg by mouth daily.      Marland Kitchen HYDROcodone-acetaminophen (NORCO/VICODIN) 5-325 MG per tablet Take 1 tablet by mouth every 6 (six) hours as needed.      Marland Kitchen levothyroxine (SYNTHROID, LEVOTHROID) 150 MCG tablet Take 150 mcg by mouth daily.        . metoprolol tartrate (LOPRESSOR) 25 MG tablet Take 0.5 tablets (12.5 mg total) by mouth 2 (two) times daily.  30 tablet  3  . omeprazole (PRILOSEC) 40 MG capsule Take 40 mg by mouth daily.        . Probiotic Product (PROBIOTIC FORMULA PO) Take by mouth.        . ropinirole (REQUIP) 5 MG tablet Take 5 mg by mouth 3 (three) times daily.        Allergies  Allergen Reactions  . Ciprofloxacin   . Morphine   . Niacin  Review of Systems negative except from HPI and PMH  Physical Exam BP 110/48  Pulse 79  Ht 6' (1.829 m)  Wt 243 lb (110.224 kg)  BMI 32.96 kg/m2 Well developed and well nourished in no acute distress HENT normal E scleral and icterus clear Neck Supple   Clear to ausculation Irregularly irregular rate and rhythm, 2/6 systolic M  Soft with active bowel sounds No clubbing cyanosis Trace Edema Alert and oriented, grossly normal motor and sensory function Skin Warm and Dry  ECG demonstrates atrial fibrillation with a reasonable ventricular response rate is 79 Intervals-/18/42 Right bundle branch block  Assessment and  Plan

## 2012-05-06 NOTE — Patient Instructions (Addendum)
Your physician wants you to follow-up in 3 months with Dr. Graciela Husbands. You will receive a reminder letter in the mail two months in advance. If you don't receive a letter, please call our office to schedule the follow-up appointment.  Your physician has recommended you make the following change in your medication:  -start proamatine 5 mg every 4 hours while awake as needed

## 2012-05-06 NOTE — Assessment & Plan Note (Signed)
This continues to be quite problematic. We will start him on ProAmatine 5 mg taken every 4 hours while awake. He is to confer with his nephrologist as well

## 2012-05-06 NOTE — Assessment & Plan Note (Signed)
Atrial fibrillation with permanent. He is not on warfarin. This will need to be readdressed although with the dialysis is not clear as to what the risk benefit is

## 2012-05-06 NOTE — Assessment & Plan Note (Signed)
The patient's device was interrogated.  The information was reviewed. No changes were made in the programming.    

## 2012-05-11 ENCOUNTER — Ambulatory Visit: Payer: Self-pay | Admitting: Vascular Surgery

## 2012-05-11 LAB — BASIC METABOLIC PANEL
Anion Gap: 8 (ref 7–16)
Chloride: 108 mmol/L — ABNORMAL HIGH (ref 98–107)
Co2: 24 mmol/L (ref 21–32)
EGFR (African American): 9 — ABNORMAL LOW
Glucose: 146 mg/dL — ABNORMAL HIGH (ref 65–99)
Osmolality: 292 (ref 275–301)
Potassium: 4.3 mmol/L (ref 3.5–5.1)

## 2012-05-25 ENCOUNTER — Ambulatory Visit: Payer: Self-pay | Admitting: Internal Medicine

## 2012-05-25 LAB — HEPATIC FUNCTION PANEL A (ARMC)
Albumin: 3.6 g/dL (ref 3.4–5.0)
Alkaline Phosphatase: 157 U/L — ABNORMAL HIGH (ref 50–136)
Bilirubin, Direct: 0.1 mg/dL (ref 0.00–0.20)
Bilirubin,Total: 0.3 mg/dL (ref 0.2–1.0)
SGOT(AST): 16 U/L (ref 15–37)
Total Protein: 7.7 g/dL (ref 6.4–8.2)

## 2012-05-25 LAB — RETICULOCYTES
Absolute Retic Count: 0.116 10*6/uL (ref 0.031–0.129)
Reticulocyte: 3.97 % — ABNORMAL HIGH (ref 0.7–2.5)

## 2012-05-25 LAB — CBC CANCER CENTER
Basophil #: 0 x10 3/mm (ref 0.0–0.1)
Eosinophil #: 0.1 x10 3/mm (ref 0.0–0.7)
Lymphocyte #: 1.2 x10 3/mm (ref 1.0–3.6)
Lymphocyte %: 18.7 %
MCH: 28.2 pg (ref 26.0–34.0)
MCHC: 32.2 g/dL (ref 32.0–36.0)
Monocyte #: 0.9 x10 3/mm (ref 0.2–1.0)
Neutrophil #: 4.1 x10 3/mm (ref 1.4–6.5)
RDW: 17.7 % — ABNORMAL HIGH (ref 11.5–14.5)

## 2012-06-01 LAB — OCCULT BLOOD X 1 CARD TO LAB, STOOL
Occult Blood, Feces: NEGATIVE
Occult Blood, Feces: NEGATIVE
Occult Blood, Feces: NEGATIVE

## 2012-06-02 ENCOUNTER — Ambulatory Visit: Payer: Self-pay | Admitting: Internal Medicine

## 2012-06-09 ENCOUNTER — Ambulatory Visit: Payer: Self-pay | Admitting: Internal Medicine

## 2012-06-09 LAB — CANCER CENTER HEMOGLOBIN: HGB: 8.1 g/dL — ABNORMAL LOW (ref 13.0–18.0)

## 2012-06-16 ENCOUNTER — Telehealth: Payer: Self-pay | Admitting: *Deleted

## 2012-06-16 NOTE — Telephone Encounter (Signed)
Is he cleared for surg?

## 2012-06-16 NOTE — Telephone Encounter (Signed)
Pt needs cardiac clearance for left femoral endarterectomy. No appt scheduled yet. Please fax clearance to [phone]

## 2012-06-17 ENCOUNTER — Ambulatory Visit: Payer: Self-pay | Admitting: Urology

## 2012-06-18 NOTE — Telephone Encounter (Signed)
Dr. Graciela Husbands on vacation and will return 1/20 Will answer at that time

## 2012-06-18 NOTE — Telephone Encounter (Signed)
Dr Gilda Crease office calling back about clearance

## 2012-06-18 NOTE — Telephone Encounter (Signed)
Please advise if cleared for surg thanks

## 2012-06-21 NOTE — Telephone Encounter (Signed)
Letter faxed to # provided 

## 2012-06-21 NOTE — Telephone Encounter (Signed)
With neg myoview in Oct 13, if there are no changes in symptoms, his risk should be sufficiently  Low  He should continue his betablockers

## 2012-06-24 LAB — CBC CANCER CENTER
Basophil #: 0.1 x10 3/mm (ref 0.0–0.1)
Basophil %: 0.8 %
Eosinophil %: 1.9 %
HCT: 24.6 % — ABNORMAL LOW (ref 40.0–52.0)
Lymphocyte %: 23.9 %
MCH: 27.4 pg (ref 26.0–34.0)
MCV: 86 fL (ref 80–100)
Monocyte %: 14.1 %
Neutrophil #: 4 x10 3/mm (ref 1.4–6.5)
RBC: 2.87 10*6/uL — ABNORMAL LOW (ref 4.40–5.90)

## 2012-06-24 LAB — IRON AND TIBC
Iron Saturation: 15 %
Iron: 42 ug/dL — ABNORMAL LOW (ref 65–175)
Unbound Iron-Bind.Cap.: 235 ug/dL

## 2012-06-24 LAB — FERRITIN: Ferritin (ARMC): 88 ng/mL (ref 8–388)

## 2012-06-29 ENCOUNTER — Ambulatory Visit: Payer: Self-pay | Admitting: Vascular Surgery

## 2012-06-29 LAB — BASIC METABOLIC PANEL
Anion Gap: 8 (ref 7–16)
BUN: 52 mg/dL — ABNORMAL HIGH (ref 7–18)
Calcium, Total: 9.3 mg/dL (ref 8.5–10.1)
Chloride: 104 mmol/L (ref 98–107)
Co2: 25 mmol/L (ref 21–32)
Creatinine: 7.49 mg/dL — ABNORMAL HIGH (ref 0.60–1.30)
EGFR (Non-African Amer.): 6 — ABNORMAL LOW
Potassium: 5.2 mmol/L — ABNORMAL HIGH (ref 3.5–5.1)
Sodium: 137 mmol/L (ref 136–145)

## 2012-06-29 LAB — CBC
HCT: 23.6 % — ABNORMAL LOW (ref 40.0–52.0)
HGB: 7.4 g/dL — ABNORMAL LOW (ref 13.0–18.0)
MCH: 26.8 pg (ref 26.0–34.0)
MCHC: 31.4 g/dL — ABNORMAL LOW (ref 32.0–36.0)
MCV: 85 fL (ref 80–100)
Platelet: 147 10*3/uL — ABNORMAL LOW (ref 150–440)
RBC: 2.76 10*6/uL — ABNORMAL LOW (ref 4.40–5.90)
RDW: 18.1 % — ABNORMAL HIGH (ref 11.5–14.5)
WBC: 6 10*3/uL (ref 3.8–10.6)

## 2012-06-30 ENCOUNTER — Inpatient Hospital Stay: Payer: Self-pay | Admitting: Vascular Surgery

## 2012-06-30 LAB — HEMOGLOBIN: HGB: 7.9 g/dL — ABNORMAL LOW (ref 13.0–18.0)

## 2012-07-01 LAB — CBC WITH DIFFERENTIAL/PLATELET
Basophil #: 0 10*3/uL (ref 0.0–0.1)
Eosinophil #: 0 10*3/uL (ref 0.0–0.7)
HCT: 24 % — ABNORMAL LOW (ref 40.0–52.0)
HGB: 7.5 g/dL — ABNORMAL LOW (ref 13.0–18.0)
MCH: 27 pg (ref 26.0–34.0)
MCHC: 31.3 g/dL — ABNORMAL LOW (ref 32.0–36.0)
MCV: 86 fL (ref 80–100)
Monocyte #: 0.9 x10 3/mm (ref 0.2–1.0)
Monocyte %: 11.8 %
Platelet: 120 10*3/uL — ABNORMAL LOW (ref 150–440)
RBC: 2.79 10*6/uL — ABNORMAL LOW (ref 4.40–5.90)
RDW: 16.5 % — ABNORMAL HIGH (ref 11.5–14.5)
WBC: 7.7 10*3/uL (ref 3.8–10.6)

## 2012-07-01 LAB — BASIC METABOLIC PANEL
Anion Gap: 13 (ref 7–16)
Chloride: 104 mmol/L (ref 98–107)
Creatinine: 10.35 mg/dL — ABNORMAL HIGH (ref 0.60–1.30)
EGFR (African American): 5 — ABNORMAL LOW
EGFR (Non-African Amer.): 4 — ABNORMAL LOW
Glucose: 132 mg/dL — ABNORMAL HIGH (ref 65–99)
Osmolality: 300 (ref 275–301)
Potassium: 6.3 mmol/L — ABNORMAL HIGH (ref 3.5–5.1)
Sodium: 137 mmol/L (ref 136–145)

## 2012-07-02 LAB — HEMOGLOBIN: HGB: 9 g/dL — ABNORMAL LOW (ref 13.0–18.0)

## 2012-07-02 LAB — PHOSPHORUS: Phosphorus: 4.7 mg/dL (ref 2.5–4.9)

## 2012-07-03 ENCOUNTER — Ambulatory Visit: Payer: Self-pay | Admitting: Internal Medicine

## 2012-07-06 ENCOUNTER — Ambulatory Visit: Payer: Self-pay | Admitting: Internal Medicine

## 2012-07-07 ENCOUNTER — Ambulatory Visit: Payer: Self-pay | Admitting: Internal Medicine

## 2012-07-20 ENCOUNTER — Inpatient Hospital Stay: Payer: Self-pay | Admitting: Internal Medicine

## 2012-07-20 LAB — COMPREHENSIVE METABOLIC PANEL
Albumin: 2.9 g/dL — ABNORMAL LOW (ref 3.4–5.0)
Alkaline Phosphatase: 203 U/L — ABNORMAL HIGH (ref 50–136)
Anion Gap: 11 (ref 7–16)
Calcium, Total: 8.9 mg/dL (ref 8.5–10.1)
Creatinine: 7.47 mg/dL — ABNORMAL HIGH (ref 0.60–1.30)
Osmolality: 284 (ref 275–301)
Potassium: 4.7 mmol/L (ref 3.5–5.1)
SGOT(AST): 19 U/L (ref 15–37)
SGPT (ALT): 12 U/L (ref 12–78)
Sodium: 134 mmol/L — ABNORMAL LOW (ref 136–145)
Total Protein: 7.3 g/dL (ref 6.4–8.2)

## 2012-07-20 LAB — CBC WITH DIFFERENTIAL/PLATELET
Basophil #: 0.1 10*3/uL (ref 0.0–0.1)
Basophil %: 0.6 %
Eosinophil %: 0 %
HCT: 30.8 % — ABNORMAL LOW (ref 40.0–52.0)
HGB: 9.5 g/dL — ABNORMAL LOW (ref 13.0–18.0)
Lymphocyte #: 1.2 10*3/uL (ref 1.0–3.6)
MCH: 26.5 pg (ref 26.0–34.0)
Neutrophil %: 83.5 %
Platelet: 171 10*3/uL (ref 150–440)
RBC: 3.6 10*6/uL — ABNORMAL LOW (ref 4.40–5.90)
RDW: 17.8 % — ABNORMAL HIGH (ref 11.5–14.5)
WBC: 17.4 10*3/uL — ABNORMAL HIGH (ref 3.8–10.6)

## 2012-07-20 LAB — TROPONIN I: Troponin-I: 0.03 ng/mL

## 2012-07-21 LAB — BASIC METABOLIC PANEL
Anion Gap: 11 (ref 7–16)
Chloride: 95 mmol/L — ABNORMAL LOW (ref 98–107)
Co2: 28 mmol/L (ref 21–32)
Creatinine: 8.49 mg/dL — ABNORMAL HIGH (ref 0.60–1.30)
Glucose: 143 mg/dL — ABNORMAL HIGH (ref 65–99)
Osmolality: 284 (ref 275–301)
Potassium: 4.5 mmol/L (ref 3.5–5.1)

## 2012-07-21 LAB — CBC WITH DIFFERENTIAL/PLATELET
Basophil #: 0.2 10*3/uL — ABNORMAL HIGH (ref 0.0–0.1)
Eosinophil #: 0 10*3/uL (ref 0.0–0.7)
Eosinophil %: 0.2 %
HGB: 8.4 g/dL — ABNORMAL LOW (ref 13.0–18.0)
MCH: 25.6 pg — ABNORMAL LOW (ref 26.0–34.0)
MCHC: 30.2 g/dL — ABNORMAL LOW (ref 32.0–36.0)
MCV: 85 fL (ref 80–100)
Monocyte %: 12.1 %
RBC: 3.28 10*6/uL — ABNORMAL LOW (ref 4.40–5.90)
RDW: 18 % — ABNORMAL HIGH (ref 11.5–14.5)

## 2012-07-21 LAB — TROPONIN I
Troponin-I: 0.04 ng/mL
Troponin-I: 0.04 ng/mL

## 2012-07-21 LAB — CK TOTAL AND CKMB (NOT AT ARMC)
CK, Total: 234 U/L — ABNORMAL HIGH (ref 35–232)
CK-MB: 1.3 ng/mL (ref 0.5–3.6)
CK-MB: 0.9 ng/mL (ref 0.5–3.6)

## 2012-07-21 LAB — LIPID PANEL
Ldl Cholesterol, Calc: 35 mg/dL (ref 0–100)
Triglycerides: 75 mg/dL (ref 0–200)

## 2012-07-22 LAB — WBC: WBC: 11.4 10*3/uL — ABNORMAL HIGH (ref 3.8–10.6)

## 2012-07-23 LAB — BASIC METABOLIC PANEL
BUN: 61 mg/dL — ABNORMAL HIGH (ref 7–18)
Calcium, Total: 8.9 mg/dL (ref 8.5–10.1)
Co2: 26 mmol/L (ref 21–32)
Creatinine: 9 mg/dL — ABNORMAL HIGH (ref 0.60–1.30)
EGFR (African American): 6 — ABNORMAL LOW
Glucose: 96 mg/dL (ref 65–99)
Sodium: 131 mmol/L — ABNORMAL LOW (ref 136–145)

## 2012-07-23 LAB — CBC WITH DIFFERENTIAL/PLATELET
Basophil #: 0 10*3/uL (ref 0.0–0.1)
Basophil %: 0.3 %
Eosinophil %: 0.9 %
HCT: 26.5 % — ABNORMAL LOW (ref 40.0–52.0)
Lymphocyte #: 1.1 10*3/uL (ref 1.0–3.6)
MCV: 84 fL (ref 80–100)
Monocyte #: 0.9 x10 3/mm (ref 0.2–1.0)
Monocyte %: 10.5 %
Neutrophil #: 6.9 10*3/uL — ABNORMAL HIGH (ref 1.4–6.5)
RBC: 3.16 10*6/uL — ABNORMAL LOW (ref 4.40–5.90)
RDW: 17.8 % — ABNORMAL HIGH (ref 11.5–14.5)
WBC: 9.1 10*3/uL (ref 3.8–10.6)

## 2012-07-24 LAB — CBC WITH DIFFERENTIAL/PLATELET
Eosinophil %: 1.2 %
HCT: 27.3 % — ABNORMAL LOW (ref 40.0–52.0)
Lymphocyte #: 1.1 10*3/uL (ref 1.0–3.6)
MCH: 26.5 pg (ref 26.0–34.0)
Monocyte #: 0.9 x10 3/mm (ref 0.2–1.0)
Neutrophil #: 4.9 10*3/uL (ref 1.4–6.5)
Platelet: 119 10*3/uL — ABNORMAL LOW (ref 150–440)
RBC: 3.25 10*6/uL — ABNORMAL LOW (ref 4.40–5.90)
WBC: 7 10*3/uL (ref 3.8–10.6)

## 2012-07-25 LAB — CBC WITH DIFFERENTIAL/PLATELET
Basophil #: 0 10*3/uL (ref 0.0–0.1)
Basophil %: 0.5 %
Eosinophil %: 1.4 %
HCT: 26.6 % — ABNORMAL LOW (ref 40.0–52.0)
HGB: 8.2 g/dL — ABNORMAL LOW (ref 13.0–18.0)
Lymphocyte #: 1.2 10*3/uL (ref 1.0–3.6)
Lymphocyte %: 13.3 %
MCH: 25.7 pg — ABNORMAL LOW (ref 26.0–34.0)
MCHC: 30.7 g/dL — ABNORMAL LOW (ref 32.0–36.0)
MCV: 84 fL (ref 80–100)
Monocyte #: 1 x10 3/mm (ref 0.2–1.0)
Monocyte %: 11.8 %
Neutrophil #: 6.3 10*3/uL (ref 1.4–6.5)
Neutrophil %: 73 %
Platelet: 120 10*3/uL — ABNORMAL LOW (ref 150–440)
RBC: 3.18 10*6/uL — ABNORMAL LOW (ref 4.40–5.90)

## 2012-07-26 LAB — CBC WITH DIFFERENTIAL/PLATELET
Basophil #: 0 10*3/uL (ref 0.0–0.1)
Basophil %: 0.2 %
Eosinophil #: 0.1 10*3/uL (ref 0.0–0.7)
Eosinophil %: 1.6 %
HCT: 26.9 % — ABNORMAL LOW (ref 40.0–52.0)
HGB: 8.6 g/dL — ABNORMAL LOW (ref 13.0–18.0)
Lymphocyte %: 13 %
MCH: 26.7 pg (ref 26.0–34.0)
MCHC: 31.8 g/dL — ABNORMAL LOW (ref 32.0–36.0)
MCV: 84 fL (ref 80–100)
Monocyte #: 1 x10 3/mm (ref 0.2–1.0)
Monocyte %: 11 %
Platelet: 133 10*3/uL — ABNORMAL LOW (ref 150–440)
RBC: 3.21 10*6/uL — ABNORMAL LOW (ref 4.40–5.90)

## 2012-07-26 LAB — POTASSIUM: Potassium: 4.3 mmol/L (ref 3.5–5.1)

## 2012-07-26 LAB — CULTURE, BLOOD (SINGLE)

## 2012-07-26 LAB — PHOSPHORUS: Phosphorus: 5.7 mg/dL — ABNORMAL HIGH (ref 2.5–4.9)

## 2012-07-27 LAB — WOUND CULTURE

## 2012-08-02 ENCOUNTER — Other Ambulatory Visit: Payer: Self-pay | Admitting: Internal Medicine

## 2012-08-02 LAB — GENTAMICIN LEVEL, TROUGH: Gentamicin, Trough: 3.2 ug/mL (ref 0.0–2.0)

## 2012-08-09 ENCOUNTER — Other Ambulatory Visit: Payer: Self-pay

## 2012-08-09 ENCOUNTER — Encounter: Payer: Medicare Other | Admitting: *Deleted

## 2012-08-17 ENCOUNTER — Encounter: Payer: Self-pay | Admitting: *Deleted

## 2012-08-19 ENCOUNTER — Encounter: Payer: Self-pay | Admitting: Internal Medicine

## 2012-08-19 ENCOUNTER — Ambulatory Visit (INDEPENDENT_AMBULATORY_CARE_PROVIDER_SITE_OTHER): Payer: Medicare Other | Admitting: Internal Medicine

## 2012-08-19 VITALS — BP 131/64 | HR 91 | Ht 72.0 in | Wt 243.0 lb

## 2012-08-19 DIAGNOSIS — I2510 Atherosclerotic heart disease of native coronary artery without angina pectoris: Secondary | ICD-10-CM

## 2012-08-19 DIAGNOSIS — Z950 Presence of cardiac pacemaker: Secondary | ICD-10-CM

## 2012-08-19 DIAGNOSIS — R0602 Shortness of breath: Secondary | ICD-10-CM

## 2012-08-19 DIAGNOSIS — I951 Orthostatic hypotension: Secondary | ICD-10-CM

## 2012-08-19 DIAGNOSIS — I509 Heart failure, unspecified: Secondary | ICD-10-CM

## 2012-08-19 DIAGNOSIS — I498 Other specified cardiac arrhythmias: Secondary | ICD-10-CM

## 2012-08-19 DIAGNOSIS — I4891 Unspecified atrial fibrillation: Secondary | ICD-10-CM

## 2012-08-19 LAB — PACEMAKER DEVICE OBSERVATION
BATTERY VOLTAGE: 2.78 V
BMOD-0003RV: 30
BMOD-0005RV: 95 {beats}/min
VENTRICULAR PACING PM: 2

## 2012-08-19 NOTE — Assessment & Plan Note (Signed)
Stable post pacing 

## 2012-08-19 NOTE — Patient Instructions (Addendum)
Your physician wants you to follow-up in: 6 months with Dr. Graciela Husbands and device check.  [facility] transmission 11/22/12. You will receive a reminder letter in the mail two months in advance. If you don't receive a letter, please call our office to schedule the follow-up appointment.

## 2012-08-19 NOTE — Assessment & Plan Note (Signed)
Permanent  On coumadin

## 2012-08-19 NOTE — Progress Notes (Addendum)
Patient Care Team: Blocker Rosalyn Gess, MD as Referring Physician (Specialist)   HPI  Robert [name] is a 72 y.o. male Seen in followup for a pacemaker implanted for bradycardia and now permanent atrial fibrillation. He underwent generator placement 9/13    He has a history of prior cardiomyopathy with ejection fractions ranging in the 40-55% range. Echocardiogram February 2011 demonstrated an ejection fraction of 40%.  He has history of coronary artery disease status post bypass surgery in 2003 at which time he received a LIMA to the LAD and vein graft to intermediate circumflex and distal right   Echo EF   He developed end-stage renal disease and is now on dialysis  At the last visit he was complaining of orthostatic intolerance and we started proamatine>> much improved  Intercurrently he underwent lower extremity revascularizaiton which subsequently became infected  He is wearing a wound vac  Hospital records reviewed   Past Medical History  Diagnosis Date  . CAD (coronary artery disease)     EF 40% 2011 CABG 2004  . PVD (peripheral vascular disease)   . Carotid artery disease   . Sinus node dysfunction     noted following Afluttter RFCA  . Atrial fibrillation     S/P AFlutter RFCA 2006  . Pacemaker     2006  . ESRD (end stage renal disease)     hemodialysis  . Diverticulitis     Past Surgical History  Procedure Laterality Date  . Tonsillectomy    . Bladder surgery    . Pacemaker insertion  1/24/6    Medtronic N-rhythm P1501DR  . Coronary artery bypass graft    . Insert / replace / remove pacemaker      Medtronic  . Femoral endarterectomy      left lower    Current Outpatient Prescriptions  Medication Sig Dispense Refill  . albuterol (PROVENTIL HFA) 108 (90 BASE) MCG/ACT inhaler Inhale 2 puffs into the lungs every 6 (six) hours as needed. UAD       . aspirin 81 MG EC tablet Take 162 mg by mouth daily.        Marland Kitchen b complex vitamins tablet Take 1 tablet by  mouth daily.        . carisoprodol (SOMA) 350 MG tablet Take 350 mg by mouth every 8 (eight) hours as needed.      . Cholecalciferol 1000 UNITS capsule Take 2,000 Units by mouth daily.       . Coenzyme Q10 (COQ10) 100 MG CAPS Take 1 capsule by mouth 2 (two) times daily.       Marland Kitchen epoetin alfa (PROCRIT) 3000 UNIT/ML injection Inject 6,000 Units into the skin 2 (two) times a week.        . finasteride (PROSCAR) 5 MG tablet Take 5 mg by mouth daily.      Marland Kitchen gabapentin (NEURONTIN) 100 MG capsule Take 100 mg by mouth 3 (three) times daily.       Marland Kitchen HYDROcodone-acetaminophen (NORCO) 10-325 MG per tablet Take 1 tablet by mouth every 6 (six) hours as needed for pain.      Marland Kitchen insulin aspart (NOVOLOG FLEXPEN) 100 UNIT/ML injection Inject 4 Units into the skin 3 (three) times daily as needed for high blood sugar.      . levothyroxine (SYNTHROID, LEVOTHROID) 150 MCG tablet Take 150 mcg by mouth daily.        . midodrine (PROAMATINE) 5 MG tablet Take 1 tablet every 4 hours while awake as needed  120 tablet  3  . NITROSTAT 0.4 MG SL tablet Place 0.4 mg under the tongue every 5 (five) minutes as needed.       Marland Kitchen omeprazole (PRILOSEC) 40 MG capsule Take 40 mg by mouth daily.        . Probiotic Product (PROBIOTIC FORMULA PO) Take by mouth.        . ropinirole (REQUIP) 5 MG tablet Take 5 mg by mouth 3 (three) times daily.       No current facility-administered medications for this visit.    Allergies  Allergen Reactions  . Ciprofloxacin   . Morphine   . Niacin     Review of Systems negative except from HPI and PMH  Physical Exam BP 131/64  Pulse 91  Ht 6' (1.829 m)  Wt 243 lb (110.224 kg)  BMI 32.95 kg/m2 Well developed and well nourished in no acute distress sitting in a wheel chair HENT normal E scleral and icterus clear Neck Supple Clear to ausculation  Regular rate and rhythm, no murmurs gallops or rub   No clubbing cyanosis Trace Edema Alert and oriented, grossly normal motor and sensory  function Skin Warm and Dry    Assessment and  Plan

## 2012-08-19 NOTE — Assessment & Plan Note (Signed)
The patient's device was interrogated.  The information was reviewed. No changes were made in the programming.    

## 2012-08-19 NOTE — Assessment & Plan Note (Signed)
Improved on proamatine

## 2012-09-29 ENCOUNTER — Ambulatory Visit: Payer: Self-pay | Admitting: Vascular Surgery

## 2012-09-29 LAB — BASIC METABOLIC PANEL
Anion Gap: 8 (ref 7–16)
BUN: 22 mg/dL — ABNORMAL HIGH (ref 7–18)
Calcium, Total: 9.2 mg/dL (ref 8.5–10.1)
Chloride: 99 mmol/L (ref 98–107)
Co2: 27 mmol/L (ref 21–32)
EGFR (African American): 15 — ABNORMAL LOW
EGFR (Non-African Amer.): 13 — ABNORMAL LOW
Glucose: 113 mg/dL — ABNORMAL HIGH (ref 65–99)
Potassium: 4.5 mmol/L (ref 3.5–5.1)
Sodium: 134 mmol/L — ABNORMAL LOW (ref 136–145)

## 2012-10-05 ENCOUNTER — Ambulatory Visit: Payer: Self-pay | Admitting: Specialist

## 2012-11-02 ENCOUNTER — Ambulatory Visit: Payer: Self-pay | Admitting: Podiatry

## 2012-11-02 LAB — BASIC METABOLIC PANEL
BUN: 37 mg/dL — ABNORMAL HIGH (ref 7–18)
Calcium, Total: 9.2 mg/dL (ref 8.5–10.1)
Chloride: 100 mmol/L (ref 98–107)
Co2: 27 mmol/L (ref 21–32)
Creatinine: 6.73 mg/dL — ABNORMAL HIGH (ref 0.60–1.30)
EGFR (African American): 9 — ABNORMAL LOW
EGFR (Non-African Amer.): 7 — ABNORMAL LOW
Glucose: 125 mg/dL — ABNORMAL HIGH (ref 65–99)
Osmolality: 277 (ref 275–301)
Sodium: 133 mmol/L — ABNORMAL LOW (ref 136–145)

## 2012-11-02 LAB — CBC WITH DIFFERENTIAL/PLATELET
Basophil %: 0.3 %
Eosinophil %: 1.6 %
HCT: 27.2 % — ABNORMAL LOW (ref 40.0–52.0)
Lymphocyte #: 1.3 10*3/uL (ref 1.0–3.6)
MCV: 82 fL (ref 80–100)
Monocyte #: 1 x10 3/mm (ref 0.2–1.0)
Monocyte %: 15.7 %
Neutrophil #: 3.9 10*3/uL (ref 1.4–6.5)
Neutrophil %: 61.3 %
RBC: 3.31 10*6/uL — ABNORMAL LOW (ref 4.40–5.90)
RDW: 19.4 % — ABNORMAL HIGH (ref 11.5–14.5)

## 2012-11-04 ENCOUNTER — Ambulatory Visit: Payer: Self-pay | Admitting: Podiatry

## 2012-11-08 LAB — PATHOLOGY REPORT

## 2012-11-16 ENCOUNTER — Ambulatory Visit: Payer: Self-pay | Admitting: Hematology and Oncology

## 2012-11-18 ENCOUNTER — Other Ambulatory Visit: Payer: Self-pay | Admitting: Podiatry

## 2012-11-22 ENCOUNTER — Other Ambulatory Visit: Payer: Self-pay | Admitting: Nephrology

## 2012-11-22 ENCOUNTER — Ambulatory Visit (INDEPENDENT_AMBULATORY_CARE_PROVIDER_SITE_OTHER): Payer: Medicare Other | Admitting: *Deleted

## 2012-11-22 ENCOUNTER — Encounter: Payer: Self-pay | Admitting: Internal Medicine

## 2012-11-22 DIAGNOSIS — I498 Other specified cardiac arrhythmias: Secondary | ICD-10-CM

## 2012-11-22 DIAGNOSIS — T827XXD Infection and inflammatory reaction due to other cardiac and vascular devices, implants and grafts, subsequent encounter: Secondary | ICD-10-CM

## 2012-11-22 DIAGNOSIS — Z950 Presence of cardiac pacemaker: Secondary | ICD-10-CM

## 2012-11-22 LAB — WOUND CULTURE

## 2012-11-22 LAB — POTASSIUM: Potassium: 4.9 mmol/L (ref 3.5–5.1)

## 2012-11-23 ENCOUNTER — Ambulatory Visit: Payer: Self-pay | Admitting: Hematology and Oncology

## 2012-11-30 ENCOUNTER — Ambulatory Visit: Payer: Self-pay | Admitting: Internal Medicine

## 2012-11-30 LAB — REMOTE PACEMAKER DEVICE
BATTERY VOLTAGE: 2.78 V
BMOD-0005RV: 95 {beats}/min
BRDY-0002RV: 60 {beats}/min
RV LEAD IMPEDENCE PM: 616 Ohm
VENTRICULAR PACING PM: 2

## 2012-12-09 ENCOUNTER — Encounter: Payer: Medicare Other | Admitting: *Deleted

## 2012-12-09 ENCOUNTER — Encounter: Payer: Self-pay | Admitting: Internal Medicine

## 2012-12-09 ENCOUNTER — Ambulatory Visit: Payer: Self-pay | Admitting: Internal Medicine

## 2012-12-09 ENCOUNTER — Ambulatory Visit (INDEPENDENT_AMBULATORY_CARE_PROVIDER_SITE_OTHER): Payer: Medicare Other | Admitting: Internal Medicine

## 2012-12-09 VITALS — BP 129/54 | HR 88 | Ht 72.0 in | Wt 233.0 lb

## 2012-12-09 DIAGNOSIS — I4891 Unspecified atrial fibrillation: Secondary | ICD-10-CM

## 2012-12-09 DIAGNOSIS — I499 Cardiac arrhythmia, unspecified: Secondary | ICD-10-CM

## 2012-12-09 DIAGNOSIS — T827XXA Infection and inflammatory reaction due to other cardiac and vascular devices, implants and grafts, initial encounter: Secondary | ICD-10-CM

## 2012-12-09 DIAGNOSIS — Z950 Presence of cardiac pacemaker: Secondary | ICD-10-CM

## 2012-12-09 NOTE — Progress Notes (Signed)
HPI [name] returns today for followup. He is a very pleasant 72 year old man with a history of symptomatic bradycardia, chronic atrial fibrillation, coronary artery disease, chronic renal insufficiency now on hemodialysis, status post permanent pacemaker insertion. He underwent generator change over a year ago. He had problems with skin infections and toe infection. Over the last several weeks, he has noted tenderness and minimal redness at his pacemaker insertion site. Previously, the patient had been on a long course of antibiotics and to stop his antibiotics several days ago. He denies fevers chills or night sweats. No syncope. No obvious drainage from the site. Allergies  Allergen Reactions  . Ciprofloxacin   . Morphine   . Niacin      Current Outpatient Prescriptions  Medication Sig Dispense Refill  . albuterol (PROVENTIL HFA) 108 (90 BASE) MCG/ACT inhaler Inhale 2 puffs into the lungs every 6 (six) hours as needed. UAD       . aspirin 81 MG EC tablet Take 162 mg by mouth daily.        Marland Kitchen b complex vitamins tablet Take 1 tablet by mouth daily.        . carisoprodol (SOMA) 350 MG tablet Take 350 mg by mouth every 8 (eight) hours as needed.      . Cholecalciferol 1000 UNITS capsule Take 2,000 Units by mouth daily.       . Coenzyme Q10 (COQ10) 100 MG CAPS Take 1 capsule by mouth 2 (two) times daily.       Marland Kitchen epoetin alfa (PROCRIT) 3000 UNIT/ML injection Inject 6,000 Units into the skin 2 (two) times a week.        . finasteride (PROSCAR) 5 MG tablet Take 5 mg by mouth daily.      Marland Kitchen gabapentin (NEURONTIN) 100 MG capsule Take 100 mg by mouth 3 (three) times daily.       Marland Kitchen HYDROcodone-acetaminophen (NORCO) 10-325 MG per tablet Take 1 tablet by mouth every 6 (six) hours as needed for pain.      Marland Kitchen insulin aspart (NOVOLOG FLEXPEN) 100 UNIT/ML injection Inject 4 Units into the skin 3 (three) times daily as needed for high blood sugar.      . levothyroxine (SYNTHROID, LEVOTHROID) 150 MCG  tablet Take 150 mcg by mouth daily.        Marland Kitchen NITROSTAT 0.4 MG SL tablet Place 0.4 mg under the tongue every 5 (five) minutes as needed.       Marland Kitchen omeprazole (PRILOSEC) 40 MG capsule Take 40 mg by mouth daily.        . Probiotic Product (PROBIOTIC FORMULA PO) Take by mouth.        . ropinirole (REQUIP) 5 MG tablet Take 5 mg by mouth 3 (three) times daily.       No current facility-administered medications for this visit.     Past Medical History  Diagnosis Date  . CAD (coronary artery disease)     EF 40% 2011 CABG 2004  . PVD (peripheral vascular disease)   . Carotid artery disease   . Sinus node dysfunction     noted following Afluttter RFCA  . Atrial fibrillation     S/P AFlutter RFCA 2006  . Pacemaker     2006  . ESRD (end stage renal disease)     hemodialysis  . Diverticulitis     ROS:   All systems reviewed and negative except as noted in the HPI.   Past Surgical History  Procedure Laterality Date  .  Tonsillectomy    . Bladder surgery    . Pacemaker insertion  1/24/6    Medtronic N-rhythm P1501DR  . Coronary artery bypass graft    . Insert / replace / remove pacemaker      Medtronic  . Femoral endarterectomy      left lower  . Toe amputation       History reviewed. No pertinent family history.   History   Social History  . Marital Status: Married    Spouse Name: N/A    Number of Children: N/A  . Years of Education: N/A   Occupational History  . Not on file.   Social History Main Topics  . Smoking status: Former Games developer  . Smokeless tobacco: Not on file     Comment: quit in 2003  . Alcohol Use: No  . Drug Use: No  . Sexually Active: Not on file   Other Topics Concern  . Not on file   Social History Narrative   Retired, married, gets regular exercise.      BP 129/54  Pulse 88  Ht 6' (1.829 m)  Wt 233 lb (105.688 kg)  BMI 31.59 kg/m2  Physical Exam:  Chronically ill appearing 72 year old man, NAD HEENT: Unremarkable Neck:  7 cm JVD,  no thyromegally Lungs:  Clear with no wheezes, rales, or rhonchi. Pacemaker insertion site is tender with no fluctuance. A small area of erythema is present on the lateral side of the incision and pocket. No drainage. HEART:  Regular rate rhythm, no murmurs, no rubs, no clicks Abd:  soft, positive bowel sounds, no organomegally, no rebound, no guarding Ext:  2 plus pulses, no edema, no cyanosis, no clubbing, left arm dialysis site, left lower extremity wound with a boot in place. Skin:  No rashes no nodules Neuro:  CN II through XII intact, motor grossly intact  EKG - atrial fibrillation with ventricular pacing  DEVICE  Normal device function.  See PaceArt for details.   Assess/Plan:

## 2012-12-09 NOTE — Assessment & Plan Note (Signed)
His ventricular rate is well controlled. No change in medical therapy. 

## 2012-12-09 NOTE — Assessment & Plan Note (Signed)
While the pacemaker site is not diagnostic for infection, I am concerned about an indolent infection. As the patient just finished a course of antibiotics, I am inclined not to try and suppress any possible infection. As long as the patient feels well, I recommended watchful waiting. If he develops fevers and chills, but he is instructed to go to the emergency room. I will see him back in several weeks. If he has worsening of his pocket, pacemaker extraction would be recommended.

## 2012-12-09 NOTE — Assessment & Plan Note (Signed)
His device is functioning normal. We'll plan to recheck in several months.

## 2012-12-09 NOTE — Patient Instructions (Addendum)
Your physician wants you to follow-up in: 8/7 with Dr. Ladona Ridgel. You will receive a reminder letter in the mail two months in advance. If you don't receive a letter, please call our office to schedule the follow-up appointment.

## 2012-12-14 ENCOUNTER — Other Ambulatory Visit: Payer: Self-pay | Admitting: Podiatry

## 2012-12-18 LAB — WOUND CULTURE

## 2012-12-23 ENCOUNTER — Ambulatory Visit: Payer: Self-pay | Admitting: Podiatry

## 2012-12-23 LAB — CBC WITH DIFFERENTIAL/PLATELET
Basophil #: 0 10*3/uL (ref 0.0–0.1)
Eosinophil #: 0.1 10*3/uL (ref 0.0–0.7)
Eosinophil %: 1.4 %
HCT: 24.7 % — ABNORMAL LOW (ref 40.0–52.0)
HGB: 7.6 g/dL — ABNORMAL LOW (ref 13.0–18.0)
Lymphocyte #: 0.9 10*3/uL — ABNORMAL LOW (ref 1.0–3.6)
Lymphocyte %: 19 %
MCH: 25.1 pg — ABNORMAL LOW (ref 26.0–34.0)
MCHC: 30.9 g/dL — ABNORMAL LOW (ref 32.0–36.0)
Neutrophil #: 3.3 10*3/uL (ref 1.4–6.5)
Neutrophil %: 66.8 %
RBC: 3.03 10*6/uL — ABNORMAL LOW (ref 4.40–5.90)

## 2012-12-23 LAB — BASIC METABOLIC PANEL
Anion Gap: 6 — ABNORMAL LOW (ref 7–16)
BUN: 31 mg/dL — ABNORMAL HIGH (ref 7–18)
Calcium, Total: 9.4 mg/dL (ref 8.5–10.1)
Chloride: 101 mmol/L (ref 98–107)
Co2: 28 mmol/L (ref 21–32)
EGFR (Non-African Amer.): 9 — ABNORMAL LOW
Glucose: 113 mg/dL — ABNORMAL HIGH (ref 65–99)
Osmolality: 277 (ref 275–301)
Potassium: 5.1 mmol/L (ref 3.5–5.1)
Sodium: 135 mmol/L — ABNORMAL LOW (ref 136–145)

## 2012-12-27 LAB — WOUND CULTURE

## 2012-12-31 ENCOUNTER — Ambulatory Visit: Payer: Self-pay | Admitting: Internal Medicine

## 2013-01-06 ENCOUNTER — Ambulatory Visit (INDEPENDENT_AMBULATORY_CARE_PROVIDER_SITE_OTHER): Payer: Medicare Other | Admitting: Internal Medicine

## 2013-01-06 ENCOUNTER — Encounter: Payer: Self-pay | Admitting: Internal Medicine

## 2013-01-06 VITALS — BP 125/60 | HR 97 | Ht 73.0 in | Wt 233.0 lb

## 2013-01-06 DIAGNOSIS — I4891 Unspecified atrial fibrillation: Secondary | ICD-10-CM

## 2013-01-06 DIAGNOSIS — Z950 Presence of cardiac pacemaker: Secondary | ICD-10-CM

## 2013-01-06 DIAGNOSIS — R0602 Shortness of breath: Secondary | ICD-10-CM

## 2013-01-06 NOTE — Assessment & Plan Note (Signed)
His pacemaker pocket all certainly erode. I've offered him early surgery versus watchful waiting and holding off on surgery until the generator comes to the skin. He is inclined to wait. I do not think there is any way to salvage his device this time. I've counseled the patient that should he have any fevers or chills he is to go to the emergency room. If and when the device erosion through the skin, he is instructed to call us and schedule followup appointment unless he has fevers and chills which case he should go to the emergency room.

## 2013-01-06 NOTE — Patient Instructions (Addendum)
Cancell next appointment with Dr.Klein and reschedule for the next time he is in the office.  Call us right away if the pacemaker comes thru the skin.

## 2013-01-06 NOTE — Assessment & Plan Note (Signed)
His symptoms are multifactorial. He is chronic renal failure, on hemodialysis, eft ventricular dysfunction, atrial fibrillation and deconditioning. I've encouraged the patient to maintain a low-sodium diet.

## 2013-01-06 NOTE — Progress Notes (Signed)
HPI [name] returns today for followup. He is a very pleasant 72 year old man with an ischemic cardiomyopathy, chronic systolic heart failure, ejection fraction 40%, atrial flutter, status post catheter ablation, and symptomatic bradycardia, in the setting of atrial fibrillation. The patient underwent pacemaker generator change out several months ago, and has subsequently developed thinning of the skin on the lateral margin of his incision. He denies fevers or chills. There has been no drainage from the site. His skin is quite thin but he has had no drainage. He denies fevers and chills. No syncope. He has chronic peripheral edema and has had severe peripheral vascular disease status post multiple procedures.. Allergies  Allergen Reactions  . Ciprofloxacin   . Morphine   . Niacin      Current Outpatient Prescriptions  Medication Sig Dispense Refill  . albuterol (PROVENTIL HFA) 108 (90 BASE) MCG/ACT inhaler Inhale 2 puffs into the lungs every 6 (six) hours as needed. UAD       . amoxicillin-clavulanate (AUGMENTIN) 500-125 MG per tablet Take 1 tablet by mouth daily.       Marland Kitchen aspirin 81 MG EC tablet Take 162 mg by mouth daily.        Marland Kitchen b complex vitamins tablet Take 1 tablet by mouth daily.        . carisoprodol (SOMA) 350 MG tablet Take 350 mg by mouth every 8 (eight) hours as needed.      . Cholecalciferol 1000 UNITS capsule Take 2,000 Units by mouth daily.       . Coenzyme Q10 (COQ10) 100 MG CAPS Take 1 capsule by mouth 2 (two) times daily.       Marland Kitchen epoetin alfa (PROCRIT) 3000 UNIT/ML injection Inject 6,000 Units into the skin 2 (two) times a week.        . finasteride (PROSCAR) 5 MG tablet Take 5 mg by mouth daily.      Marland Kitchen gabapentin (NEURONTIN) 100 MG capsule Take 100 mg by mouth 3 (three) times daily.       Marland Kitchen HYDROcodone-acetaminophen (NORCO) 10-325 MG per tablet Take 1 tablet by mouth every 6 (six) hours as needed for pain.      Marland Kitchen insulin aspart (NOVOLOG FLEXPEN) 100 UNIT/ML injection  Inject 4 Units into the skin 3 (three) times daily as needed for high blood sugar.      . levothyroxine (SYNTHROID, LEVOTHROID) 150 MCG tablet Take 150 mcg by mouth daily.        Marland Kitchen NITROSTAT 0.4 MG SL tablet Place 0.4 mg under the tongue every 5 (five) minutes as needed.       Marland Kitchen omeprazole (PRILOSEC) 40 MG capsule Take 40 mg by mouth daily.        . Probiotic Product (PROBIOTIC FORMULA PO) Take by mouth.        . ropinirole (REQUIP) 5 MG tablet Take 5 mg by mouth 3 (three) times daily.       No current facility-administered medications for this visit.     Past Medical History  Diagnosis Date  . CAD (coronary artery disease)     EF 40% 2011 CABG 2004  . PVD (peripheral vascular disease)   . Carotid artery disease   . Sinus node dysfunction     noted following Afluttter RFCA  . Atrial fibrillation     S/P AFlutter RFCA 2006  . Pacemaker     2006  . ESRD (end stage renal disease)     hemodialysis  . Diverticulitis  ROS:   All systems reviewed and negative except as noted in the HPI.   Past Surgical History  Procedure Laterality Date  . Tonsillectomy    . Bladder surgery    . Pacemaker insertion  1/24/6    Medtronic N-rhythm [id number]  . Coronary artery bypass graft    . Insert / replace / remove pacemaker      Medtronic  . Femoral endarterectomy      left lower  . Toe amputation       Family History  Problem Relation Age of Onset  . Family history unknown: Yes     History   Social History  . Marital Status: Married    Spouse Name: N/A    Number of Children: N/A  . Years of Education: N/A   Occupational History  . Not on file.   Social History Main Topics  . Smoking status: Former Games developer  . Smokeless tobacco: Not on file     Comment: quit in 2003  . Alcohol Use: No  . Drug Use: No  . Sexually Active: Not on file   Other Topics Concern  . Not on file   Social History Narrative   Retired, married, gets regular exercise.      BP 125/60   Pulse 97  Ht 6\' 1"  (1.854 m)  Wt 233 lb (105.688 kg)  BMI 30.75 kg/m2  Physical Exam: Chronically ill appearing 72 year old man, who looks older than his stated age, but in NAD HEENT: Unremarkable Neck:  7 cm JVD, no thyromegally Back:  No CVA tenderness Lungs:  Clear except for basilar rales. Lateral aspect of his pacemaker incision has very erythematous skin and very thin skin. There is pending a revision of the device pocket. No surrounding cellulitis. No pocket of fluctuance. HEART:  Regular rate rhythm, no murmurs, no rubs, no clicks Abd:  soft, positive bowel sounds, no organomegally, no rebound, no guarding Ext:  2 plus pulses, no edema, no cyanosis, no clubbing Skin:  No rashes no nodules Neuro:  CN II through XII intact, motor grossly intact  EKG - atrial fibrillation with a controlled ventricular spots  DEVICE  Normal device function.  See PaceArt for details.   Assess/Plan:

## 2013-01-07 ENCOUNTER — Other Ambulatory Visit: Payer: Self-pay

## 2013-01-07 LAB — VANCOMYCIN, TROUGH: Vancomycin, Trough: 13 ug/mL (ref 10–20)

## 2013-01-14 ENCOUNTER — Other Ambulatory Visit: Payer: Self-pay | Admitting: Nephrology

## 2013-01-14 LAB — VANCOMYCIN, TROUGH: Vancomycin, Trough: 20 ug/mL (ref 10–20)

## 2013-01-20 ENCOUNTER — Encounter: Payer: Medicare Other | Admitting: Internal Medicine

## 2013-01-21 ENCOUNTER — Other Ambulatory Visit: Payer: Self-pay | Admitting: Nephrology

## 2013-01-21 LAB — VANCOMYCIN, TROUGH: Vancomycin, Trough: 18 ug/mL (ref 10–20)

## 2013-02-02 ENCOUNTER — Encounter: Payer: Self-pay | Admitting: Internal Medicine

## 2013-02-02 ENCOUNTER — Ambulatory Visit (INDEPENDENT_AMBULATORY_CARE_PROVIDER_SITE_OTHER): Payer: Medicare Other | Admitting: Internal Medicine

## 2013-02-02 VITALS — BP 117/42 | HR 75 | Ht 72.0 in | Wt 233.0 lb

## 2013-02-02 DIAGNOSIS — Z5189 Encounter for other specified aftercare: Secondary | ICD-10-CM

## 2013-02-02 DIAGNOSIS — N186 End stage renal disease: Secondary | ICD-10-CM

## 2013-02-02 DIAGNOSIS — T827XXD Infection and inflammatory reaction due to other cardiac and vascular devices, implants and grafts, subsequent encounter: Secondary | ICD-10-CM

## 2013-02-02 DIAGNOSIS — Z950 Presence of cardiac pacemaker: Secondary | ICD-10-CM

## 2013-02-02 DIAGNOSIS — I2510 Atherosclerotic heart disease of native coronary artery without angina pectoris: Secondary | ICD-10-CM

## 2013-02-02 NOTE — Patient Instructions (Addendum)
Device extraction on Mon 02/07/13  Go to [employer] the day of procedure.  Be there at  DO NOT EAT/DRINK after midnight the night prior to your procedure

## 2013-02-02 NOTE — Progress Notes (Signed)
HPI Robert [name] returns today for followup. He is a very pleasant 72-year-old man with an ischemic cardiomyopathy, chronic systolic heart failure, ejection fraction 40%, atrial flutter, status post catheter ablation, and symptomatic bradycardia, in the setting of atrial fibrillation. The patient underwent pacemaker generator change out several months ago, and has subsequently developed thinning of the skin on the lateral margin of his incision. He denies fevers or chills. There has been no drainage from the site. His skin was thin when he saw Robert [name] a month ago.  At that time, they decided to defer system extraction until the device eroded through the skin.  He now presents with device erosion.  He denies fevers, chills, or other concerns.   Allergies  Allergen Reactions  . Ciprofloxacin   . Morphine   . Niacin      Current Outpatient Prescriptions  Medication Sig Dispense Refill  . albuterol (PROVENTIL HFA) 108 (90 BASE) MCG/ACT inhaler Inhale 2 puffs into the lungs every 6 (six) hours as needed. UAD       . amoxicillin-clavulanate (AUGMENTIN) 500-125 MG per tablet Take 1 tablet by mouth daily.       . aspirin 81 MG EC tablet Take 162 mg by mouth daily.        . b complex vitamins tablet Take 1 tablet by mouth daily.        . carisoprodol (SOMA) 350 MG tablet Take 350 mg by mouth every 8 (eight) hours as needed.      . Cholecalciferol 1000 UNITS capsule Take 2,000 Units by mouth daily.       . Coenzyme Q10 (COQ10) 100 MG CAPS Take 1 capsule by mouth 2 (two) times daily.       . epoetin alfa (PROCRIT) 3000 UNIT/ML injection Inject 6,000 Units into the skin 2 (two) times a week.        . finasteride (PROSCAR) 5 MG tablet Take 5 mg by mouth daily.      . gabapentin (NEURONTIN) 100 MG capsule Take 100 mg by mouth 3 (three) times daily.       . HYDROcodone-acetaminophen (NORCO) 10-325 MG per tablet Take 1 tablet by mouth every 6 (six) hours as needed for pain.      . insulin aspart  (NOVOLOG FLEXPEN) 100 UNIT/ML injection Inject 4 Units into the skin 3 (three) times daily as needed for high blood sugar.      . levothyroxine (SYNTHROID, LEVOTHROID) 150 MCG tablet Take 150 mcg by mouth daily.        . NITROSTAT 0.4 MG SL tablet Place 0.4 mg under the tongue every 5 (five) minutes as needed.       . omeprazole (PRILOSEC) 40 MG capsule Take 40 mg by mouth daily.        . Probiotic Product (PROBIOTIC FORMULA PO) Take by mouth.        . ropinirole (REQUIP) 5 MG tablet Take 5 mg by mouth 3 (three) times daily.      . vancomycin (VANCOCIN) 125 MG capsule Take 125 mg by mouth 4 (four) times daily.       No current facility-administered medications for this visit.     Past Medical History  Diagnosis Date  . CAD (coronary artery disease)     EF 40% 2011 CABG 2004  . PVD (peripheral vascular disease)   . Carotid artery disease   . Sinus node dysfunction     noted following Afluttter RFCA  . Atrial fibrillation       S/P AFlutter RFCA 2006  . Pacemaker     2006  . ESRD (end stage renal disease)     hemodialysis  . Diverticulitis   He reports having a "bleeding disorder" which was determined at the time of his bypss in 2004.  He does not know what it is called however.   ROS:   All systems reviewed and negative except as noted in the HPI.   Past Surgical History  Procedure Laterality Date  . Tonsillectomy    . Bladder surgery    . Pacemaker insertion  1/24/6    Medtronic N-rhythm [id number]  . Coronary artery bypass graft    . Insert / replace / remove pacemaker      Medtronic  . Femoral endarterectomy      left lower  . Toe amputation       No family history on file.   History   Social History  . Marital Status: Married    Spouse Name: N/A    Number of Children: N/A  . Years of Education: N/A   Occupational History  . Not on file.   Social History Main Topics  . Smoking status: Former Smoker  . Smokeless tobacco: Not on file     Comment: quit in  2003  . Alcohol Use: No  . Drug Use: No  . Sexual Activity: Not on file   Other Topics Concern  . Not on file   Social History Narrative   Retired, married, gets regular exercise.      BP 117/42  Pulse 75  Ht 6' (1.829 m)  Wt 233 lb (105.688 kg)  BMI 31.59 kg/m2  Physical Exam: Chronically ill appearing 72-year-old man, who looks older than his stated age, but in NAD HEENT: OP clear Neck:  7 cm JVD, no thyromegally Back:  No CVA tenderness Lungs:  Clear except for basilar rales.  HEART:  Regular rate rhythm, no murmurs, no rubs, no clicks Abd:  soft, positive bowel sounds, no organomegally, no rebound, no guarding Ext:  2 plus pulses, no edema, no cyanosis, no clubbing Skin:  No rashes no nodules, L sided pacemaker has eroded through the skin with a 0.8x0.8 hole in the tissue.  No drainage or discharge Neuro:  CN II through XII intact, motor grossly intact  Assess/Plan:  1. Pacemaker infection The patient presents with erosion of his device.  I have reviewed Robert Taylors note from 8/14 and also discussed with Robert [name] by phone today.  At this time, the plan is to return for elective system replacement when Robert [name] returns early next week.  We will arrange for this with anesthesia and surgical backup.  He is instructed if he has fevers, chills, drainage, or other concerns that he should report to [place] ER right away.  He is presently on Vancomycin with dialysis.  2. ESRD Will need to have dialysis arranged around his extraction  3. Concerns for bladder CA He was supposed to have surgery next week.  This will need to be deferred until after his device has been extracted  4. Chronic osteomyelitis Very high risk for recurrent infections  5. "bleeding disorder" I have instructed the patient to find out more information about his history prior to the procedure next week  

## 2013-02-03 ENCOUNTER — Telehealth: Payer: Self-pay | Admitting: Internal Medicine

## 2013-02-03 NOTE — Telephone Encounter (Signed)
Called patient back  It is looking like Thurs per CV[facility].  Patient aware

## 2013-02-03 NOTE — Telephone Encounter (Signed)
Follow Up  Pt calling back to give additional information// pt did not go into detail.

## 2013-02-07 ENCOUNTER — Encounter (HOSPITAL_COMMUNITY): Payer: Self-pay | Admitting: Pharmacy Technician

## 2013-02-09 ENCOUNTER — Encounter (HOSPITAL_COMMUNITY): Payer: Self-pay | Admitting: *Deleted

## 2013-02-09 NOTE — Progress Notes (Signed)
DR.Klein is cardiologist-next visit on Sept 17  Multiple echos in epic with most recently 2014  Stress test in epic from 2013  EKG in epic from 12-09-12  CXR in epic from 07-25-12  Medical MD [email or web address] in [place]

## 2013-02-10 ENCOUNTER — Encounter (HOSPITAL_COMMUNITY): Payer: Self-pay | Admitting: Anesthesiology

## 2013-02-10 ENCOUNTER — Encounter (HOSPITAL_COMMUNITY): Payer: Self-pay | Admitting: *Deleted

## 2013-02-10 ENCOUNTER — Inpatient Hospital Stay (HOSPITAL_COMMUNITY): Payer: Medicare Other | Admitting: Anesthesiology

## 2013-02-10 ENCOUNTER — Inpatient Hospital Stay (HOSPITAL_COMMUNITY)
Admission: RE | Admit: 2013-02-10 | Discharge: 2013-02-12 | DRG: 260 | Disposition: A | Discharge status: Home / Self Care | Payer: Medicare Other | Source: Ambulatory Visit | Attending: Internal Medicine | Admitting: Internal Medicine

## 2013-02-10 ENCOUNTER — Encounter (HOSPITAL_COMMUNITY)
Admission: RE | Disposition: A | Discharge status: Home / Self Care | Payer: Self-pay | Source: Ambulatory Visit | Attending: Internal Medicine

## 2013-02-10 DIAGNOSIS — F329 Major depressive disorder, single episode, unspecified: Secondary | ICD-10-CM | POA: Diagnosis present

## 2013-02-10 DIAGNOSIS — T827XXA Infection and inflammatory reaction due to other cardiac and vascular devices, implants and grafts, initial encounter: Secondary | ICD-10-CM

## 2013-02-10 DIAGNOSIS — G4700 Insomnia, unspecified: Secondary | ICD-10-CM | POA: Diagnosis present

## 2013-02-10 DIAGNOSIS — I509 Heart failure, unspecified: Secondary | ICD-10-CM

## 2013-02-10 DIAGNOSIS — I2510 Atherosclerotic heart disease of native coronary artery without angina pectoris: Secondary | ICD-10-CM

## 2013-02-10 DIAGNOSIS — I739 Peripheral vascular disease, unspecified: Secondary | ICD-10-CM

## 2013-02-10 DIAGNOSIS — I2589 Other forms of chronic ischemic heart disease: Secondary | ICD-10-CM | POA: Diagnosis present

## 2013-02-10 DIAGNOSIS — Z87891 Personal history of nicotine dependence: Secondary | ICD-10-CM

## 2013-02-10 DIAGNOSIS — F3289 Other specified depressive episodes: Secondary | ICD-10-CM | POA: Diagnosis present

## 2013-02-10 DIAGNOSIS — N186 End stage renal disease: Secondary | ICD-10-CM

## 2013-02-10 DIAGNOSIS — I4891 Unspecified atrial fibrillation: Secondary | ICD-10-CM

## 2013-02-10 DIAGNOSIS — E119 Type 2 diabetes mellitus without complications: Secondary | ICD-10-CM | POA: Diagnosis present

## 2013-02-10 DIAGNOSIS — I498 Other specified cardiac arrhythmias: Secondary | ICD-10-CM

## 2013-02-10 DIAGNOSIS — Y831 Surgical operation with implant of artificial internal device as the cause of abnormal reaction of the patient, or of later complication, without mention of misadventure at the time of the procedure: Secondary | ICD-10-CM | POA: Diagnosis present

## 2013-02-10 DIAGNOSIS — Z992 Dependence on renal dialysis: Secondary | ICD-10-CM

## 2013-02-10 DIAGNOSIS — Z950 Presence of cardiac pacemaker: Secondary | ICD-10-CM

## 2013-02-10 DIAGNOSIS — I4892 Unspecified atrial flutter: Secondary | ICD-10-CM | POA: Diagnosis present

## 2013-02-10 DIAGNOSIS — I5022 Chronic systolic (congestive) heart failure: Secondary | ICD-10-CM | POA: Diagnosis present

## 2013-02-10 DIAGNOSIS — I951 Orthostatic hypotension: Secondary | ICD-10-CM

## 2013-02-10 DIAGNOSIS — R0602 Shortness of breath: Secondary | ICD-10-CM

## 2013-02-10 DIAGNOSIS — M8660 Other chronic osteomyelitis, unspecified site: Secondary | ICD-10-CM | POA: Diagnosis present

## 2013-02-10 DIAGNOSIS — Z951 Presence of aortocoronary bypass graft: Secondary | ICD-10-CM

## 2013-02-10 HISTORY — DX: Other skin changes: R23.8

## 2013-02-10 HISTORY — DX: Gastro-esophageal reflux disease without esophagitis: K21.9

## 2013-02-10 HISTORY — PX: PACEMAKER LEAD REMOVAL: SHX5064

## 2013-02-10 HISTORY — DX: Personal history of colon polyps, unspecified: Z86.0100

## 2013-02-10 HISTORY — DX: Depression, unspecified: F32.A

## 2013-02-10 HISTORY — DX: Personal history of colonic polyps: Z86.010

## 2013-02-10 HISTORY — DX: Hypothyroidism, unspecified: E03.9

## 2013-02-10 HISTORY — DX: Parkinson's disease without dyskinesia, without mention of fluctuations: G20.A1

## 2013-02-10 HISTORY — DX: Type 2 diabetes mellitus without complications: E11.9

## 2013-02-10 HISTORY — DX: Unspecified osteoarthritis, unspecified site: M19.90

## 2013-02-10 HISTORY — DX: Personal history of other medical treatment: Z92.89

## 2013-02-10 HISTORY — DX: Personal history of other diseases of the respiratory system: Z87.09

## 2013-02-10 HISTORY — DX: Hypotension, unspecified: I95.9

## 2013-02-10 HISTORY — DX: Unspecified asthma, uncomplicated: J45.909

## 2013-02-10 HISTORY — DX: Spontaneous ecchymoses: R23.3

## 2013-02-10 HISTORY — DX: Other complications of anesthesia, initial encounter: T88.59XA

## 2013-02-10 HISTORY — DX: Major depressive disorder, single episode, unspecified: F32.9

## 2013-02-10 HISTORY — DX: Shortness of breath: R06.02

## 2013-02-10 HISTORY — DX: Pain in unspecified joint: M25.50

## 2013-02-10 HISTORY — DX: Personal history of traumatic brain injury: Z87.820

## 2013-02-10 HISTORY — DX: Other amnesia: R41.3

## 2013-02-10 HISTORY — DX: Disorientation, unspecified: R41.0

## 2013-02-10 HISTORY — DX: Pneumonia, unspecified organism: J18.9

## 2013-02-10 HISTORY — DX: Polyneuropathy, unspecified: G62.9

## 2013-02-10 HISTORY — DX: Personal history of other diseases of the digestive system: Z87.19

## 2013-02-10 HISTORY — DX: Parkinson's disease: G20

## 2013-02-10 HISTORY — DX: Effusion, unspecified joint: M25.40

## 2013-02-10 HISTORY — DX: Other chronic pain: G89.29

## 2013-02-10 HISTORY — DX: Dorsalgia, unspecified: M54.9

## 2013-02-10 HISTORY — DX: Insomnia, unspecified: G47.00

## 2013-02-10 HISTORY — DX: Adverse effect of unspecified anesthetic, initial encounter: T41.45XA

## 2013-02-10 HISTORY — DX: Changes in skin texture: R23.4

## 2013-02-10 HISTORY — DX: Unspecified cataract: H26.9

## 2013-02-10 HISTORY — DX: Anemia, unspecified: D64.9

## 2013-02-10 LAB — BASIC METABOLIC PANEL
BUN: 53 mg/dL — ABNORMAL HIGH (ref 6–23)
Calcium: 10 mg/dL (ref 8.4–10.5)
Creatinine, Ser: 6.29 mg/dL — ABNORMAL HIGH (ref 0.50–1.35)
GFR calc Af Amer: 9 mL/min — ABNORMAL LOW (ref >90)
GFR calc non Af Amer: 8 mL/min — ABNORMAL LOW (ref >90)
Glucose, Bld: 107 mg/dL — ABNORMAL HIGH (ref 70–99)

## 2013-02-10 LAB — CBC
Hemoglobin: 9 g/dL — ABNORMAL LOW (ref 13.0–17.0)
MCH: 26.9 pg (ref 26.0–34.0)
MCHC: 30.7 g/dL (ref 30.0–36.0)
Platelets: 88 10*3/uL — ABNORMAL LOW (ref 150–400)
RDW: 19.5 % — ABNORMAL HIGH (ref 11.5–15.5)

## 2013-02-10 LAB — GLUCOSE, CAPILLARY
Glucose-Capillary: 104 mg/dL — ABNORMAL HIGH (ref 70–99)
Glucose-Capillary: 93 mg/dL (ref 70–99)

## 2013-02-10 SURGERY — REMOVAL, ELECTRODE LEAD, CARDIAC PACEMAKER, WITHOUT REPLACEMENT
Anesthesia: General | Site: Chest | Laterality: Left | Wound class: Dirty or Infected

## 2013-02-10 MED ORDER — NEOSTIGMINE METHYLSULFATE 1 MG/ML IJ SOLN
INTRAMUSCULAR | Status: DC | PRN
  Administered 2013-02-10: 4 mg via INTRAVENOUS

## 2013-02-10 MED ORDER — FENTANYL CITRATE 0.05 MG/ML IJ SOLN: 25.0000 ug | INTRAMUSCULAR | Status: DC | PRN

## 2013-02-10 MED ORDER — CEFAZOLIN SODIUM-DEXTROSE 2-3 GM-% IV SOLR
2.0000 g | Freq: Once | INTRAVENOUS | Status: AC
  Administered 2013-02-10: 2 g via INTRAVENOUS
  Filled 2013-02-10 (×2): qty 50

## 2013-02-10 MED ORDER — NITROGLYCERIN 0.4 MG SL SUBL: 0.4000 mg | SUBLINGUAL_TABLET | SUBLINGUAL | Status: DC | PRN

## 2013-02-10 MED ORDER — ONDANSETRON HCL 4 MG/2ML IJ SOLN: 4.0000 mg | Freq: Four times a day (QID) | INTRAMUSCULAR | Status: DC | PRN

## 2013-02-10 MED ORDER — ROCURONIUM BROMIDE 100 MG/10ML IV SOLN
INTRAVENOUS | Status: DC | PRN
  Administered 2013-02-10: 30 mg via INTRAVENOUS

## 2013-02-10 MED ORDER — GLYCOPYRROLATE 0.2 MG/ML IJ SOLN
INTRAMUSCULAR | Status: DC | PRN
  Administered 2013-02-10: .6 mg via INTRAVENOUS

## 2013-02-10 MED ORDER — DEXTROSE 5 % IV SOLN
1.5000 g | Freq: Four times a day (QID) | INTRAVENOUS | Status: AC
  Administered 2013-02-10 – 2013-02-11 (×3): 1.5 g via INTRAVENOUS
  Filled 2013-02-10 (×4): qty 15

## 2013-02-10 MED ORDER — GABAPENTIN 100 MG PO CAPS
100.0000 mg | ORAL_CAPSULE | Freq: Three times a day (TID) | ORAL | Status: DC
  Administered 2013-02-10 – 2013-02-12 (×4): 100 mg via ORAL
  Filled 2013-02-10 (×7): qty 1

## 2013-02-10 MED ORDER — OXYCODONE HCL 5 MG PO TABS: 5.0000 mg | ORAL_TABLET | Freq: Once | ORAL | Status: DC | PRN

## 2013-02-10 MED ORDER — SODIUM CHLORIDE 0.9 % IR SOLN
Status: DC | PRN
  Administered 2013-02-10: 15:00:00

## 2013-02-10 MED ORDER — LEVOTHYROXINE SODIUM 175 MCG PO TABS
175.0000 ug | ORAL_TABLET | Freq: Every day | ORAL | Status: DC
  Administered 2013-02-11 – 2013-02-12 (×2): 175 ug via ORAL
  Filled 2013-02-10 (×3): qty 1

## 2013-02-10 MED ORDER — INSULIN ASPART 100 UNIT/ML ~~LOC~~ SOLN: 4.0000 [IU] | Freq: Three times a day (TID) | SUBCUTANEOUS | Status: DC | PRN

## 2013-02-10 MED ORDER — LORATADINE 10 MG PO TABS
10.0000 mg | ORAL_TABLET | Freq: Every day | ORAL | Status: DC
  Administered 2013-02-10 – 2013-02-11 (×2): 10 mg via ORAL
  Filled 2013-02-10 (×3): qty 1

## 2013-02-10 MED ORDER — ONDANSETRON HCL 4 MG/2ML IJ SOLN
INTRAMUSCULAR | Status: DC | PRN
  Administered 2013-02-10: 4 mg via INTRAVENOUS

## 2013-02-10 MED ORDER — ALBUTEROL SULFATE HFA 108 (90 BASE) MCG/ACT IN AERS
2.0000 | INHALATION_SPRAY | Freq: Four times a day (QID) | RESPIRATORY_TRACT | Status: DC | PRN
  Filled 2013-02-10: qty 6.7

## 2013-02-10 MED ORDER — LEVOCETIRIZINE DIHYDROCHLORIDE 5 MG PO TABS: 5.0000 mg | ORAL_TABLET | Freq: Every evening | ORAL | Status: DC

## 2013-02-10 MED ORDER — FINASTERIDE 5 MG PO TABS
5.0000 mg | ORAL_TABLET | Freq: Every day | ORAL | Status: DC
Start: 2013-02-10 — End: 2013-02-12
  Administered 2013-02-10 – 2013-02-12 (×3): 5 mg via ORAL
  Filled 2013-02-10 (×3): qty 1

## 2013-02-10 MED ORDER — MIDODRINE HCL 5 MG PO TABS
5.0000 mg | ORAL_TABLET | Freq: Three times a day (TID) | ORAL | Status: DC
  Administered 2013-02-11 – 2013-02-12 (×3): 5 mg via ORAL
  Filled 2013-02-10 (×7): qty 1

## 2013-02-10 MED ORDER — ACETAMINOPHEN 325 MG PO TABS: 325.0000 mg | ORAL_TABLET | ORAL | Status: DC | PRN

## 2013-02-10 MED ORDER — SUCCINYLCHOLINE CHLORIDE 20 MG/ML IJ SOLN
INTRAMUSCULAR | Status: DC | PRN
  Administered 2013-02-10: 100 mg via INTRAVENOUS

## 2013-02-10 MED ORDER — LIDOCAINE HCL (PF) 1 % IJ SOLN
INTRAMUSCULAR | Status: DC | PRN
  Administered 2013-02-10: 30 mL

## 2013-02-10 MED ORDER — LIDOCAINE HCL (CARDIAC) 20 MG/ML IV SOLN
INTRAVENOUS | Status: DC | PRN
  Administered 2013-02-10: 80 mg via INTRAVENOUS

## 2013-02-10 MED ORDER — OXYCODONE HCL 5 MG/5ML PO SOLN: 5.0000 mg | Freq: Once | ORAL | Status: DC | PRN

## 2013-02-10 MED ORDER — SODIUM CHLORIDE 0.9 % IV SOLN
INTRAVENOUS | Status: DC
  Administered 2013-02-10: 35 mL/h via INTRAVENOUS
  Administered 2013-02-10: 16:00:00 via INTRAVENOUS

## 2013-02-10 MED ORDER — PHENYLEPHRINE HCL 10 MG/ML IJ SOLN
10.0000 mg | INTRAVENOUS | Status: DC | PRN
  Administered 2013-02-10: 10 ug/min via INTRAVENOUS

## 2013-02-10 MED ORDER — FENTANYL CITRATE 0.05 MG/ML IJ SOLN
INTRAMUSCULAR | Status: DC | PRN
  Administered 2013-02-10: 100 ug via INTRAVENOUS

## 2013-02-10 MED ORDER — SODIUM CHLORIDE 0.9 % IR SOLN
Status: DC
  Filled 2013-02-10: qty 2

## 2013-02-10 MED ORDER — PROMETHAZINE HCL 25 MG/ML IJ SOLN: 6.2500 mg | INTRAMUSCULAR | Status: DC | PRN

## 2013-02-10 MED ORDER — PROPOFOL 10 MG/ML IV BOLUS
INTRAVENOUS | Status: DC | PRN
  Administered 2013-02-10: 180 mg via INTRAVENOUS

## 2013-02-10 MED ORDER — HYDROCODONE-ACETAMINOPHEN 10-325 MG PO TABS
1.0000 | ORAL_TABLET | Freq: Three times a day (TID) | ORAL | Status: DC | PRN
  Administered 2013-02-11 (×2): 1 via ORAL
  Filled 2013-02-10 (×2): qty 1

## 2013-02-10 MED ORDER — ROPINIROLE HCL 1 MG PO TABS
5.0000 mg | ORAL_TABLET | Freq: Three times a day (TID) | ORAL | Status: DC
  Administered 2013-02-10 – 2013-02-12 (×5): 5 mg via ORAL
  Filled 2013-02-10 (×8): qty 5

## 2013-02-10 MED ORDER — SODIUM CHLORIDE 0.9 % IR SOLN
Status: DC | PRN
  Administered 2013-02-10: 16:00:00

## 2013-02-10 MED ORDER — BENZONATATE 100 MG PO CAPS
100.0000 mg | ORAL_CAPSULE | Freq: Three times a day (TID) | ORAL | Status: DC | PRN
  Filled 2013-02-10: qty 1

## 2013-02-10 MED ORDER — LIDOCAINE HCL (PF) 1 % IJ SOLN
INTRAMUSCULAR | Status: AC
  Filled 2013-02-10: qty 30

## 2013-02-10 MED ORDER — PANTOPRAZOLE SODIUM 40 MG PO TBEC
40.0000 mg | DELAYED_RELEASE_TABLET | Freq: Every day | ORAL | Status: DC
  Administered 2013-02-10 – 2013-02-12 (×3): 40 mg via ORAL
  Filled 2013-02-10 (×3): qty 1

## 2013-02-10 MED ORDER — BIOTENE DRY MOUTH MT LIQD
15.0000 mL | Freq: Two times a day (BID) | OROMUCOSAL | Status: DC
  Administered 2013-02-11 – 2013-02-12 (×3): 15 mL via OROMUCOSAL

## 2013-02-10 SURGICAL SUPPLY — 35 items
BAG BANDED W/RUBBER/TAPE 36X54 (MISCELLANEOUS) ×2 IMPLANT
BAG EQP BAND 135X91 W/RBR TAPE (MISCELLANEOUS) ×1
BLADE STERNUM SYSTEM 6 (BLADE) ×2 IMPLANT
BNDG ADH 5X4 AIR PERM ELC (GAUZE/BANDAGES/DRESSINGS)
BNDG COHESIVE 4X5 WHT NS (GAUZE/BANDAGES/DRESSINGS) IMPLANT
CANISTER SUCTION 2500CC (MISCELLANEOUS) ×2 IMPLANT
CLOTH BEACON ORANGE TIMEOUT ST (SAFETY) ×1 IMPLANT
COVER TABLE BACK 60X90 (DRAPES) ×2 IMPLANT
DRAPE CARDIOVASCULAR INCISE (DRAPES) ×2
DRAPE SRG 135X102X78XABS (DRAPES) ×1 IMPLANT
DRSG OPSITE 6X11 MED (GAUZE/BANDAGES/DRESSINGS) IMPLANT
ELECT REM PT RETURN 9FT ADLT (ELECTROSURGICAL) ×4
ELECTRODE REM PT RTRN 9FT ADLT (ELECTROSURGICAL) ×2 IMPLANT
GAUZE PACKING IODOFORM 1/2 (PACKING) ×1 IMPLANT
GAUZE SPONGE 4X4 16PLY XRAY LF (GAUZE/BANDAGES/DRESSINGS) IMPLANT
GLOVE BIOGEL PI IND STRL 6.5 (GLOVE) IMPLANT
GLOVE BIOGEL PI IND STRL 7.5 (GLOVE) ×1 IMPLANT
GLOVE BIOGEL PI INDICATOR 6.5 (GLOVE) ×3
GLOVE BIOGEL PI INDICATOR 7.5 (GLOVE) ×1
GLOVE ECLIPSE 6.5 STRL STRAW (GLOVE) ×2 IMPLANT
GLOVE ECLIPSE 8.0 STRL XLNG CF (GLOVE) ×2 IMPLANT
GOWN PREVENTION PLUS XLARGE (GOWN DISPOSABLE) ×2 IMPLANT
GOWN STRL NON-REIN LRG LVL3 (GOWN DISPOSABLE) ×1 IMPLANT
KIT ROOM TURNOVER OR (KITS) ×2 IMPLANT
PAD ARMBOARD 7.5X6 YLW CONV (MISCELLANEOUS) ×4 IMPLANT
PAD ELECT DEFIB RADIOL ZOLL (MISCELLANEOUS) ×2 IMPLANT
SPONGE GAUZE 4X4 12PLY (GAUZE/BANDAGES/DRESSINGS) ×1 IMPLANT
SUT PROLENE 2 0 SH DA (SUTURE) ×1 IMPLANT
SYR 20CC LL (SYRINGE) ×1 IMPLANT
TAPE CLOTH SURG 4X10 WHT LF (GAUZE/BANDAGES/DRESSINGS) ×1 IMPLANT
TOWEL OR 17X24 6PK STRL BLUE (TOWEL DISPOSABLE) ×4 IMPLANT
TOWEL OR 17X26 10 PK STRL BLUE (TOWEL DISPOSABLE) ×5 IMPLANT
TRAY FOLEY IC TEMP SENS 14FR (CATHETERS) ×2 IMPLANT
TUBE CONNECTING 12X1/4 (SUCTIONS) IMPLANT
YANKAUER SUCT BULB TIP NO VENT (SUCTIONS) IMPLANT

## 2013-02-10 NOTE — Preoperative (Signed)
Beta Blockers   Reason not to administer Beta Blockers:Not Applicable 

## 2013-02-10 NOTE — Interval H&P Note (Signed)
History and Physical Interval Note:  02/10/2013 2:04 PM  Robert [name]  has presented today for surgery, with the diagnosis of PPM infection device erosion  The various methods of treatment have been discussed with the patient and family. After consideration of risks, benefits and other options for treatment, the patient has consented to  Procedure(s): PACEMAKER LEAD REMOVAL (Left) as a surgical intervention .  The patient's history has been reviewed, patient examined, no change in status, stable for surgery.  I have reviewed the patient's chart and labs.  Questions were answered to the patient's satisfaction.     [name].D.

## 2013-02-10 NOTE — Anesthesia Procedure Notes (Signed)
Procedure Name: Intubation Date/Time: 02/10/2013 3:25 PM Performed by: Angelica Pou Pre-anesthesia Checklist: Patient identified, Emergency Drugs available, Suction available, Patient being monitored and Timeout performed Patient Re-evaluated:Patient Re-evaluated prior to inductionOxygen Delivery Method: Circle system utilized Preoxygenation: Pre-oxygenation with 100% oxygen Intubation Type: IV induction Ventilation: Mask ventilation without difficulty Grade View: Grade I Tube type: Oral Tube size: 7.5 mm Number of attempts: 1 Airway Equipment and Method: Video-laryngoscopy and Rigid stylet Placement Confirmation: ETT inserted through vocal cords under direct vision,  positive ETCO2 and breath sounds checked- equal and bilateral Secured at: 22 cm Tube secured with: Tape Dental Injury: Teeth and Oropharynx as per pre-operative assessment  Comments: Glidescope used d/t cervical LROM.

## 2013-02-10 NOTE — Anesthesia Postprocedure Evaluation (Signed)
  Anesthesia Post-op Note  Patient: Robert [name]  Procedure(s) Performed: Procedure(s): PACEMAKER LEAD REMOVAL (Left)  Patient Location: PACU  Anesthesia Type:General  Level of Consciousness: awake, alert  and oriented  Airway and Oxygen Therapy: Patient Spontanous Breathing and Patient connected to nasal cannula oxygen  Post-op Pain: none  Post-op Assessment: Post-op Vital signs reviewed  Post-op Vital Signs: Reviewed  Complications: No apparent anesthesia complications

## 2013-02-10 NOTE — Anesthesia Preprocedure Evaluation (Addendum)
Anesthesia Evaluation  Patient identified by MRN, date of birth, ID band Patient awake    Airway Mallampati: II  Neck ROM: Limited    Dental   Pulmonary shortness of breath, asthma , COPD breath sounds clear to auscultation        Cardiovascular + CAD, + Peripheral Vascular Disease and +CHF + dysrhythmias + pacemaker Rhythm:Irregular Rate:Normal     Neuro/Psych    GI/Hepatic hiatal hernia, GERD-  ,  Endo/Other  diabetesHypothyroidism   Renal/GU      Musculoskeletal   Abdominal   Peds  Hematology   Anesthesia Other Findings   Reproductive/Obstetrics                          Anesthesia Physical Anesthesia Plan  ASA: III  Anesthesia Plan: General   Post-op Pain Management:    Induction: Intravenous  Airway Management Planned: Oral ETT  Additional Equipment: Arterial line  Intra-op Plan:   Post-operative Plan: Extubation in OR  Informed Consent:   Plan Discussed with: CRNA and Surgeon  Anesthesia Plan Comments:         Anesthesia Quick Evaluation

## 2013-02-10 NOTE — CV Procedure (Signed)
PPM system extraction performed without immediate complication. W#[id number].

## 2013-02-10 NOTE — H&P (View-Only) (Signed)
HPI [name] returns today for followup. He is a very pleasant 72 year old man with an ischemic cardiomyopathy, chronic systolic heart failure, ejection fraction 40%, atrial flutter, status post catheter ablation, and symptomatic bradycardia, in the setting of atrial fibrillation. The patient underwent pacemaker generator change out several months ago, and has subsequently developed thinning of the skin on the lateral margin of his incision. He denies fevers or chills. There has been no drainage from the site. His skin was thin when he saw Dr Ladona Ridgel a month ago.  At that time, they decided to defer system extraction until the device eroded through the skin.  He now presents with device erosion.  He denies fevers, chills, or other concerns.   Allergies  Allergen Reactions  . Ciprofloxacin   . Morphine   . Niacin      Current Outpatient Prescriptions  Medication Sig Dispense Refill  . albuterol (PROVENTIL HFA) 108 (90 BASE) MCG/ACT inhaler Inhale 2 puffs into the lungs every 6 (six) hours as needed. UAD       . amoxicillin-clavulanate (AUGMENTIN) 500-125 MG per tablet Take 1 tablet by mouth daily.       Marland Kitchen aspirin 81 MG EC tablet Take 162 mg by mouth daily.        Marland Kitchen b complex vitamins tablet Take 1 tablet by mouth daily.        . carisoprodol (SOMA) 350 MG tablet Take 350 mg by mouth every 8 (eight) hours as needed.      . Cholecalciferol 1000 UNITS capsule Take 2,000 Units by mouth daily.       . Coenzyme Q10 (COQ10) 100 MG CAPS Take 1 capsule by mouth 2 (two) times daily.       Marland Kitchen epoetin alfa (PROCRIT) 3000 UNIT/ML injection Inject 6,000 Units into the skin 2 (two) times a week.        . finasteride (PROSCAR) 5 MG tablet Take 5 mg by mouth daily.      Marland Kitchen gabapentin (NEURONTIN) 100 MG capsule Take 100 mg by mouth 3 (three) times daily.       Marland Kitchen HYDROcodone-acetaminophen (NORCO) 10-325 MG per tablet Take 1 tablet by mouth every 6 (six) hours as needed for pain.      Marland Kitchen insulin aspart  (NOVOLOG FLEXPEN) 100 UNIT/ML injection Inject 4 Units into the skin 3 (three) times daily as needed for high blood sugar.      . levothyroxine (SYNTHROID, LEVOTHROID) 150 MCG tablet Take 150 mcg by mouth daily.        Marland Kitchen NITROSTAT 0.4 MG SL tablet Place 0.4 mg under the tongue every 5 (five) minutes as needed.       Marland Kitchen omeprazole (PRILOSEC) 40 MG capsule Take 40 mg by mouth daily.        . Probiotic Product (PROBIOTIC FORMULA PO) Take by mouth.        . ropinirole (REQUIP) 5 MG tablet Take 5 mg by mouth 3 (three) times daily.      . vancomycin (VANCOCIN) 125 MG capsule Take 125 mg by mouth 4 (four) times daily.       No current facility-administered medications for this visit.     Past Medical History  Diagnosis Date  . CAD (coronary artery disease)     EF 40% 2011 CABG 2004  . PVD (peripheral vascular disease)   . Carotid artery disease   . Sinus node dysfunction     noted following Afluttter RFCA  . Atrial fibrillation  S/P AFlutter RFCA 2006  . Pacemaker     2006  . ESRD (end stage renal disease)     hemodialysis  . Diverticulitis   He reports having a "bleeding disorder" which was determined at the time of his bypss in 2004.  He does not know what it is called however.   ROS:   All systems reviewed and negative except as noted in the HPI.   Past Surgical History  Procedure Laterality Date  . Tonsillectomy    . Bladder surgery    . Pacemaker insertion  1/24/6    Medtronic N-rhythm P1501DR  . Coronary artery bypass graft    . Insert / replace / remove pacemaker      Medtronic  . Femoral endarterectomy      left lower  . Toe amputation       No family history on file.   History   Social History  . Marital Status: Married    Spouse Name: N/A    Number of Children: N/A  . Years of Education: N/A   Occupational History  . Not on file.   Social History Main Topics  . Smoking status: Former Games developer  . Smokeless tobacco: Not on file     Comment: quit in  2003  . Alcohol Use: No  . Drug Use: No  . Sexual Activity: Not on file   Other Topics Concern  . Not on file   Social History Narrative   Retired, married, gets regular exercise.      BP 117/42  Pulse 75  Ht 6' (1.829 m)  Wt 233 lb (105.688 kg)  BMI 31.59 kg/m2  Physical Exam: Chronically ill appearing 72 year old man, who looks older than his stated age, but in NAD HEENT: OP clear Neck:  7 cm JVD, no thyromegally Back:  No CVA tenderness Lungs:  Clear except for basilar rales.  HEART:  Regular rate rhythm, no murmurs, no rubs, no clicks Abd:  soft, positive bowel sounds, no organomegally, no rebound, no guarding Ext:  2 plus pulses, no edema, no cyanosis, no clubbing Skin:  No rashes no nodules, L sided pacemaker has eroded through the skin with a 0.8x0.8 hole in the tissue.  No drainage or discharge Neuro:  CN II through XII intact, motor grossly intact  Assess/Plan:  1. Pacemaker infection The patient presents with erosion of his device.  I have reviewed Dr Bruna Potter note from 8/14 and also discussed with Dr Ladona Ridgel by phone today.  At this time, the plan is to return for elective system replacement when Dr Ladona Ridgel returns early next week.  We will arrange for this with anesthesia and surgical backup.  He is instructed if he has fevers, chills, drainage, or other concerns that he should report to [facility] ER right away.  He is presently on Vancomycin with dialysis.  2. ESRD Will need to have dialysis arranged around his extraction  3. Concerns for bladder CA He was supposed to have surgery next week.  This will need to be deferred until after his device has been extracted  4. Chronic osteomyelitis Very high risk for recurrent infections  5. "bleeding disorder" I have instructed the patient to find out more information about his history prior to the procedure next week

## 2013-02-10 NOTE — Transfer of Care (Signed)
Immediate Anesthesia Transfer of Care Note  Patient: Robert [name]  Procedure(s) Performed: Procedure(s): PACEMAKER LEAD REMOVAL (Left)  Patient Location: PACU  Anesthesia Type:General  Level of Consciousness: awake, oriented, sedated and patient cooperative  Airway & Oxygen Therapy: Patient Spontanous Breathing and Patient connected to nasal cannula oxygen  Post-op Assessment: Report given to PACU RN, Post -op Vital signs reviewed and stable and Patient moving all extremities X 4  Post vital signs: Reviewed and stable  Complications: No apparent anesthesia complications

## 2013-02-11 ENCOUNTER — Inpatient Hospital Stay (HOSPITAL_COMMUNITY): Payer: Medicare Other

## 2013-02-11 ENCOUNTER — Encounter (HOSPITAL_COMMUNITY): Payer: Self-pay | Admitting: Internal Medicine

## 2013-02-11 DIAGNOSIS — N189 Chronic kidney disease, unspecified: Secondary | ICD-10-CM

## 2013-02-11 DIAGNOSIS — I4891 Unspecified atrial fibrillation: Secondary | ICD-10-CM

## 2013-02-11 LAB — GLUCOSE, CAPILLARY
Glucose-Capillary: 171 mg/dL — ABNORMAL HIGH (ref 70–99)
Glucose-Capillary: 113 mg/dL — ABNORMAL HIGH (ref 70–99)

## 2013-02-11 MED ORDER — SODIUM CHLORIDE 0.9 % IV SOLN: 100.0000 mL | INTRAVENOUS | Status: DC | PRN

## 2013-02-11 MED ORDER — HEPARIN SODIUM (PORCINE) 1000 UNIT/ML DIALYSIS: 1000.0000 [IU] | INTRAMUSCULAR | Status: DC | PRN

## 2013-02-11 MED ORDER — LIDOCAINE HCL (PF) 1 % IJ SOLN: 5.0000 mL | INTRAMUSCULAR | Status: DC | PRN

## 2013-02-11 MED ORDER — LIDOCAINE-PRILOCAINE 2.5-2.5 % EX CREA: 1.0000 "application " | TOPICAL_CREAM | CUTANEOUS | Status: DC | PRN

## 2013-02-11 MED ORDER — PENTAFLUOROPROP-TETRAFLUOROETH EX AERO: 1.0000 "application " | INHALATION_SPRAY | CUTANEOUS | Status: DC | PRN

## 2013-02-11 MED ORDER — ALTEPLASE 2 MG IJ SOLR: 2.0000 mg | Freq: Once | INTRAMUSCULAR | Status: DC | PRN

## 2013-02-11 NOTE — Progress Notes (Signed)
Patient ID: Robert [name], male   DOB: 1941/02/06, 72 y.o.   MRN: [id number] Subjective:  " I feel great"  Objective:  Vital Signs in the last 24 hours: Temp:  [97 F (36.1 C)-98 F (36.7 C)] 97.7 F (36.5 C) (09/12 0600) Pulse Rate:  [74-104] 74 (09/12 0600) Resp:  [6-18] 16 (09/12 0600) BP: (98-157)/(38-89) 107/38 mmHg (09/12 0600) SpO2:  [92 %-100 %] 98 % (09/12 0600) Arterial Line BP: (156-159)/(52-55) 159/52 mmHg (09/11 1830) Weight:  [231 lb 6 oz (104.951 kg)] 231 lb 6 oz (104.951 kg) (09/11 1247)  Intake/Output from previous day: 09/11 0701 - 09/12 0700 In: 500 [I.V.:500] Out: 50 [Blood:50] Intake/Output from this shift:    Physical Exam: Well appearing elderly man, NAD HEENT: Unremarkable Neck:  7 cm JVD, no thyromegally Back:  No CVA tenderness Lungs:  Clear with no wheezes, PM incision with minimal serosanguinous drainage HEART:  Regular rate rhythm, no murmurs, no rubs, no clicks Abd:  Flat, positive bowel sounds, no organomegally, no rebound, no guarding Ext:  2 plus pulses, no edema, no cyanosis, no clubbing Skin:  No rashes no nodules Neuro:  CN II through XII intact, motor grossly intact  Lab Results:  Recent Labs  02/10/13 1340  WBC 4.7  HGB 9.0*  PLT 88*    Recent Labs  02/10/13 1340  NA 135  K 5.0  CL 96  CO2 23  GLUCOSE 107*  BUN 53*  CREATININE 6.29*   No results found for this basename: TROPONINI, CK, MB,  in the last 72 hours Hepatic Function Panel No results found for this basename: PROT, ALBUMIN, AST, ALT, ALKPHOS, BILITOT, BILIDIR, IBILI,  in the last 72 hours No results found for this basename: CHOL,  in the last 72 hours No results found for this basename: PROTIME,  in the last 72 hours  Imaging: Dg Chest 2 View  02/11/2013   *RADIOLOGY REPORT*  Clinical Data: Status post pacemaker removal  CHEST - 2 VIEW  Comparison: 06/27/2004  Findings: The previously seen pacing device has been removed in the interval.  Cardiac  shadow is stable.  No retained foreign body is noted.  The lungs are well-aerated bilaterally without evidence of focal confluent infiltrate.  Small effusions are noted bilaterally.  IMPRESSION: No retained foreign body is noted.  Small effusions bilaterally.   Original Report Authenticated By: Alcide Clever, M.D.    Cardiac Studies: Tele - atrial fib with a controlled VR Assessment/Plan:  1. Pacemaker system infection 2. Atrial fibrillation 3. ESRD on HD Rec: doing well after PPM extraction. He will be ready for discharge tomorrow. He will need dialysis today. Ambulate  LOS: 1 day    Rennie Rouch,M.D. 02/11/2013, 8:00 AM

## 2013-02-11 NOTE — Consult Note (Signed)
Reason for Consult: To manage dialysis and dialysis related needs Referring Physician: Lewayne Bunting   HPI: [name] is a 72 y/o male admitted for removal of an infected PPM. It was removed 1 day ago without complication and he is doing well after the procedure. He describes that the pouch began to erode with thinning at the incision for about 1 month ago and had began eroding through his skin when he last presented to his cardiologist's office prompting its removal. He now denies any chest pain, dyspnea, abdominal pain, fever, chills, sweats, diarrhea, unusual swelling, or pain at all. He has a good appetite and is having some slight nausea with eating.   He states he has been on dialyisis for 4 years. He recently, about 1-2 months ago, had his L second toe removed due to an infection and diabetic complication. Records have been requested.   Dialyzes at [facility] in [place] on [address]   Past Medical History  Diagnosis Date  . CAD (coronary artery disease)     EF 40% 2011 CABG 2004  . PVD (peripheral vascular disease)   . Carotid artery disease   . Diverticulitis   . Complication of anesthesia     hard to wake up  . Hypotension     takes Midodrine only on dialysis days  . Sinus node dysfunction     noted following Afluttter RFCA-takes Proscar daily  . Atrial fibrillation     S/P AFlutter RFCA 2006  . Shortness of breath     tessalon to help with this and cough  . Asthma     Albuterol bid  . Pneumonia     hx of;last time Jan 2014  . History of bronchitis   . Parkinson's disease   . Confusion   . Short-term memory loss   . H/O multiple concussions     x 3 as a child  . Peripheral neuropathy   . Arthritis   . Joint pain   . Chronic back pain     DDD  . Joint swelling   . Bruises easily   . Thin skin   . H/O hiatal hernia   . GERD (gastroesophageal reflux disease)     takes Omeprazole daily  . History of colon polyps   . ESRD (end stage renal disease)    hemodialysis/M/W/F-[facility]  . Anemia   . History of blood transfusion     no abnormal reaction noted  . Pacemaker     Medtronic is rep  . Hypothyroidism     takes Synthroid daily-no thyroid  . Diabetes mellitus without complication     novolog flexpen prn  . Cataracts, bilateral     scheduled surgery already  . Depression     doesn't take any meds  . Insomnia     nothing works per wife    Past Surgical History  Procedure Laterality Date  . Tonsillectomy    . Bladder surgery    . Pacemaker insertion  1/24/6    Medtronic N-rhythm [id number]  . Insert / replace / remove pacemaker      Medtronic  . Femoral endarterectomy      left lower  . Toe amputation    . I&d of left foot 2nd toe     . Wound vac placed    . Coronary artery bypass graft      x 5  . Tonsillectomy    . Adenoidectomy    . Cholecystectomy    .  Cervical fusion    . Coronary angioplasty      several but unsure how many  . Colonoscopy    . Radiation to thyroid      History reviewed. No pertinent family history.  Social History:  reports that he has quit smoking. He does not have any smokeless tobacco history on file. He reports that he does not drink alcohol or use illicit drugs.  Allergies:  Allergies  Allergen Reactions  . Vancomycin Shortness Of Breath  . Celebrex [Celecoxib]     Shortness of breath  . Ciprofloxacin Itching  . Dilaudid [Hydromorphone Hcl]     Suicidal thoughts  . Morphine Itching  . Niacin Hives    Medications: I have reviewed the patient's current medications.   Results for orders placed during the hospital encounter of 02/10/13 (from the past 48 hour(s))  GLUCOSE, CAPILLARY     Status: Abnormal   Collection Time    02/10/13 12:51 PM      Result Value Range   Glucose-Capillary 104 (*) 70 - 99 mg/dL  BASIC METABOLIC PANEL     Status: Abnormal   Collection Time    02/10/13  1:40 PM      Result Value Range   Sodium 135  135 - 145 mEq/L   Potassium  5.0  3.5 - 5.1 mEq/L   Chloride 96  96 - 112 mEq/L   CO2 23  19 - 32 mEq/L   Glucose, Bld 107 (*) 70 - 99 mg/dL   BUN 53 (*) 6 - 23 mg/dL   Creatinine, Ser 1.61 (*) 0.50 - 1.35 mg/dL   Calcium 09.6  8.4 - 04.5 mg/dL   GFR calc non Af Amer 8 (*) >90 mL/min   GFR calc Af Amer 9 (*) >90 mL/min   Comment: (NOTE)     The eGFR has been calculated using the CKD EPI equation.     This calculation has not been validated in all clinical situations.     eGFR's persistently <90 mL/min signify possible Chronic Kidney     Disease.  CBC     Status: Abnormal   Collection Time    02/10/13  1:40 PM      Result Value Range   WBC 4.7  4.0 - 10.5 K/uL   RBC 3.34 (*) 4.22 - 5.81 MIL/uL   Hemoglobin 9.0 (*) 13.0 - 17.0 g/dL   HCT 40.9 (*) 81.1 - 91.4 %   MCV 87.7  78.0 - 100.0 fL   MCH 26.9  26.0 - 34.0 pg   MCHC 30.7  30.0 - 36.0 g/dL   RDW 78.2 (*) 95.6 - 21.3 %   Platelets 88 (*) 150 - 400 K/uL   Comment: PLATELET COUNT CONFIRMED BY SMEAR  TYPE AND SCREEN     Status: None   Collection Time    02/10/13  2:55 PM      Result Value Range   ABO/RH(D) A NEG     Antibody Screen NEG     Sample Expiration 02/13/2013     Unit Number [id number]     Blood Component Type RED CELLS,LR     Unit division 00     Status of Unit ALLOCATED     Transfusion Status OK TO TRANSFUSE     Crossmatch Result Compatible     Unit Number [id number]     Blood Component Type RBC LR PHER1     Unit division 00     Status of Unit ALLOCATED  Transfusion Status OK TO TRANSFUSE     Crossmatch Result Compatible    PREPARE RBC (CROSSMATCH)     Status: None   Collection Time    02/10/13  2:55 PM      Result Value Range   Order Confirmation ORDER PROCESSED BY BLOOD BANK    ABO/RH     Status: None   Collection Time    02/10/13  2:55 PM      Result Value Range   ABO/RH(D) A NEG    GLUCOSE, CAPILLARY     Status: None   Collection Time    02/10/13  5:42 PM      Result Value Range   Glucose-Capillary 95  70 - 99  mg/dL   Comment 1 Notify RN    GLUCOSE, CAPILLARY     Status: None   Collection Time    02/10/13  8:02 PM      Result Value Range   Glucose-Capillary 93  70 - 99 mg/dL   Comment 1 Notify RN    GLUCOSE, CAPILLARY     Status: Abnormal   Collection Time    02/11/13  7:32 AM      Result Value Range   Glucose-Capillary 108 (*) 70 - 99 mg/dL   Comment 1 Documented in Chart      Dg Chest 2 View  02/11/2013   *RADIOLOGY REPORT*  Clinical Data: Status post pacemaker removal  CHEST - 2 VIEW  Comparison: 06/27/2004  Findings: The previously seen pacing device has been removed in the interval.  Cardiac shadow is stable.  No retained foreign body is noted.  The lungs are well-aerated bilaterally without evidence of focal confluent infiltrate.  Small effusions are noted bilaterally.  IMPRESSION: No retained foreign body is noted.  Small effusions bilaterally.   Original Report Authenticated By: Alcide Clever, M.D.     ROS: Per HPI   Blood pressure 107/38, pulse 74, temperature 97.7 F (36.5 C), temperature source Oral, resp. rate 16, height 6' (1.829 m), weight 231 lb 6 oz (104.951 kg), SpO2 98.00%.  Gen: NAD, alert, cooperative with exam HEENT: NCAT, EOMI CV: irregularly irregular, good S1/S2, 3/6 systolic murmur Resp: CTABL, no wheezes, non-labored Abd: SNTND, BS present, no guarding or organomegaly Ext: 1+ edema, L 2nd toe amputated with clean dry intact bandage present Neuro: Alert and oriented,strength 5/5 and sensation intact in all 4 extremities   Assessment/Plan: 1. PPM infection s/p removal, doing well without complication. 2 ESRD on HD: Will dialyze today, records requested from [facility],  3. Afib- rate controlled, per primary 4. 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  5. DM2: per primary, PRN novolg 6. Hypothyroidism: continue synthroid  Murtis Sink, MD [facility] Resident, PGY-2 02/11/2013, 11:13  AM

## 2013-02-11 NOTE — Op Note (Signed)
NAME:  GAMAL, TODISCO NO.:  [id number]  MEDICAL RECORD NO.:  [id number]  LOCATION:  3W25C                        FACILITY:  [facility]  PHYSICIAN:  Doylene Canning. Ladona Ridgel, MD    DATE OF BIRTH:  [birth year]  DATE OF PROCEDURE:  02/10/2013 DATE OF DISCHARGE:                              OPERATIVE REPORT   PROCEDURE PERFORMED:  Extraction of a dual-chamber pacemaker system and removal of the pacemaker generator.  INDICATION:  Pacemaker pocket infection.  INTRODUCTION:  The patient is a 72 year old male with a history of symptomatic bradycardia and tachy-brady syndrome, he underwent pacemaker generator change several months ago, and was subsequently found to have swelling and thinning of the skin and eventually erosion to the patient's pacemaker pocket.  He is now referred for extraction of his entire system.  Of note, the patient is not pacemaker dependent.  DESCRIPTION OF PROCEDURE:  After informed consent was obtained, the patient was taken to the operating room in the fasting state.  The patient was sedated under the direction of Dr. Katrinka Blazing with general anesthesia and invasive arterial monitoring was established in his right arm.  A 30 mL of lidocaine was infiltrated into the left infraclavicular region and a 7-cm incision was carried out over this region. Electrocautery was utilized to dissect down to the pacemaker pocket and the generator was removed.  It should be noted that the generator was taken out of the skin prior to the surgery.  The pocket was grossly contaminated.  The leads were encased in dense fibrous scar tissue which were removed with electrocautery and gentle traction.  Having accomplished this, the sewing sleeves were also removed and a stylet was advanced into the lead with attempts to retract the helix in both atrial and ventricular leads carried out.  These leads were Medtronic 5076 leads.  Attention was then directed at the right ventricular lead.   The liberator locking stylet was advanced into the lead after it was cut and it was locked in place.  In the same way, liberator locking stylet was advanced into the atrial lead.  An 11-French Cook RL Shortie mechanical dissection sheath was advanced into the subclavian vein under fluoroscopic guidance and over the lead with gentle traction placed on the lead.  The [employer] 11-French mechanical extraction sheath was then advanced over the lead and utilizing a combination of pressure and counter pressure, traction and counter traction, along with activation of the cutting mechanism on the mechanical sheath, the lead was removed in total with no hemodynamic sequelae.  At this point, attention was then turned to the atrial lead.  The [facility] RL 11-French sheath was advanced back over the atrial lead and into the subclavian vein.  After traversing the dense fibrous binding site at the junction of the subclavian vein, first rib and clavicle, the atrial lead was removed in total.  There were no hemodynamic sequelae and at this point, pressure was placed over the pocket and hemostasis was assured.  At this point, the pocket was debrided extensively as there was very dense fibrous infected scar tissue present.  There was gross purulence in the pocket itself.  Having debrided out the fibrous scar tissue and irrigated the pocket extensively,  the incision was closed with 2-0 mattress Prolene suture.  The pocket was packed with Ioban gauze and a pressure dressing was placed and the patient was returned to the recovery area in satisfactory condition.  COMPLICATIONS:  There were no immediate procedure complications.  RESULTS:  This demonstrate successful extraction of a Medtronic dual- chamber pacemaker system in a patient with pacemaker pocket infection.     Doylene Canning. Ladona Ridgel, MD     GWT/MEDQ  D:  02/10/2013  T:  02/11/2013  Job:  [id number]  cc:   Dr. Lezlie Lye Antonieta Iba, MD

## 2013-02-11 NOTE — Consult Note (Signed)
I have personally seen and examined this patient and agree with the assessment/plan as outlined above by Ermalinda Memos MD (PGY2). HD today per schedule--mildly hyperkalemic. Franklin Baumbach K.,MD 02/11/2013 12:50 PM

## 2013-02-11 NOTE — Procedures (Signed)
Patient seen on Hemodialysis. QB 400, UF goal 3.5L Treatment adjusted as needed.  Zetta Bills MD [facility]. Office # [phone] Pager # [phone] 12:51 PM

## 2013-02-12 LAB — BASIC METABOLIC PANEL
CO2: 29 mEq/L (ref 19–32)
Chloride: 95 mEq/L — ABNORMAL LOW (ref 96–112)
Glucose, Bld: 111 mg/dL — ABNORMAL HIGH (ref 70–99)
Sodium: 135 mEq/L (ref 135–145)

## 2013-02-12 LAB — CBC
HCT: 26.3 % — ABNORMAL LOW (ref 39.0–52.0)
MCH: 27.1 pg (ref 26.0–34.0)
MCV: 88 fL (ref 78.0–100.0)
RBC: 2.99 MIL/uL — ABNORMAL LOW (ref 4.22–5.81)
WBC: 5 10*3/uL (ref 4.0–10.5)

## 2013-02-12 LAB — GLUCOSE, CAPILLARY: Glucose-Capillary: 98 mg/dL (ref 70–99)

## 2013-02-12 NOTE — Discharge Summary (Signed)
Physician Discharge Summary  Patient ID: Robert [name] MRN: [id number] DOB/AGE: 09/17/1940 72 y.o.  Admit date: 02/10/2013 Discharge date: 02/12/2013  Primary Discharge Diagnosis:  1. Pacemaker pocket infection  Secondary Discharge Diagnosis 1.CAD: CABG 2004 2.Carotid Artery Disease 3. Sinus Node Dysfunction 4. Atrial fibrillation S/P RFCA 2006 5 .ESRD 6. Diabetes  Consults: Nephrology to manage dialysis  Hospital Course: Robert [name] is a 72 year old patient admitted with pacemaker site infection, with known history of ischemic heart myopathy, chronic systolic heart failure with an EF of 40%, atrial flutter status post catheter ablation, and symptomatic bradycardia. The patient was seen by Dr. Ladona Ridgel one month ago, with erosion of pacemaker through skin with evidence of infection. He denied fevers or chills.    The patient had subsequent Medtronic dual-chamber pacemaker system extraction completed on 02/10/2013 per Dr. Ladona Ridgel. The patient was continued on Biaxin include amoxicillin. He will continue this regimen and on discharge until finished.   He was seen by Dr. Ermalinda Memos nephrology, with ongoing management of dialysis during hospitalization. He is normally undergoing dialysis via [facility].    Seen and examined by Dr. [name], M.D. stable for discharge, the left upper chest dressing was clean dry and free bleeding or hematoma. He will followup with Dr. Graciela Husbands on her previously scheduled appointment for ongoing assessment of wound and management of Sinus Node dysfunction.  Discharge Exam: Blood pressure 105/39, pulse 79, temperature 97.9 F (36.6 C), temperature source Oral, resp. rate 18, height 6' (1.829 m), weight 227 lb 10 oz (103.25 kg), SpO2 96.00%.   Labs:   Lab Results  Component Value Date   WBC 5.0 02/12/2013   HGB 8.1* 02/12/2013   HCT 26.3* 02/12/2013   MCV 88.0 02/12/2013   PLT 84* 02/12/2013    Recent Labs Lab 02/12/13 0015  NA 135  K 4.3  CL 95*  CO2  29  BUN 27*  CREATININE 4.80*  CALCIUM 9.3  GLUCOSE 111*       Radiology: Dg Chest 2 View  02/11/2013   *RADIOLOGY REPORT*  Clinical Data: Status post pacemaker removal  CHEST - 2 VIEW  Comparison: 06/27/2004  Findings: The previously seen pacing device has been removed in the interval.  Cardiac shadow is stable.  No retained foreign body is noted.  The lungs are well-aerated bilaterally without evidence of focal confluent infiltrate.  Small effusions are noted bilaterally.  IMPRESSION: No retained foreign body is noted.  Small effusions bilaterally.   Original Report Authenticated By: Alcide Clever, M.D.    WUJ:WJXB with T-wave abnormality infero/lateral  FOLLOW UP PLANS AND APPOINTMENTS Discharge Orders   Future Appointments Provider Department Dept Phone   02/16/2013 12:00 PM Duke Salvia, MD [facility] [phone]   Future Orders Complete By Expires   Diet - low sodium heart healthy  As directed    Diet - low sodium heart healthy  As directed    Increase activity slowly  As directed    Increase activity slowly  As directed        Medication List         amoxicillin-clavulanate 500-125 MG per tablet  Commonly known as:  AUGMENTIN  Take 1 tablet by mouth daily.     aspirin 81 MG EC tablet  Take 162 mg by mouth daily.     b complex vitamins tablet  Take 1 tablet by mouth daily.     benzonatate 100 MG capsule  Commonly known as:  TESSALON  Take 100 mg  by mouth 3 (three) times daily as needed for cough.     carisoprodol 350 MG tablet  Commonly known as:  SOMA  Take 350 mg by mouth every 8 (eight) hours as needed for muscle spasms.     Cholecalciferol 1000 UNITS capsule  Take 1,000 Units by mouth daily.     CoQ10 100 MG Caps  Take 1 capsule by mouth 2 (two) times daily.     finasteride 5 MG tablet  Commonly known as:  PROSCAR  Take 5 mg by mouth daily.     gabapentin 100 MG capsule  Commonly known as:  NEURONTIN  Take 100 mg by mouth 3  (three) times daily.     HYDROcodone-acetaminophen 10-325 MG per tablet  Commonly known as:  NORCO  Take 1-3 tablets by mouth every 8 (eight) hours as needed for pain.     levocetirizine 5 MG tablet  Commonly known as:  XYZAL  Take 5 mg by mouth every evening.     levothyroxine 175 MCG tablet  Commonly known as:  SYNTHROID, LEVOTHROID  Take 175 mcg by mouth daily before breakfast.     midodrine 5 MG tablet  Commonly known as:  PROAMATINE  Take 5 mg by mouth 3 (three) times daily.     NITROSTAT 0.4 MG SL tablet  Generic drug:  nitroGLYCERIN  Place 0.4 mg under the tongue every 5 (five) minutes as needed for chest pain.     NOVOLOG FLEXPEN 100 UNIT/ML injection  Generic drug:  insulin aspart  Inject 4 Units into the skin 3 (three) times daily as needed for high blood sugar.     omeprazole 40 MG capsule  Commonly known as:  PRILOSEC  Take 40 mg by mouth daily.     PROBIOTIC FORMULA PO  Take 1 tablet by mouth daily.     PROCRIT 3000 UNIT/ML injection  Generic drug:  epoetin alfa  Inject 6,000 Units into the skin 2 (two) times a week.     PROVENTIL HFA 108 (90 BASE) MCG/ACT inhaler  Generic drug:  albuterol  Inhale 2 puffs into the lungs every 6 (six) hours as needed for wheezing.     ropinirole 5 MG tablet  Commonly known as:  REQUIP  Take 5 mg by mouth 3 (three) times daily.           Follow-up Information   Follow up with Lewayne Bunting, MD. (Follow up with Dr. Ladona Ridgel or [facility]. Our office will call you for appt on Monday.)    Specialty:  Cardiology   Contact information:   [address] [place] [address] [phone]         Time spent with patient to include physician time:35 mintues Signed: Joni Reining 02/12/2013, 10:49 AM Co-Sign MD Patient seen and examined. I agree with the assessment and plan as detailed above. See also my additional thoughts below.   Brief her to my progress note from today also. I made  the decision for the patient to be discharged.  [name], MD, [facility] 02/13/2013 7:01 AM

## 2013-02-12 NOTE — Progress Notes (Signed)
Patient ID: Robert [name], male   DOB: 1940-11-11, 72 y.o.   MRN: [id number]   SUBJECTIVE: The patient had his pacemaker removed yesterday. He is stable.   Filed Vitals:   02/11/13 1401 02/11/13 1457 02/11/13 2141 02/12/13 0600  BP: 133/58 123/63 106/40 105/39  Pulse: 90 100 96 79  Temp:  97.3 F (36.3 C) 98.2 F (36.8 C) 97.9 F (36.6 C)  TempSrc:   Oral Oral  Resp:  18 18 18   Height:      Weight: 227 lb 10 oz (103.25 kg)     SpO2: 97% 97% 96% 96%    Intake/Output Summary (Last 24 hours) at 02/12/13 1042 Last data filed at 02/11/13 1700  Gross per 24 hour  Intake    240 ml  Output   3000 ml  Net  -2760 ml    LABS: Basic Metabolic Panel:  Recent Labs  14/78/29 1340 02/12/13 0015  NA 135 135  K 5.0 4.3  CL 96 95*  CO2 23 29  GLUCOSE 107* 111*  BUN 53* 27*  CREATININE 6.29* 4.80*  CALCIUM 10.0 9.3   Liver Function Tests: No results found for this basename: AST, ALT, ALKPHOS, BILITOT, PROT, ALBUMIN,  in the last 72 hours No results found for this basename: LIPASE, AMYLASE,  in the last 72 hours CBC:  Recent Labs  02/10/13 1340 02/12/13 0015  WBC 4.7 5.0  HGB 9.0* 8.1*  HCT 29.3* 26.3*  MCV 87.7 88.0  PLT 88* 84*   Cardiac Enzymes: No results found for this basename: CKTOTAL, CKMB, CKMBINDEX, TROPONINI,  in the last 72 hours BNP: No components found with this basename: POCBNP,  D-Dimer: No results found for this basename: DDIMER,  in the last 72 hours Hemoglobin A1C: No results found for this basename: HGBA1C,  in the last 72 hours Fasting Lipid Panel: No results found for this basename: CHOL, HDL, LDLCALC, TRIG, CHOLHDL, LDLDIRECT,  in the last 72 hours Thyroid Function Tests: No results found for this basename: TSH, T4TOTAL, FREET3, T3FREE, THYROIDAB,  in the last 72 hours  RADIOLOGY: Dg Chest 2 View  02/11/2013   *RADIOLOGY REPORT*  Clinical Data: Status post pacemaker removal  CHEST - 2 VIEW  Comparison: 06/27/2004  Findings: The  previously seen pacing device has been removed in the interval.  Cardiac shadow is stable.  No retained foreign body is noted.  The lungs are well-aerated bilaterally without evidence of focal confluent infiltrate.  Small effusions are noted bilaterally.  IMPRESSION: No retained foreign body is noted.  Small effusions bilaterally.   Original Report Authenticated By: Alcide Clever, M.D.    PHYSICAL EXAM patient is oriented to person time and place. He is ready to go home. I spoke with his family in person. Bandages is still in place above his pacemaker.   TELEMETRY: I reviewed telemetry today February 12, 2013. There is an interventricular conduction delay. There is atrial fibrillation with controlled rate.   ASSESSMENT AND PLAN:  Atrial fibrillation    Rate is controlled  Status post pacemaker removal for infected pacemaker site    He's doing well and made to go home.  Robert [name] 02/12/2013 10:42 AM

## 2013-02-14 LAB — TYPE AND SCREEN: ABO/RH(D): A NEG

## 2013-02-14 NOTE — Progress Notes (Signed)
Utilization review completed.  

## 2013-02-16 ENCOUNTER — Ambulatory Visit (INDEPENDENT_AMBULATORY_CARE_PROVIDER_SITE_OTHER): Payer: Medicare Other | Admitting: Internal Medicine

## 2013-02-16 ENCOUNTER — Encounter: Payer: Self-pay | Admitting: Internal Medicine

## 2013-02-16 VITALS — BP 109/54 | HR 80 | Ht 72.0 in | Wt 231.0 lb

## 2013-02-16 DIAGNOSIS — I509 Heart failure, unspecified: Secondary | ICD-10-CM

## 2013-02-16 DIAGNOSIS — I951 Orthostatic hypotension: Secondary | ICD-10-CM

## 2013-02-16 DIAGNOSIS — T827XXD Infection and inflammatory reaction due to other cardiac and vascular devices, implants and grafts, subsequent encounter: Secondary | ICD-10-CM

## 2013-02-16 DIAGNOSIS — I4891 Unspecified atrial fibrillation: Secondary | ICD-10-CM

## 2013-02-16 NOTE — Patient Instructions (Addendum)
Your physician recommends that you schedule a follow-up appointment next week with Gunnar Fusi here in [place].  Your physician recommends that you continue on your current medications as directed. Please refer to the Current Medication list given to you today.

## 2013-02-16 NOTE — Progress Notes (Signed)
Patient Care Team: Sherrie Mustache, MD as PCP - General (Internal Medicine)   HPI  Robert [name] is a 72 y.o. 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 He is involved permanent atrial fibrillation. He underwent pacemaker generator replacement 9/13;  July 2014 tenderness and erythema was noted at his pacemaker site; he seems excessively over intervening months all the while declining extraction. In early September he presented with device erosion and on September 11 underwent device extraction which was successfully completed.    Past Medical History  Diagnosis Date  . CAD (coronary artery disease)     EF 40% 2011 CABG 2004  . PVD (peripheral vascular disease)   . Carotid artery disease   . Diverticulitis   . Complication of anesthesia     hard to wake up  . Hypotension     takes Midodrine only on dialysis days  . Sinus node dysfunction     noted following Afluttter RFCA-takes Proscar daily  . Atrial fibrillation     S/P AFlutter RFCA 2006  . Shortness of breath     tessalon to help with this and cough  . Asthma     Albuterol bid  . Pneumonia     hx of;last time Jan 2014  . History of bronchitis   . Parkinson's disease   . Confusion   . Short-term memory loss   . H/O multiple concussions     x 3 as a child  . Peripheral neuropathy   . Arthritis   . Joint pain   . Chronic back pain     DDD  . Joint swelling   . Bruises easily   . Thin skin   . H/O hiatal hernia   . GERD (gastroesophageal reflux disease)     takes Omeprazole daily  . History of colon polyps   . ESRD (end stage renal disease)     hemodialysis/M/W/F-[facility] [employer] [facility]  . Anemia   . History of blood transfusion     no abnormal reaction noted  . Pacemaker     Medtronic is rep  . Hypothyroidism     takes Synthroid daily-no thyroid  . Diabetes mellitus without complication     novolog flexpen prn  . Cataracts,  bilateral     scheduled surgery already  . Depression     doesn't take any meds  . Insomnia     nothing works per wife    Past Surgical History  Procedure Laterality Date  . Tonsillectomy    . Bladder surgery    . Pacemaker insertion  1/24/6    Medtronic N-rhythm P1501DR  . Insert / replace / remove pacemaker      Medtronic  . Femoral endarterectomy      left lower  . Toe amputation    . I&d of left foot 2nd toe     . Wound vac placed    . Coronary artery bypass graft      x 5  . Tonsillectomy    . Adenoidectomy    . Cholecystectomy    . Cervical fusion    . Coronary angioplasty      several but unsure how many  . Colonoscopy    . Radiation to thyroid    . Pacemaker lead removal Left 02/10/2013    Procedure: PACEMAKER LEAD REMOVAL;  Surgeon: Marinus Maw, MD;  Location: [facility] OR;  Service: Cardiovascular;  Laterality: Left;  Current Outpatient Prescriptions  Medication Sig Dispense Refill  . albuterol (PROVENTIL HFA) 108 (90 BASE) MCG/ACT inhaler Inhale 2 puffs into the lungs every 6 (six) hours as needed for wheezing.       Marland Kitchen amoxicillin-clavulanate (AUGMENTIN) 500-125 MG per tablet Take 1 tablet by mouth daily.       Marland Kitchen aspirin 81 MG EC tablet Take 162 mg by mouth daily.        Marland Kitchen b complex vitamins tablet Take 1 tablet by mouth daily.        . benzonatate (TESSALON) 100 MG capsule Take 100 mg by mouth 3 (three) times daily as needed for cough.      . carisoprodol (SOMA) 350 MG tablet Take 350 mg by mouth every 8 (eight) hours as needed for muscle spasms.       . Cholecalciferol 1000 UNITS capsule Take 1,000 Units by mouth daily.       . Coenzyme Q10 (COQ10) 100 MG CAPS Take 1 capsule by mouth 2 (two) times daily.       Marland Kitchen epoetin alfa (PROCRIT) 3000 UNIT/ML injection Inject 6,000 Units into the skin 2 (two) times a week.        . finasteride (PROSCAR) 5 MG tablet Take 5 mg by mouth daily.      Marland Kitchen gabapentin (NEURONTIN) 100 MG capsule Take 100 mg by mouth 3 (three) times  daily.       Marland Kitchen HYDROcodone-acetaminophen (NORCO) 10-325 MG per tablet Take 1-3 tablets by mouth every 8 (eight) hours as needed for pain.       Marland Kitchen insulin aspart (NOVOLOG FLEXPEN) 100 UNIT/ML injection Inject 4 Units into the skin 3 (three) times daily as needed for high blood sugar.      . levocetirizine (XYZAL) 5 MG tablet Take 5 mg by mouth every evening.      Marland Kitchen levothyroxine (SYNTHROID, LEVOTHROID) 175 MCG tablet Take 175 mcg by mouth daily before breakfast.      . midodrine (PROAMATINE) 5 MG tablet Take 5 mg by mouth 3 (three) times daily.      Marland Kitchen NITROSTAT 0.4 MG SL tablet Place 0.4 mg under the tongue every 5 (five) minutes as needed for chest pain.       Marland Kitchen omeprazole (PRILOSEC) 40 MG capsule Take 40 mg by mouth daily.        . Probiotic Product (PROBIOTIC FORMULA PO) Take 1 tablet by mouth daily.       . ropinirole (REQUIP) 5 MG tablet Take 5 mg by mouth as needed.        No current facility-administered medications for this visit.    Allergies  Allergen Reactions  . Vancomycin Shortness Of Breath  . Celebrex [Celecoxib]     Shortness of breath  . Ciprofloxacin Itching  . Dilaudid [Hydromorphone Hcl]     Suicidal thoughts  . Morphine Itching  . Niacin Hives    Review of Systems negative except from HPI and PMH  Physical Exam BP 109/54  Pulse 80  Ht 6' (1.829 m)  Wt 231 lb (104.781 kg)  BMI 31.32 kg/m2  Every other suture was removed. A drain was left in place. The wound is without erythema there is no significant swelling there is mild drainage   Assessment and  Plan

## 2013-02-16 NOTE — Assessment & Plan Note (Signed)
Improving following device  r removal; we will pull the drain next week

## 2013-02-25 ENCOUNTER — Ambulatory Visit (INDEPENDENT_AMBULATORY_CARE_PROVIDER_SITE_OTHER): Payer: Medicare Other | Admitting: *Deleted

## 2013-02-25 DIAGNOSIS — T827XXA Infection and inflammatory reaction due to other cardiac and vascular devices, implants and grafts, initial encounter: Secondary | ICD-10-CM

## 2013-02-25 NOTE — Progress Notes (Signed)
Sutures and packing removed.  Wound irrigated with NSS and repacked with iodoform and redressed.  Patient to return 03/07/13 for recheck.

## 2013-03-07 ENCOUNTER — Ambulatory Visit (INDEPENDENT_AMBULATORY_CARE_PROVIDER_SITE_OTHER): Payer: Medicare Other | Admitting: *Deleted

## 2013-03-07 DIAGNOSIS — T827XXA Infection and inflammatory reaction due to other cardiac and vascular devices, implants and grafts, initial encounter: Secondary | ICD-10-CM

## 2013-03-07 NOTE — Progress Notes (Signed)
Wound irrigated with NSS, repacked with iodoform and redressed.

## 2013-03-15 ENCOUNTER — Ambulatory Visit: Payer: Medicare Other

## 2013-04-11 ENCOUNTER — Ambulatory Visit: Payer: Self-pay | Admitting: Ophthalmology

## 2013-04-26 ENCOUNTER — Encounter: Payer: Self-pay | Admitting: Internal Medicine

## 2013-04-26 ENCOUNTER — Telehealth: Payer: Self-pay | Admitting: Internal Medicine

## 2013-04-26 ENCOUNTER — Ambulatory Visit (INDEPENDENT_AMBULATORY_CARE_PROVIDER_SITE_OTHER): Payer: Medicare Other | Admitting: Internal Medicine

## 2013-04-26 VITALS — BP 87/46 | HR 80 | Ht 73.0 in | Wt 233.0 lb

## 2013-04-26 DIAGNOSIS — I509 Heart failure, unspecified: Secondary | ICD-10-CM

## 2013-04-26 DIAGNOSIS — I4891 Unspecified atrial fibrillation: Secondary | ICD-10-CM

## 2013-04-26 DIAGNOSIS — T827XXD Infection and inflammatory reaction due to other cardiac and vascular devices, implants and grafts, subsequent encounter: Secondary | ICD-10-CM

## 2013-04-26 DIAGNOSIS — I2510 Atherosclerotic heart disease of native coronary artery without angina pectoris: Secondary | ICD-10-CM

## 2013-04-26 DIAGNOSIS — Z5189 Encounter for other specified aftercare: Secondary | ICD-10-CM

## 2013-04-26 NOTE — Assessment & Plan Note (Signed)
Patient has permanent now atrial fibrillation. We discussed anticoagulation and the relative inadequacy of aspirin. And will seek to getwith the patient's physicians who discontinued anticoagulation in the past

## 2013-04-26 NOTE — Progress Notes (Signed)
Patient Care Team: Sherrie Mustache, MD as PCP - General (Internal Medicine)   HPI  Robert [name] is a 72 y.o. 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 He has evolved permanent atrial fibrillation w controlled VR    He underwent pacemaker generator replacement 9/13;complicated by infection and subsequent extraction. Given the progression of his atrial fibrillation permanent in the elimination of his sinus node dysfunction device implantation was not done  His wife has many concerns about the pacemaker procedure. They're concerned about the size of the reimplant device compared to the original device. They're concerned as to whether this replace the device was in fact indicated. She was frustrated that Dr. Ladona Ridgel spoke with her by telephone but not face-to-face following the extraction procedure  He is not on anticoagulants. These were stopped he thinks by Dr.Gitten or nephrology     Past Medical History  Diagnosis Date  . CAD (coronary artery disease)     EF 40% 2011 CABG 2004  . PVD (peripheral vascular disease)   . Carotid artery disease   . Diverticulitis   . Complication of anesthesia     hard to wake up  . Hypotension     takes Midodrine only on dialysis days  . Sinus node dysfunction     noted following Afluttter RFCA-takes Proscar daily  . Atrial fibrillation     S/P AFlutter RFCA 2006  . Shortness of breath     tessalon to help with this and cough  . Asthma     Albuterol bid  . Pneumonia     hx of;last time Jan 2014  . History of bronchitis   . Parkinson's disease   . Confusion   . Short-term memory loss   . H/O multiple concussions     x 3 as a child  . Peripheral neuropathy   . Arthritis   . Joint pain   . Chronic back pain     DDD  . Joint swelling   . Bruises easily   . Thin skin   . H/O hiatal hernia   . GERD (gastroesophageal reflux disease)     takes  Omeprazole daily  . History of colon polyps   . ESRD (end stage renal disease)     hemodialysis/M/W/F-[facility] [employer] [facility]  . Anemia   . History of blood transfusion     no abnormal reaction noted  . Pacemaker     Medtronic is rep  . Hypothyroidism     takes Synthroid daily-no thyroid  . Diabetes mellitus without complication     novolog flexpen prn  . Cataracts, bilateral     scheduled surgery already  . Depression     doesn't take any meds  . Insomnia     nothing works per wife    Past Surgical History  Procedure Laterality Date  . Tonsillectomy    . Bladder surgery    . Pacemaker insertion  1/24/6    Medtronic N-rhythm P1501DR  . Insert / replace / remove pacemaker      Medtronic  . Femoral endarterectomy      left lower  . Toe amputation    . I&d of left foot 2nd toe     . Wound vac placed    . Coronary artery bypass graft      x 5  . Tonsillectomy    . Adenoidectomy    . Cholecystectomy    .  Cervical fusion    . Coronary angioplasty      several but unsure how many  . Colonoscopy    . Radiation to thyroid    . Pacemaker lead removal Left 02/10/2013    Procedure: PACEMAKER LEAD REMOVAL;  Surgeon: Marinus Maw, MD;  Location: [facility] OR;  Service: Cardiovascular;  Laterality: Left;  . Cataract extraction Left     Current Outpatient Prescriptions  Medication Sig Dispense Refill  . albuterol (PROVENTIL HFA) 108 (90 BASE) MCG/ACT inhaler Inhale 2 puffs into the lungs every 6 (six) hours as needed for wheezing.       Marland Kitchen amoxicillin-clavulanate (AUGMENTIN) 500-125 MG per tablet Take 1 tablet by mouth 2 (two) times daily.       Marland Kitchen aspirin 81 MG EC tablet Take 162 mg by mouth daily.        Marland Kitchen b complex vitamins tablet Take 1 tablet by mouth daily.        . benzonatate (TESSALON) 100 MG capsule Take 100 mg by mouth 3 (three) times daily as needed for cough.      . carisoprodol (SOMA) 350 MG tablet Take 350 mg by mouth daily.       . Cholecalciferol (VITAMIN D3)  2000 UNITS capsule Take 2,000 Units by mouth daily.      . ciprofloxacin (CIPRO) 500 MG tablet Take 500 mg by mouth 2 (two) times daily.      . Coenzyme Q10 (COQ10) 100 MG CAPS Take 1 capsule by mouth daily.       . Difluprednate (DUREZOL) 0.05 % EMUL Apply to eye daily.      Marland Kitchen doxycycline (VIBRAMYCIN) 100 MG capsule Take 100 mg by mouth 2 (two) times daily.      Marland Kitchen epoetin alfa (PROCRIT) 3000 UNIT/ML injection Inject 6,000 Units into the skin 2 (two) times a week.        . fentaNYL (DURAGESIC - DOSED MCG/HR) 25 MCG/HR patch 1 patch every 3 (three) days.      . finasteride (PROSCAR) 5 MG tablet Take 5 mg by mouth daily.      Marland Kitchen gabapentin (NEURONTIN) 100 MG capsule Take 100 mg by mouth 3 (three) times daily.       Marland Kitchen HYDROcodone-acetaminophen (NORCO) 10-325 MG per tablet Take 1-3 tablets by mouth every 8 (eight) hours as needed for pain.       Marland Kitchen insulin aspart (NOVOLOG FLEXPEN) 100 UNIT/ML injection Inject 4 Units into the skin 3 (three) times daily as needed for high blood sugar.      . Lactobacillus-Inulin (CULTURELLE DIGESTIVE HEALTH PO) Take by mouth. 1 or 2 tabs as needed      . levothyroxine (SYNTHROID, LEVOTHROID) 150 MCG tablet Take 150 mcg by mouth daily before breakfast.      . midodrine (PROAMATINE) 5 MG tablet Take 5 mg by mouth as needed.       . multivitamin (RENA-VIT) TABS tablet Take 1 tablet by mouth daily.      Marland Kitchen NITROSTAT 0.4 MG SL tablet Place 0.4 mg under the tongue every 5 (five) minutes as needed for chest pain.       . NON FORMULARY Renal tablets 167 mg 3 tablets with meals. Min of 9 daily      . NON FORMULARY 0.3 %. llevro 2 drops daily left eye      . omeprazole (PRILOSEC) 40 MG capsule Take 40 mg by mouth daily.        . Probiotic Product (PROBIOTIC  FORMULA PO) Take 1 tablet by mouth daily.       . ropinirole (REQUIP) 5 MG tablet Take 5 mg by mouth 3 (three) times daily.        No current facility-administered medications for this visit.    Allergies  Allergen  Reactions  . Vancomycin Shortness Of Breath  . Celebrex [Celecoxib]     Shortness of breath  . Ciprofloxacin Itching  . Dilaudid [Hydromorphone Hcl]     Suicidal thoughts  . Morphine Itching  . Niacin Hives    Review of Systems negative except from HPI and PMH  Physical Exam BP 87/46  Pulse 80  Ht 6\' 1"  (1.854 m)  Wt 233 lb (105.688 kg)  BMI 30.75 kg/m2 Well developed and nourished not very comfortable HENT normal Neck supple  lat Clear Irregularly irregularr rate and rhythm, 2/6 systolic murmur Abd-soft with active BS No Clubbing cyanosis 1-2+ and symmetric edema Skin-warm and dry A & Oriented  Grossly normal sensory and motor function  ECG demonstrates atrial fibrillation with controlled ventricular response with right bundle left posterior fascicular block Intervals-/18/43 Axis 144   Assessment and  Plan

## 2013-04-26 NOTE — Patient Instructions (Signed)
Your physician wants you to follow-up in: 6 months with Dr. Klein. You will receive a reminder letter in the mail two months in advance. If you don't receive a letter, please call our office to schedule the follow-up appointment.  Your physician recommends that you continue on your current medications as directed. Please refer to the Current Medication list given to you today.  

## 2013-04-26 NOTE — Telephone Encounter (Signed)
Per Dr. Graciela Husbands, he spoke with Dr. Toma Deiters (the patient's hematologist) today. They have concurred that the best approach for the patient would be to start him on lovenox 20 mg once daily for 5 days as a bridge to coumadin being restarted. Goal INR would be 1.8-2.2. The patient would discontinue aspirin when starting lovenox. 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 Sherri Rad, RN, BSN  I have notified the patient and his wife about restarting coumadin. They are greatly concerned about restarting this due to the issues that he had  previously and the amount of time this consumed for them trying to get his coumadin managed. Dr. Toma Deiters did follow coumadin for the patient previously. Per the patient's wife, she does not feel the patient will want to proceed with this plan. The risk of not going on blood thinner have been discussed with her again. I also advised if the coumadin was not tolerated by the patient, he had to stop it, and he would have the same risk as if he not restarted it at all. They are concerned about issues with his dialysis as well. I have advised that they could always touch base with Dr. Toma Deiters to discuss further. They will think about this and then call back on Friday. I explained that we would involve our coumadin clinic in the initiation of coumadin and that he could be followed in the [place] office, however, the coumadin nurse is only there on wednesdays which is his HD day. I have asked that the patient or his wife call back to the [place] office on Friday and speak with Dr. Odessa Fleming nurse, Roanna Raider, so she can relay information to him. They are agreeable. Sherri Rad, RN, BSN

## 2013-04-26 NOTE — Assessment & Plan Note (Signed)
The patient's wife has many questions regarding device reimplantation procedure. We will try to get some information for her regarding device size. We Review device reimplantation implications.

## 2013-04-26 NOTE — Assessment & Plan Note (Signed)
For now we'll continue aspirin, although I would be inclined to decrease the dose from 162--81

## 2013-04-29 NOTE — Telephone Encounter (Signed)
noted 

## 2013-04-29 NOTE — Telephone Encounter (Signed)
Follow up    Pt's wife returning your call. She did not give much detail.

## 2013-05-02 NOTE — Telephone Encounter (Signed)
Returned call - patient's wife complains of having to try Coumadin again - she states that last time they could not control it, it was always up and down, they didn't like this medication. I explained that I would review this with Dr. Graciela Husbands and get back with her. She is interested in discussing other options. Patient agreeable to plan.

## 2013-05-02 NOTE — Telephone Encounter (Signed)
Follow Up ° °Pt returned call. Please call back  °

## 2013-05-03 NOTE — Telephone Encounter (Signed)
Follow Up ° °Pt returned call//  °

## 2013-05-05 NOTE — Telephone Encounter (Signed)
Advised that Dr. Graciela Husbands recommends Apixaban. They will discuss this with nephrology tomorrow at dialysis and get back with [place] on decision.

## 2013-05-05 NOTE — Telephone Encounter (Signed)
Follow Up ° °Pt wife returned the call  °

## 2013-05-06 NOTE — Telephone Encounter (Signed)
Patient's Nephrologist called Dr. Graciela Husbands today - decision made by nephrology and patient to not start anticoagulation.

## 2013-05-23 ENCOUNTER — Ambulatory Visit: Payer: Self-pay | Admitting: Internal Medicine

## 2013-05-25 ENCOUNTER — Encounter: Payer: Self-pay | Admitting: Surgery

## 2013-05-27 ENCOUNTER — Ambulatory Visit: Payer: Self-pay | Admitting: Internal Medicine

## 2013-05-27 LAB — CBC CANCER CENTER
Basophil #: 0 x10 3/mm (ref 0.0–0.1)
Eosinophil #: 0.1 x10 3/mm (ref 0.0–0.7)
HCT: 26.8 % — ABNORMAL LOW (ref 40.0–52.0)
Lymphocyte #: 0.9 x10 3/mm — ABNORMAL LOW (ref 1.0–3.6)
Lymphocyte %: 18.5 %
MCV: 82 fL (ref 80–100)
Monocyte %: 15.9 %
Platelet: 95 x10 3/mm — ABNORMAL LOW (ref 150–440)
RBC: 3.26 10*6/uL — ABNORMAL LOW (ref 4.40–5.90)
RDW: 19.1 % — ABNORMAL HIGH (ref 11.5–14.5)

## 2013-06-02 ENCOUNTER — Ambulatory Visit: Payer: Self-pay | Admitting: Internal Medicine

## 2013-06-08 ENCOUNTER — Ambulatory Visit: Payer: Self-pay | Admitting: Vascular Surgery

## 2013-06-08 LAB — BASIC METABOLIC PANEL
ANION GAP: 4 — AB (ref 7–16)
BUN: 20 mg/dL — ABNORMAL HIGH (ref 7–18)
Calcium, Total: 9.1 mg/dL (ref 8.5–10.1)
Chloride: 101 mmol/L (ref 98–107)
Co2: 29 mmol/L (ref 21–32)
Creatinine: 4.09 mg/dL — ABNORMAL HIGH (ref 0.60–1.30)
EGFR (African American): 16 — ABNORMAL LOW
EGFR (Non-African Amer.): 13 — ABNORMAL LOW
GLUCOSE: 115 mg/dL — AB (ref 65–99)
Osmolality: 272 (ref 275–301)
Potassium: 4.1 mmol/L (ref 3.5–5.1)
SODIUM: 134 mmol/L — AB (ref 136–145)

## 2013-06-09 ENCOUNTER — Ambulatory Visit: Payer: Self-pay | Admitting: Internal Medicine

## 2013-06-16 ENCOUNTER — Encounter: Payer: Self-pay | Admitting: Surgery

## 2013-06-21 LAB — CBC WITH DIFFERENTIAL/PLATELET
BASOS PCT: 0.5 %
Basophil #: 0 10*3/uL (ref 0.0–0.1)
EOS PCT: 2.1 %
Eosinophil #: 0.1 10*3/uL (ref 0.0–0.7)
HCT: 21.8 % — ABNORMAL LOW (ref 40.0–52.0)
HGB: 6.4 g/dL — AB (ref 13.0–18.0)
LYMPHS PCT: 18.2 %
Lymphocyte #: 1 10*3/uL (ref 1.0–3.6)
MCH: 24.9 pg — ABNORMAL LOW (ref 26.0–34.0)
MCHC: 29.4 g/dL — ABNORMAL LOW (ref 32.0–36.0)
MCV: 85 fL (ref 80–100)
MONO ABS: 0.6 x10 3/mm (ref 0.2–1.0)
MONOS PCT: 11.2 %
Neutrophil #: 3.7 10*3/uL (ref 1.4–6.5)
Neutrophil %: 68 %
Platelet: 132 10*3/uL — ABNORMAL LOW (ref 150–440)
RBC: 2.57 10*6/uL — ABNORMAL LOW (ref 4.40–5.90)
RDW: 21 % — ABNORMAL HIGH (ref 11.5–14.5)
WBC: 5.5 10*3/uL (ref 3.8–10.6)

## 2013-06-21 LAB — BASIC METABOLIC PANEL
Anion Gap: 8 (ref 7–16)
BUN: 52 mg/dL — ABNORMAL HIGH (ref 7–18)
CALCIUM: 9.7 mg/dL (ref 8.5–10.1)
CHLORIDE: 105 mmol/L (ref 98–107)
Co2: 25 mmol/L (ref 21–32)
Creatinine: 6.6 mg/dL — ABNORMAL HIGH (ref 0.60–1.30)
EGFR (African American): 9 — ABNORMAL LOW
EGFR (Non-African Amer.): 8 — ABNORMAL LOW
GLUCOSE: 144 mg/dL — AB (ref 65–99)
OSMOLALITY: 292 (ref 275–301)
Potassium: 4.8 mmol/L (ref 3.5–5.1)
Sodium: 138 mmol/L (ref 136–145)

## 2013-06-21 LAB — TROPONIN I: Troponin-I: 0.03 ng/mL

## 2013-06-22 ENCOUNTER — Inpatient Hospital Stay: Payer: Self-pay | Admitting: Internal Medicine

## 2013-06-22 LAB — CBC WITH DIFFERENTIAL/PLATELET
BASOS ABS: 0 10*3/uL (ref 0.0–0.1)
BASOS PCT: 0.8 %
Basophil #: 0 10*3/uL (ref 0.0–0.1)
Basophil %: 0.9 %
EOS PCT: 2.7 %
EOS PCT: 1 %
Eosinophil #: 0.1 10*3/uL (ref 0.0–0.7)
Eosinophil #: 0 10*3/uL (ref 0.0–0.7)
HCT: 26.2 % — ABNORMAL LOW (ref 40.0–52.0)
HCT: 20 % — ABNORMAL LOW (ref 40.0–52.0)
HGB: 8 g/dL — ABNORMAL LOW (ref 13.0–18.0)
HGB: 6 g/dL — AB (ref 13.0–18.0)
LYMPHS ABS: 1 10*3/uL (ref 1.0–3.6)
LYMPHS ABS: 0.8 10*3/uL — AB (ref 1.0–3.6)
LYMPHS PCT: 25 %
Lymphocyte %: 16.6 %
MCH: 26.9 pg (ref 26.0–34.0)
MCH: 25.2 pg — ABNORMAL LOW (ref 26.0–34.0)
MCHC: 30.7 g/dL — ABNORMAL LOW (ref 32.0–36.0)
MCHC: 30.2 g/dL — ABNORMAL LOW (ref 32.0–36.0)
MCV: 88 fL (ref 80–100)
MCV: 84 fL (ref 80–100)
MONO ABS: 0.5 x10 3/mm (ref 0.2–1.0)
MONO ABS: 0.6 x10 3/mm (ref 0.2–1.0)
Monocyte %: 13.6 %
Monocyte %: 12.8 %
NEUTROS ABS: 3.2 10*3/uL (ref 1.4–6.5)
NEUTROS PCT: 68.8 %
Neutrophil #: 2.2 10*3/uL (ref 1.4–6.5)
Neutrophil %: 57.8 %
Platelet: 95 10*3/uL — ABNORMAL LOW (ref 150–440)
Platelet: 115 10*3/uL — ABNORMAL LOW (ref 150–440)
RBC: 2.98 10*6/uL — ABNORMAL LOW (ref 4.40–5.90)
RBC: 2.39 10*6/uL — ABNORMAL LOW (ref 4.40–5.90)
RDW: 18.2 % — ABNORMAL HIGH (ref 11.5–14.5)
RDW: 19.4 % — ABNORMAL HIGH (ref 11.5–14.5)
WBC: 3.8 10*3/uL (ref 3.8–10.6)
WBC: 4.7 10*3/uL (ref 3.8–10.6)

## 2013-06-22 LAB — IRON AND TIBC
Iron Bind.Cap.(Total): 292 ug/dL (ref 250–450)
Iron Saturation: 9 %
Iron: 26 ug/dL — ABNORMAL LOW (ref 65–175)
Unbound Iron-Bind.Cap.: 266 ug/dL

## 2013-06-22 LAB — PHOSPHORUS: Phosphorus: 5.1 mg/dL — ABNORMAL HIGH (ref 2.5–4.9)

## 2013-06-22 LAB — FERRITIN: Ferritin (ARMC): 38 ng/mL (ref 8–388)

## 2013-06-23 LAB — BASIC METABOLIC PANEL
Anion Gap: 6 — ABNORMAL LOW (ref 7–16)
BUN: 32 mg/dL — ABNORMAL HIGH (ref 7–18)
CHLORIDE: 99 mmol/L (ref 98–107)
CREATININE: 4.8 mg/dL — AB (ref 0.60–1.30)
Calcium, Total: 8.9 mg/dL (ref 8.5–10.1)
Co2: 31 mmol/L (ref 21–32)
EGFR (Non-African Amer.): 11 — ABNORMAL LOW
GFR CALC AF AMER: 13 — AB
Glucose: 94 mg/dL (ref 65–99)
Osmolality: 279 (ref 275–301)
Potassium: 4.2 mmol/L (ref 3.5–5.1)
Sodium: 136 mmol/L (ref 136–145)

## 2013-06-23 LAB — CBC WITH DIFFERENTIAL/PLATELET
Basophil #: 0 10*3/uL (ref 0.0–0.1)
Basophil %: 0.8 %
Eosinophil #: 0.1 10*3/uL (ref 0.0–0.7)
Eosinophil %: 2.1 %
HCT: 20.5 % — AB (ref 40.0–52.0)
HGB: 6.2 g/dL — AB (ref 13.0–18.0)
Lymphocyte #: 0.9 10*3/uL — ABNORMAL LOW (ref 1.0–3.6)
Lymphocyte %: 20.2 %
MCH: 25.5 pg — AB (ref 26.0–34.0)
MCHC: 30.3 g/dL — ABNORMAL LOW (ref 32.0–36.0)
MCV: 84 fL (ref 80–100)
MONO ABS: 0.6 x10 3/mm (ref 0.2–1.0)
Monocyte %: 13.8 %
NEUTROS ABS: 2.7 10*3/uL (ref 1.4–6.5)
NEUTROS PCT: 63.1 %
Platelet: 104 10*3/uL — ABNORMAL LOW (ref 150–440)
RBC: 2.44 10*6/uL — AB (ref 4.40–5.90)
RDW: 20.3 % — ABNORMAL HIGH (ref 11.5–14.5)
WBC: 4.2 10*3/uL (ref 3.8–10.6)

## 2013-06-23 LAB — HEMOGLOBIN: HGB: 7 g/dL — ABNORMAL LOW (ref 13.0–18.0)

## 2013-06-24 LAB — CBC WITH DIFFERENTIAL/PLATELET
Basophil #: 0 10*3/uL (ref 0.0–0.1)
Basophil %: 0.6 %
EOS PCT: 2.2 %
Eosinophil #: 0.1 10*3/uL (ref 0.0–0.7)
HCT: 22 % — AB (ref 40.0–52.0)
HGB: 6.8 g/dL — ABNORMAL LOW (ref 13.0–18.0)
Lymphocyte #: 1 10*3/uL (ref 1.0–3.6)
Lymphocyte %: 21.4 %
MCH: 25.3 pg — AB (ref 26.0–34.0)
MCHC: 30.9 g/dL — ABNORMAL LOW (ref 32.0–36.0)
MCV: 82 fL (ref 80–100)
MONO ABS: 0.7 x10 3/mm (ref 0.2–1.0)
MONOS PCT: 15.1 %
Neutrophil #: 3 10*3/uL (ref 1.4–6.5)
Neutrophil %: 60.7 %
PLATELETS: 107 10*3/uL — AB (ref 150–440)
RBC: 2.68 10*6/uL — AB (ref 4.40–5.90)
RDW: 20.1 % — ABNORMAL HIGH (ref 11.5–14.5)
WBC: 4.9 10*3/uL (ref 3.8–10.6)

## 2013-06-24 LAB — PHOSPHORUS: Phosphorus: 5 mg/dL — ABNORMAL HIGH (ref 2.5–4.9)

## 2013-06-24 LAB — HEMOGLOBIN: HGB: 7.9 g/dL — ABNORMAL LOW (ref 13.0–18.0)

## 2013-06-25 LAB — CBC WITH DIFFERENTIAL/PLATELET
Basophil #: 0 10*3/uL (ref 0.0–0.1)
Basophil %: 0.7 %
EOS PCT: 1.1 %
Eosinophil #: 0 10*3/uL (ref 0.0–0.7)
HCT: 24.4 % — ABNORMAL LOW (ref 40.0–52.0)
HGB: 7.4 g/dL — AB (ref 13.0–18.0)
LYMPHS ABS: 0.9 10*3/uL — AB (ref 1.0–3.6)
Lymphocyte %: 19.5 %
MCH: 25.4 pg — ABNORMAL LOW (ref 26.0–34.0)
MCHC: 30.5 g/dL — ABNORMAL LOW (ref 32.0–36.0)
MCV: 83 fL (ref 80–100)
Monocyte #: 0.7 x10 3/mm (ref 0.2–1.0)
Monocyte %: 15.2 %
Neutrophil #: 2.8 10*3/uL (ref 1.4–6.5)
Neutrophil %: 63.5 %
PLATELETS: 97 10*3/uL — AB (ref 150–440)
RBC: 2.92 10*6/uL — ABNORMAL LOW (ref 4.40–5.90)
RDW: 20.3 % — ABNORMAL HIGH (ref 11.5–14.5)
WBC: 4.4 10*3/uL (ref 3.8–10.6)

## 2013-06-25 LAB — BASIC METABOLIC PANEL
Anion Gap: 8 (ref 7–16)
BUN: 22 mg/dL — AB (ref 7–18)
CO2: 31 mmol/L (ref 21–32)
Calcium, Total: 8.9 mg/dL (ref 8.5–10.1)
Chloride: 101 mmol/L (ref 98–107)
Creatinine: 4.98 mg/dL — ABNORMAL HIGH (ref 0.60–1.30)
EGFR (African American): 12 — ABNORMAL LOW
EGFR (Non-African Amer.): 11 — ABNORMAL LOW
Glucose: 84 mg/dL (ref 65–99)
OSMOLALITY: 282 (ref 275–301)
Potassium: 3.9 mmol/L (ref 3.5–5.1)
Sodium: 140 mmol/L (ref 136–145)

## 2013-06-26 LAB — WOUND CULTURE

## 2013-06-26 LAB — HEMOGLOBIN: HGB: 7.9 g/dL — ABNORMAL LOW (ref 13.0–18.0)

## 2013-06-27 LAB — RENAL FUNCTION PANEL
Albumin: 2.8 g/dL — ABNORMAL LOW (ref 3.4–5.0)
Anion Gap: 8 (ref 7–16)
BUN: 35 mg/dL — ABNORMAL HIGH (ref 7–18)
Calcium, Total: 8.9 mg/dL (ref 8.5–10.1)
Chloride: 98 mmol/L (ref 98–107)
Co2: 29 mmol/L (ref 21–32)
Creatinine: 8.29 mg/dL — ABNORMAL HIGH (ref 0.60–1.30)
EGFR (African American): 7 — ABNORMAL LOW
EGFR (Non-African Amer.): 6 — ABNORMAL LOW
Glucose: 80 mg/dL (ref 65–99)
Osmolality: 277 (ref 275–301)
POTASSIUM: 4.1 mmol/L (ref 3.5–5.1)
Phosphorus: 6.4 mg/dL — ABNORMAL HIGH (ref 2.5–4.9)
SODIUM: 135 mmol/L — AB (ref 136–145)

## 2013-06-27 LAB — CULTURE, BLOOD (SINGLE)

## 2013-06-27 LAB — HEMOGLOBIN: HGB: 7.6 g/dL — AB (ref 13.0–18.0)

## 2013-06-27 LAB — PHOSPHORUS: PHOSPHORUS: 6.7 mg/dL — AB (ref 2.5–4.9)

## 2013-06-27 LAB — CLOSTRIDIUM DIFFICILE(ARMC)

## 2013-06-28 LAB — CBC WITH DIFFERENTIAL/PLATELET
Basophil #: 0 10*3/uL (ref 0.0–0.1)
Basophil %: 1.1 %
Eosinophil #: 0.1 10*3/uL (ref 0.0–0.7)
Eosinophil %: 2.1 %
HCT: 27.6 % — AB (ref 40.0–52.0)
HGB: 8.4 g/dL — ABNORMAL LOW (ref 13.0–18.0)
LYMPHS PCT: 26.5 %
Lymphocyte #: 0.9 10*3/uL — ABNORMAL LOW (ref 1.0–3.6)
MCH: 25.6 pg — ABNORMAL LOW (ref 26.0–34.0)
MCHC: 30.5 g/dL — ABNORMAL LOW (ref 32.0–36.0)
MCV: 84 fL (ref 80–100)
MONO ABS: 0.5 x10 3/mm (ref 0.2–1.0)
MONOS PCT: 15.3 %
Neutrophil #: 1.9 10*3/uL (ref 1.4–6.5)
Neutrophil %: 55 %
Platelet: 74 10*3/uL — ABNORMAL LOW (ref 150–440)
RBC: 3.29 10*6/uL — AB (ref 4.40–5.90)
RDW: 19.1 % — ABNORMAL HIGH (ref 11.5–14.5)
WBC: 3.4 10*3/uL — ABNORMAL LOW (ref 3.8–10.6)

## 2013-06-28 LAB — HEMOGLOBIN: HGB: 8.6 g/dL — AB (ref 13.0–18.0)

## 2013-06-28 LAB — PROTIME-INR
INR: 1.6
Prothrombin Time: 18.8 secs — ABNORMAL HIGH (ref 11.5–14.7)

## 2013-06-29 LAB — CBC WITH DIFFERENTIAL/PLATELET
BASOS ABS: 0 10*3/uL (ref 0.0–0.1)
Basophil %: 0.6 %
EOS ABS: 0.1 10*3/uL (ref 0.0–0.7)
Eosinophil %: 2.3 %
HCT: 26.9 % — ABNORMAL LOW (ref 40.0–52.0)
HGB: 8.4 g/dL — ABNORMAL LOW (ref 13.0–18.0)
LYMPHS ABS: 0.9 10*3/uL — AB (ref 1.0–3.6)
Lymphocyte %: 22.7 %
MCH: 26.3 pg (ref 26.0–34.0)
MCHC: 31.3 g/dL — ABNORMAL LOW (ref 32.0–36.0)
MCV: 84 fL (ref 80–100)
MONO ABS: 0.7 x10 3/mm (ref 0.2–1.0)
MONOS PCT: 17.9 %
NEUTROS ABS: 2.1 10*3/uL (ref 1.4–6.5)
Neutrophil %: 56.5 %
Platelet: 61 10*3/uL — ABNORMAL LOW (ref 150–440)
RBC: 3.2 10*6/uL — ABNORMAL LOW (ref 4.40–5.90)
RDW: 18.7 % — ABNORMAL HIGH (ref 11.5–14.5)
WBC: 3.8 10*3/uL (ref 3.8–10.6)

## 2013-06-29 LAB — PROTIME-INR
INR: 1.6
Prothrombin Time: 18.3 secs — ABNORMAL HIGH (ref 11.5–14.7)

## 2013-06-29 LAB — PHOSPHORUS: Phosphorus: 4.7 mg/dL (ref 2.5–4.9)

## 2013-06-30 LAB — BASIC METABOLIC PANEL
Anion Gap: 2 — ABNORMAL LOW (ref 7–16)
BUN: 16 mg/dL (ref 7–18)
CALCIUM: 9.1 mg/dL (ref 8.5–10.1)
CHLORIDE: 98 mmol/L (ref 98–107)
CO2: 35 mmol/L — AB (ref 21–32)
CREATININE: 6.31 mg/dL — AB (ref 0.60–1.30)
EGFR (African American): 9 — ABNORMAL LOW
EGFR (Non-African Amer.): 8 — ABNORMAL LOW
Glucose: 91 mg/dL (ref 65–99)
Osmolality: 271 (ref 275–301)
Potassium: 3.8 mmol/L (ref 3.5–5.1)
Sodium: 135 mmol/L — ABNORMAL LOW (ref 136–145)

## 2013-06-30 LAB — CBC WITH DIFFERENTIAL/PLATELET
Basophil #: 0 10*3/uL (ref 0.0–0.1)
Basophil %: 0.4 %
EOS ABS: 0.1 10*3/uL (ref 0.0–0.7)
Eosinophil %: 1.3 %
HCT: 26.8 % — AB (ref 40.0–52.0)
HGB: 8.4 g/dL — ABNORMAL LOW (ref 13.0–18.0)
LYMPHS ABS: 0.9 10*3/uL — AB (ref 1.0–3.6)
Lymphocyte %: 22.3 %
MCH: 26.3 pg (ref 26.0–34.0)
MCHC: 31.3 g/dL — AB (ref 32.0–36.0)
MCV: 84 fL (ref 80–100)
Monocyte #: 0.8 x10 3/mm (ref 0.2–1.0)
Monocyte %: 18.9 %
NEUTROS PCT: 57.1 %
Neutrophil #: 2.4 10*3/uL (ref 1.4–6.5)
Platelet: 56 10*3/uL — ABNORMAL LOW (ref 150–440)
RBC: 3.19 10*6/uL — ABNORMAL LOW (ref 4.40–5.90)
RDW: 19.2 % — ABNORMAL HIGH (ref 11.5–14.5)
WBC: 4.2 10*3/uL (ref 3.8–10.6)

## 2013-06-30 LAB — PROTIME-INR
INR: 1.6
PROTHROMBIN TIME: 19.1 s — AB (ref 11.5–14.7)

## 2013-07-01 LAB — CBC WITH DIFFERENTIAL/PLATELET
BASOS ABS: 0 10*3/uL (ref 0.0–0.1)
Basophil %: 0.5 %
EOS PCT: 3.2 %
Eosinophil #: 0.1 10*3/uL (ref 0.0–0.7)
HCT: 27.3 % — ABNORMAL LOW (ref 40.0–52.0)
HGB: 8.4 g/dL — AB (ref 13.0–18.0)
LYMPHS ABS: 0.9 10*3/uL — AB (ref 1.0–3.6)
Lymphocyte %: 19.6 %
MCH: 25.7 pg — AB (ref 26.0–34.0)
MCHC: 30.8 g/dL — ABNORMAL LOW (ref 32.0–36.0)
MCV: 84 fL (ref 80–100)
MONO ABS: 0.6 x10 3/mm (ref 0.2–1.0)
Monocyte %: 14.1 %
NEUTROS ABS: 2.9 10*3/uL (ref 1.4–6.5)
Neutrophil %: 62.6 %
PLATELETS: 57 10*3/uL — AB (ref 150–440)
RBC: 3.26 10*6/uL — AB (ref 4.40–5.90)
RDW: 19 % — AB (ref 11.5–14.5)
WBC: 4.6 10*3/uL (ref 3.8–10.6)

## 2013-07-01 LAB — HEPATIC FUNCTION PANEL A (ARMC)
ALBUMIN: 3 g/dL — AB (ref 3.4–5.0)
ALT: 9 U/L — AB (ref 12–78)
Alkaline Phosphatase: 111 U/L
BILIRUBIN TOTAL: 0.8 mg/dL (ref 0.2–1.0)
Bilirubin, Direct: 0.3 mg/dL — ABNORMAL HIGH (ref 0.00–0.20)
SGOT(AST): 10 U/L — ABNORMAL LOW (ref 15–37)
Total Protein: 6.7 g/dL (ref 6.4–8.2)

## 2013-07-01 LAB — IRON AND TIBC
IRON BIND. CAP.(TOTAL): 247 ug/dL — AB (ref 250–450)
IRON SATURATION: 8 %
IRON: 19 ug/dL — AB (ref 65–175)
Unbound Iron-Bind.Cap.: 228 ug/dL

## 2013-07-01 LAB — BASIC METABOLIC PANEL
ANION GAP: 7 (ref 7–16)
BUN: 24 mg/dL — AB (ref 7–18)
CHLORIDE: 97 mmol/L — AB (ref 98–107)
Calcium, Total: 8.7 mg/dL (ref 8.5–10.1)
Co2: 32 mmol/L (ref 21–32)
Creatinine: 8.03 mg/dL — ABNORMAL HIGH (ref 0.60–1.30)
EGFR (Non-African Amer.): 6 — ABNORMAL LOW
GFR CALC AF AMER: 7 — AB
GLUCOSE: 98 mg/dL (ref 65–99)
Osmolality: 276 (ref 275–301)
POTASSIUM: 3.8 mmol/L (ref 3.5–5.1)
Sodium: 136 mmol/L (ref 136–145)

## 2013-07-01 LAB — FERRITIN: Ferritin (ARMC): 47 ng/mL (ref 8–388)

## 2013-07-01 LAB — PHOSPHORUS: PHOSPHORUS: 5.3 mg/dL — AB (ref 2.5–4.9)

## 2013-07-01 LAB — RETICULOCYTES
ABSOLUTE RETIC COUNT: 0.0386 10*6/uL (ref 0.019–0.186)
RETICULOCYTE: 1.18 % (ref 0.4–3.1)

## 2013-07-05 LAB — PATHOLOGY REPORT

## 2013-07-06 ENCOUNTER — Ambulatory Visit: Payer: Self-pay | Admitting: Internal Medicine

## 2013-07-07 LAB — CANCER CENTER HEMOGLOBIN: HGB: 8.2 g/dL — ABNORMAL LOW (ref 13.0–18.0)

## 2013-07-14 LAB — CBC CANCER CENTER
Basophil #: 0 x10 3/mm (ref 0.0–0.1)
Basophil %: 0.3 %
EOS ABS: 0.2 x10 3/mm (ref 0.0–0.7)
Eosinophil %: 3 %
HCT: 28.2 % — AB (ref 40.0–52.0)
HGB: 8.5 g/dL — AB (ref 13.0–18.0)
LYMPHS PCT: 21 %
Lymphocyte #: 1.1 x10 3/mm (ref 1.0–3.6)
MCH: 25.2 pg — ABNORMAL LOW (ref 26.0–34.0)
MCHC: 30 g/dL — ABNORMAL LOW (ref 32.0–36.0)
MCV: 84 fL (ref 80–100)
MONOS PCT: 14.6 %
Monocyte #: 0.8 x10 3/mm (ref 0.2–1.0)
NEUTROS ABS: 3.2 x10 3/mm (ref 1.4–6.5)
Neutrophil %: 61.1 %
Platelet: 113 x10 3/mm — ABNORMAL LOW (ref 150–440)
RBC: 3.37 10*6/uL — ABNORMAL LOW (ref 4.40–5.90)
RDW: 19.4 % — ABNORMAL HIGH (ref 11.5–14.5)
WBC: 5.2 x10 3/mm (ref 3.8–10.6)

## 2013-07-31 ENCOUNTER — Ambulatory Visit: Payer: Self-pay | Admitting: Internal Medicine

## 2013-08-04 ENCOUNTER — Ambulatory Visit: Payer: Self-pay | Admitting: Internal Medicine

## 2013-08-04 LAB — FERRITIN: Ferritin (ARMC): 194 ng/mL (ref 8–388)

## 2013-08-04 LAB — IRON AND TIBC
IRON BIND. CAP.(TOTAL): 211 ug/dL — AB (ref 250–450)
IRON: 29 ug/dL — AB (ref 65–175)
Iron Saturation: 14 %
UNBOUND IRON-BIND. CAP.: 182 ug/dL

## 2013-08-04 LAB — CANCER CENTER HEMOGLOBIN: HGB: 8.5 g/dL — ABNORMAL LOW (ref 13.0–18.0)

## 2013-08-23 ENCOUNTER — Other Ambulatory Visit: Payer: Self-pay | Admitting: Podiatry

## 2013-08-30 ENCOUNTER — Encounter: Payer: Self-pay | Admitting: Surgery

## 2013-08-30 LAB — WOUND CULTURE

## 2013-08-31 ENCOUNTER — Ambulatory Visit: Payer: Self-pay | Admitting: Internal Medicine

## 2013-09-29 LAB — CBC CANCER CENTER
BASOS ABS: 0 x10 3/mm (ref 0.0–0.1)
BASOS PCT: 0.9 %
EOS ABS: 0.1 x10 3/mm (ref 0.0–0.7)
Eosinophil %: 3.6 %
HCT: 26.3 % — AB (ref 40.0–52.0)
HGB: 8.4 g/dL — AB (ref 13.0–18.0)
Lymphocyte #: 0.7 x10 3/mm — ABNORMAL LOW (ref 1.0–3.6)
Lymphocyte %: 26 %
MCH: 26.5 pg (ref 26.0–34.0)
MCHC: 32.1 g/dL (ref 32.0–36.0)
MCV: 83 fL (ref 80–100)
MONO ABS: 0.5 x10 3/mm (ref 0.2–1.0)
MONOS PCT: 18.7 %
Neutrophil #: 1.4 x10 3/mm (ref 1.4–6.5)
Neutrophil %: 50.8 %
Platelet: 70 x10 3/mm — ABNORMAL LOW (ref 150–440)
RBC: 3.18 10*6/uL — AB (ref 4.40–5.90)
RDW: 17 % — ABNORMAL HIGH (ref 11.5–14.5)
WBC: 2.8 x10 3/mm — ABNORMAL LOW (ref 3.8–10.6)

## 2013-09-29 LAB — PROTIME-INR
INR: 1.5
Prothrombin Time: 17.7 secs — ABNORMAL HIGH (ref 11.5–14.7)

## 2013-09-30 ENCOUNTER — Ambulatory Visit: Payer: Self-pay | Admitting: Internal Medicine

## 2013-10-25 ENCOUNTER — Ambulatory Visit: Payer: Self-pay | Admitting: Podiatry

## 2013-10-25 LAB — CBC WITH DIFFERENTIAL/PLATELET
Basophil #: 0 10*3/uL (ref 0.0–0.1)
Basophil %: 0.6 %
EOS ABS: 0.1 10*3/uL (ref 0.0–0.7)
Eosinophil %: 3.3 %
HCT: 31.5 % — AB (ref 40.0–52.0)
HGB: 9.7 g/dL — AB (ref 13.0–18.0)
LYMPHS PCT: 26.2 %
Lymphocyte #: 0.8 10*3/uL — ABNORMAL LOW (ref 1.0–3.6)
MCH: 26 pg (ref 26.0–34.0)
MCHC: 30.8 g/dL — AB (ref 32.0–36.0)
MCV: 84 fL (ref 80–100)
MONOS PCT: 16.2 %
Monocyte #: 0.5 x10 3/mm (ref 0.2–1.0)
NEUTROS ABS: 1.7 10*3/uL (ref 1.4–6.5)
Neutrophil %: 53.7 %
Platelet: 55 10*3/uL — ABNORMAL LOW (ref 150–440)
RBC: 3.73 10*6/uL — ABNORMAL LOW (ref 4.40–5.90)
RDW: 17 % — ABNORMAL HIGH (ref 11.5–14.5)
WBC: 3.1 10*3/uL — ABNORMAL LOW (ref 3.8–10.6)

## 2013-10-27 ENCOUNTER — Ambulatory Visit (INDEPENDENT_AMBULATORY_CARE_PROVIDER_SITE_OTHER): Payer: Medicare Other | Admitting: Internal Medicine

## 2013-10-27 ENCOUNTER — Encounter: Payer: Self-pay | Admitting: Internal Medicine

## 2013-10-27 VITALS — BP 110/42 | HR 68 | Ht 72.0 in | Wt 222.0 lb

## 2013-10-27 DIAGNOSIS — I4891 Unspecified atrial fibrillation: Secondary | ICD-10-CM

## 2013-10-27 LAB — CBC CANCER CENTER
BASOS PCT: 0.4 %
Basophil #: 0 x10 3/mm (ref 0.0–0.1)
Eosinophil #: 0.1 x10 3/mm (ref 0.0–0.7)
Eosinophil %: 2.6 %
HCT: 28.3 % — ABNORMAL LOW (ref 40.0–52.0)
HGB: 9.1 g/dL — ABNORMAL LOW (ref 13.0–18.0)
Lymphocyte #: 0.9 x10 3/mm — ABNORMAL LOW (ref 1.0–3.6)
Lymphocyte %: 25.7 %
MCH: 26.5 pg (ref 26.0–34.0)
MCHC: 32.1 g/dL (ref 32.0–36.0)
MCV: 83 fL (ref 80–100)
MONO ABS: 0.6 x10 3/mm (ref 0.2–1.0)
Monocyte %: 16.2 %
NEUTROS ABS: 2 x10 3/mm (ref 1.4–6.5)
Neutrophil %: 55.1 %
Platelet: 65 x10 3/mm — ABNORMAL LOW (ref 150–440)
RBC: 3.42 10*6/uL — ABNORMAL LOW (ref 4.40–5.90)
RDW: 16.7 % — ABNORMAL HIGH (ref 11.5–14.5)
WBC: 3.5 x10 3/mm — ABNORMAL LOW (ref 3.8–10.6)

## 2013-10-27 NOTE — Patient Instructions (Signed)
Your physician recommends that you continue on your current medications as directed. Please refer to the Current Medication list given to you today.  Your physician wants you to follow-up in: 6 months. You will receive a reminder letter in the mail two months in advance. If you don't receive a letter, please call our office to schedule the follow-up appointment.  

## 2013-10-27 NOTE — Progress Notes (Signed)
Patient Care Team: Casilda Carls, MD as PCP - General (Internal Medicine)   HPI  Robert [name] is a 73 y.o. 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 He has evolved permanent atrial fibrillation w controlled VR    He underwent pacemaker generator replacement 9/13;complicated by infection and subsequent extraction. Given the progression of his atrial fibrillation>>permanent and  the elimination of his sinus node dysfunction,  device implantation was not done   He is not on anticoagulants.  The discontinuation was confirmed following discussions with their oncologist  He has significant progression in his disability.  This is been associated with problem hours. He has also had a significant bleed with a hemoglobin down to 4 . 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 He anticipates cataract surgery.  He has had hypotension. ProAmatine has been started. It has been prescribed once a day   his wife is fairly discouraged particularly at poor physician communication  Past Medical History  Diagnosis Date  . CAD (coronary artery disease)     EF 40% 2011 CABG 2004  . PVD (peripheral vascular disease)   . Carotid artery disease   . Diverticulitis   . Complication of anesthesia     hard to wake up  . Hypotension     takes Midodrine only on dialysis days  . Sinus node dysfunction     noted following Afluttter RFCA-takes Proscar daily  . Atrial fibrillation     S/P AFlutter RFCA 2006  . Shortness of breath     tessalon to help with this and cough  . Asthma     Albuterol bid  . Pneumonia     hx of;last time Jan 2014  . History of bronchitis   . Parkinson's disease   . Confusion   . Short-term memory loss   . H/O multiple concussions     x 3 as a child  . Peripheral neuropathy   . Arthritis   . Joint pain   . Chronic back pain     DDD  . Joint  swelling   . Bruises easily   . Thin skin   . H/O hiatal hernia   . GERD (gastroesophageal reflux disease)     takes Omeprazole daily  . History of colon polyps   . ESRD (end stage renal disease)     hemodialysis/M/W/F-[facility]  . Anemia   . History of blood transfusion     no abnormal reaction noted  . Pacemaker     Medtronic is rep  . Hypothyroidism     takes Synthroid daily-no thyroid  . Diabetes mellitus without complication     novolog flexpen prn  . Cataracts, bilateral     scheduled surgery already  . Depression     doesn't take any meds  . Insomnia     nothing works per wife  . Low hemoglobin     Past Surgical History  Procedure Laterality Date  . Tonsillectomy    . Bladder surgery    . Pacemaker insertion  1/24/6    Medtronic N-rhythm P1501DR  . Insert / replace / remove pacemaker      Medtronic  . Femoral endarterectomy      left lower  . Toe amputation    . I&d of left foot 2nd toe     . Wound vac placed    . Coronary  artery bypass graft      x 5  . Tonsillectomy    . Adenoidectomy    . Cholecystectomy    . Cervical fusion    . Coronary angioplasty      several but unsure how many  . Colonoscopy    . Radiation to thyroid    . Pacemaker lead removal Left 02/10/2013    Procedure: PACEMAKER LEAD REMOVAL;  Surgeon: Evans Lance, MD;  Location: [facility];  Service: Cardiovascular;  Laterality: Left;  . Cataract extraction Left   . Toe amputation Right     Current Outpatient Prescriptions  Medication Sig Dispense Refill  . albuterol (PROVENTIL HFA) 108 (90 BASE) MCG/ACT inhaler Inhale 2 puffs into the lungs every 6 (six) hours as needed for wheezing.       Marland Kitchen amoxicillin-clavulanate (AUGMENTIN) 500-125 MG per tablet Take 1 tablet by mouth 2 (two) times daily.       . carisoprodol (SOMA) 350 MG tablet Take 350 mg by mouth daily.       . ciprofloxacin (CIPRO) 500 MG tablet Take 500 mg by mouth 2 (two) times daily.      Marland Kitchen epoetin alfa  (PROCRIT) 3000 UNIT/ML injection Inject 6,000 Units into the skin 2 (two) times a week.        . finasteride (PROSCAR) 5 MG tablet Take 5 mg by mouth once a week.       . gabapentin (NEURONTIN) 100 MG capsule Take 100 mg by mouth 3 (three) times daily.       . insulin aspart (NOVOLOG FLEXPEN) 100 UNIT/ML injection Inject 4 Units into the skin 3 (three) times daily as needed for high blood sugar.      . levothyroxine (SYNTHROID, LEVOTHROID) 200 MCG tablet Take 200 mcg by mouth daily before breakfast.      . midodrine (PROAMATINE) 10 MG tablet Take 10 mg by mouth daily.      . multivitamin (RENA-VIT) TABS tablet Take 1 tablet by mouth daily.      Marland Kitchen NITROSTAT 0.4 MG SL tablet Place 0.4 mg under the tongue every 5 (five) minutes as needed for chest pain.       . NON FORMULARY [place]ru[place] takes daily.      Marland Kitchen omeprazole (PRILOSEC) 40 MG capsule Take 40 mg by mouth daily.        . Oxycodone HCl 10 MG TABS Take by mouth 6 (six) times daily.      . pantoprazole (PROTONIX) 20 MG tablet Take 20 mg by mouth daily.      . polyethylene glycol (MIRALAX / GLYCOLAX) packet Take 17 g by mouth 4 (four) times a week.      . Probiotic Product ([place]) Take by mouth daily.      . ropinirole (REQUIP) 5 MG tablet Take 5 mg by mouth 3 (three) times daily.        No current facility-administered medications for this visit.    Allergies  Allergen Reactions  . Vancomycin Shortness Of Breath  . Celebrex [Celecoxib]     Shortness of breath  . Ciprofloxacin Itching  . Dilaudid [Hydromorphone Hcl]     Suicidal thoughts  . Morphine Itching  . Niacin Hives    Review of Systems negative except from HPI and PMH  Physical Exam BP 110/42  Pulse 68  Ht 6' (1.829 m)  Wt 222 lb (100.699 kg)  BMI 30.10 kg/m2 Well developed and nourished not very comfortable HENT normal Neck  supple  lat Clear Irregularly irregularr rate and rhythm, 2/6 systolic murmur Abd-soft with active BS No Clubbing cyanosis  1-2+ and symmetric edema Skin-warm and dry A & Oriented  Sitting in a wheelchair unable to stand  ECG demonstrates atrial fibrillation with controlled ventricular response with right bundle left posterior fascicular block rate is 68 Intervals-/19/43 Axis 144   Assessment and  Plan  Atrial fibrillation-permanent  Orthostatic hypotension  End-stage renal disease   on hemodialysis  Chronic osteomyelitis  He is doing poorly. A couple of things come to mind.  1. ProAmatine as frequently used 3-4 times a day. I would encourage him to talk with his other physicians regarding its use, potentially 5 mg pre-4 times daily. Would also be important to get input from vascular surgery as to its potential impact on his peripheral vascular disease  2. An abdominal binder may well help also with orthostasis. I don't think would have an impact on flow in and out of his lower extremities but this could also be cleared with his peripheral vascular physician team.  3. I have seen a number of patients for whom chronic smoldering osteomyelitis has been responsible for general malaise and doing poorly and more aggressive amputation has been significantly beneficial in improving symptoms.  I will defer this to people with great more expertise and I

## 2013-10-31 ENCOUNTER — Ambulatory Visit: Payer: Self-pay | Admitting: Internal Medicine

## 2013-11-01 ENCOUNTER — Ambulatory Visit: Payer: Self-pay | Admitting: Podiatry

## 2013-11-01 ENCOUNTER — Telehealth: Payer: Self-pay | Admitting: Internal Medicine

## 2013-11-01 NOTE — Telephone Encounter (Signed)
Dr. Rosario Jacks called and wanted to know if patient's heart is strong enough for foot surgery? Please call and advise.

## 2013-11-03 NOTE — Telephone Encounter (Signed)
Dr. Caryl Comes spoke with Dr. Rosario Jacks yesterday, 6/3, about this.

## 2013-11-05 LAB — PATHOLOGY REPORT

## 2013-11-14 ENCOUNTER — Ambulatory Visit: Payer: Self-pay | Admitting: Ophthalmology

## 2013-11-23 ENCOUNTER — Emergency Department: Payer: Self-pay | Admitting: Emergency Medicine

## 2013-11-23 LAB — LIPASE, BLOOD: LIPASE: 101 U/L (ref 73–393)

## 2013-11-23 LAB — COMPREHENSIVE METABOLIC PANEL
ALT: 9 U/L — AB (ref 12–78)
Albumin: 3.3 g/dL — ABNORMAL LOW (ref 3.4–5.0)
Alkaline Phosphatase: 188 U/L — ABNORMAL HIGH
Anion Gap: 6 — ABNORMAL LOW (ref 7–16)
BUN: 20 mg/dL — ABNORMAL HIGH (ref 7–18)
Bilirubin,Total: 0.6 mg/dL (ref 0.2–1.0)
Calcium, Total: 10 mg/dL (ref 8.5–10.1)
Chloride: 103 mmol/L (ref 98–107)
Co2: 27 mmol/L (ref 21–32)
Creatinine: 3.7 mg/dL — ABNORMAL HIGH (ref 0.60–1.30)
EGFR (African American): 17 — ABNORMAL LOW
EGFR (Non-African Amer.): 15 — ABNORMAL LOW
GLUCOSE: 99 mg/dL (ref 65–99)
OSMOLALITY: 275 (ref 275–301)
Potassium: 5 mmol/L (ref 3.5–5.1)
SGOT(AST): 29 U/L (ref 15–37)
Sodium: 136 mmol/L (ref 136–145)
Total Protein: 7.7 g/dL (ref 6.4–8.2)

## 2013-11-23 LAB — CBC
HCT: 29 % — AB (ref 40.0–52.0)
HGB: 8.9 g/dL — AB (ref 13.0–18.0)
MCH: 25.5 pg — ABNORMAL LOW (ref 26.0–34.0)
MCHC: 30.8 g/dL — AB (ref 32.0–36.0)
MCV: 83 fL (ref 80–100)
Platelet: 73 10*3/uL — ABNORMAL LOW (ref 150–440)
RBC: 3.49 10*6/uL — AB (ref 4.40–5.90)
RDW: 16.4 % — ABNORMAL HIGH (ref 11.5–14.5)
WBC: 3.1 10*3/uL — AB (ref 3.8–10.6)

## 2013-12-07 ENCOUNTER — Ambulatory Visit: Payer: Self-pay | Admitting: Internal Medicine

## 2013-12-13 ENCOUNTER — Ambulatory Visit: Payer: Self-pay | Admitting: Internal Medicine

## 2013-12-14 LAB — CEA: CEA: 3.4 ng/mL (ref 0.0–4.7)

## 2013-12-20 ENCOUNTER — Ambulatory Visit: Payer: Self-pay | Admitting: Vascular Surgery

## 2013-12-22 ENCOUNTER — Ambulatory Visit: Payer: Self-pay | Admitting: Internal Medicine

## 2013-12-31 ENCOUNTER — Ambulatory Visit: Payer: Self-pay | Admitting: Internal Medicine

## 2014-01-24 ENCOUNTER — Ambulatory Visit: Payer: Self-pay | Admitting: Internal Medicine

## 2014-01-24 LAB — CBC CANCER CENTER
Basophil #: 0 x10 3/mm (ref 0.0–0.1)
Basophil %: 0.6 %
Eosinophil #: 0.1 x10 3/mm (ref 0.0–0.7)
Eosinophil %: 3 %
HCT: 30 % — ABNORMAL LOW (ref 40.0–52.0)
HGB: 9.3 g/dL — ABNORMAL LOW (ref 13.0–18.0)
LYMPHS ABS: 0.8 x10 3/mm — AB (ref 1.0–3.6)
LYMPHS PCT: 22.7 %
MCH: 25.3 pg — ABNORMAL LOW (ref 26.0–34.0)
MCHC: 31 g/dL — ABNORMAL LOW (ref 32.0–36.0)
MCV: 82 fL (ref 80–100)
MONOS PCT: 15.4 %
Monocyte #: 0.5 x10 3/mm (ref 0.2–1.0)
NEUTROS ABS: 1.9 x10 3/mm (ref 1.4–6.5)
Neutrophil %: 58.3 %
Platelet: 70 x10 3/mm — ABNORMAL LOW (ref 150–440)
RBC: 3.67 10*6/uL — ABNORMAL LOW (ref 4.40–5.90)
RDW: 19 % — ABNORMAL HIGH (ref 11.5–14.5)
WBC: 3.3 x10 3/mm — ABNORMAL LOW (ref 3.8–10.6)

## 2014-01-30 ENCOUNTER — Ambulatory Visit: Payer: Self-pay | Admitting: Internal Medicine

## 2014-01-31 ENCOUNTER — Ambulatory Visit: Payer: Self-pay | Admitting: Internal Medicine

## 2014-02-09 ENCOUNTER — Ambulatory Visit: Payer: Self-pay | Admitting: Internal Medicine

## 2014-02-09 LAB — CBC WITH DIFFERENTIAL/PLATELET
Basophil #: 0 10*3/uL (ref 0.0–0.1)
Basophil %: 0.8 %
EOS PCT: 3 %
Eosinophil #: 0.1 10*3/uL (ref 0.0–0.7)
HCT: 30.5 % — ABNORMAL LOW (ref 40.0–52.0)
HGB: 9.4 g/dL — ABNORMAL LOW (ref 13.0–18.0)
LYMPHS ABS: 0.7 10*3/uL — AB (ref 1.0–3.6)
Lymphocyte %: 23.1 %
MCH: 25.7 pg — AB (ref 26.0–34.0)
MCHC: 30.9 g/dL — ABNORMAL LOW (ref 32.0–36.0)
MCV: 83 fL (ref 80–100)
MONOS PCT: 17.8 %
Monocyte #: 0.5 x10 3/mm (ref 0.2–1.0)
NEUTROS ABS: 1.6 10*3/uL (ref 1.4–6.5)
Neutrophil %: 55.3 %
PLATELETS: 76 10*3/uL — AB (ref 150–440)
RBC: 3.66 10*6/uL — AB (ref 4.40–5.90)
RDW: 19.1 % — AB (ref 11.5–14.5)
WBC: 2.9 10*3/uL — AB (ref 3.8–10.6)

## 2014-02-09 LAB — PROTIME-INR
INR: 1.4
Prothrombin Time: 17.2 secs — ABNORMAL HIGH (ref 11.5–14.7)

## 2014-02-09 LAB — APTT: ACTIVATED PTT: 41.7 s — AB (ref 23.6–35.9)

## 2014-02-20 IMAGING — US US EXTREM LOW VENOUS*L*
1 series · 13 of 24 positions shown · non-contrast
Comparison: none

REASON FOR EXAM: edema
COMMENTS:

PROCEDURE:     US  - US DOPPLER LOW EXTR LEFT  - July 21, 2012 [DATE]
RESULT:     Comparison: None

[Series 1: us extrem low venous*left* · 0.14mm/px · 13 of 37 slices shown]
[im 1/37]
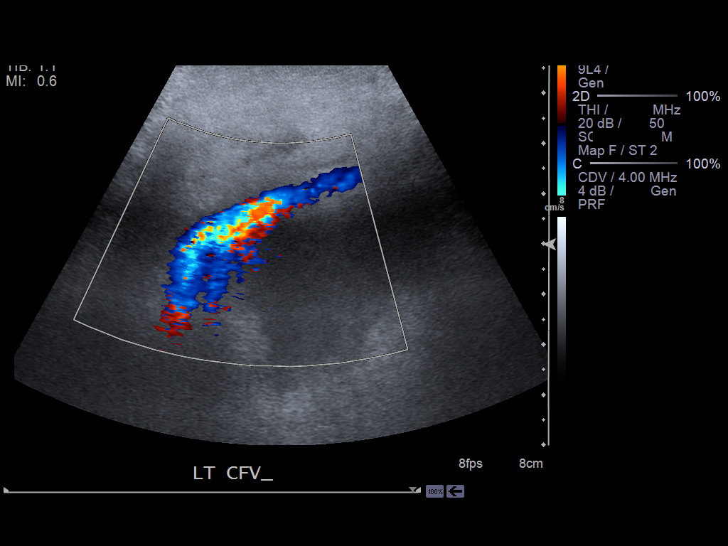
[im 4/37]
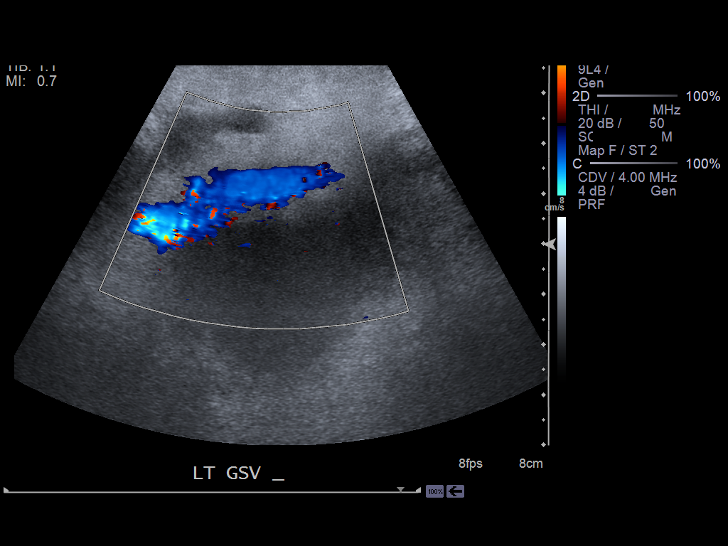
[im 7/37]
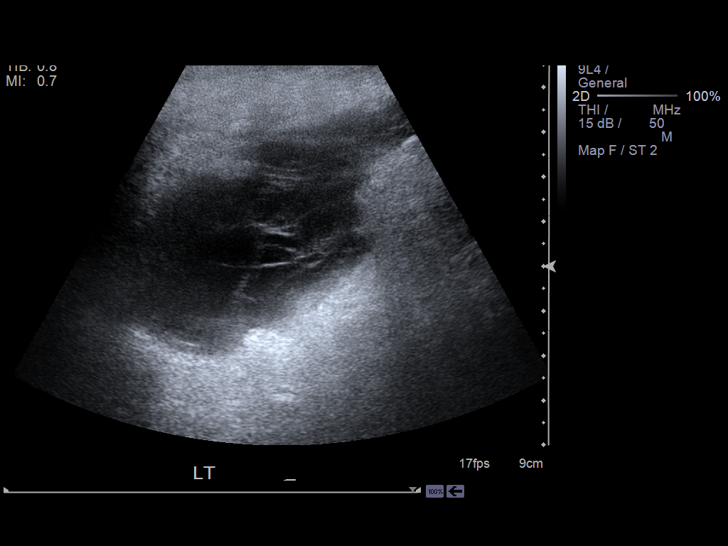
[im 10/37]
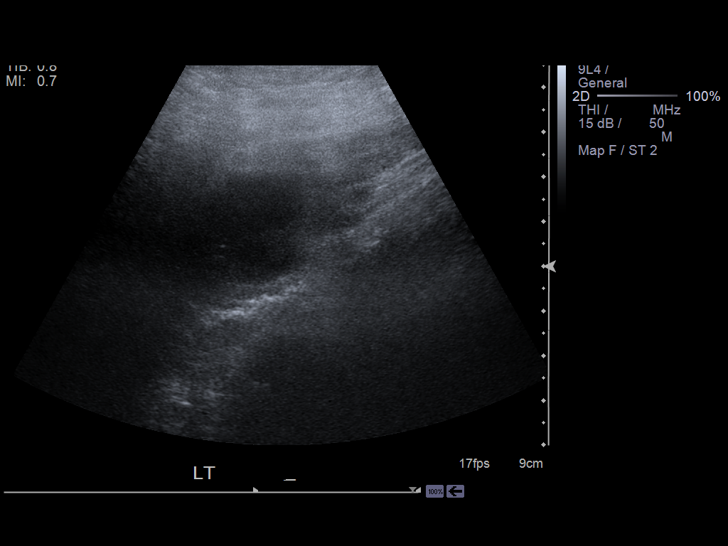
[im 13/37]
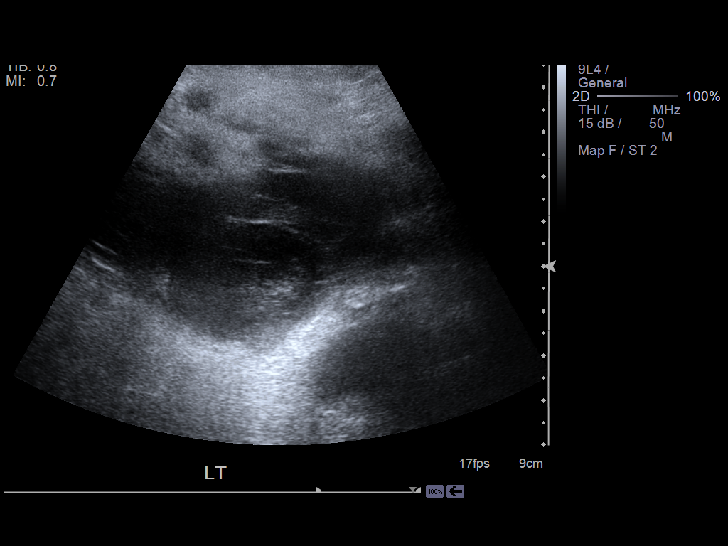
[im 16/37]
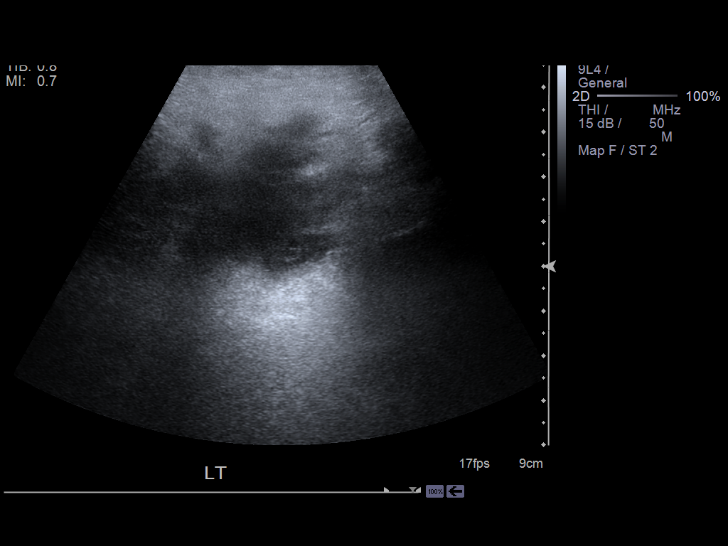
[im 19/37]
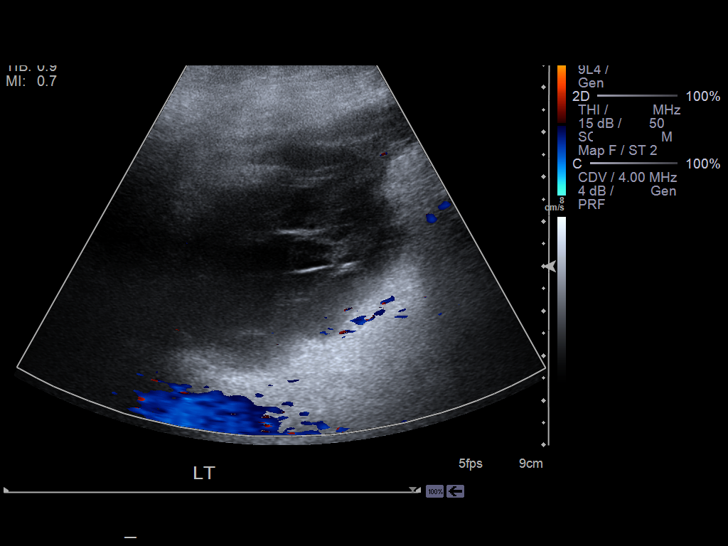
[im 21/37]
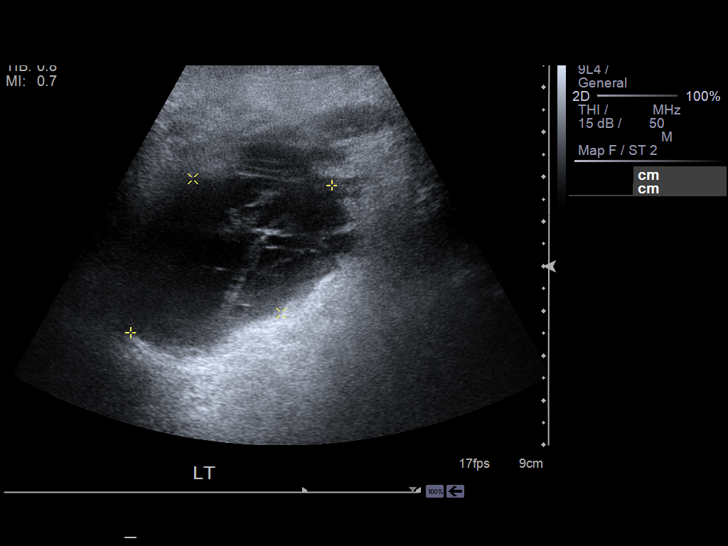
[im 24/37]
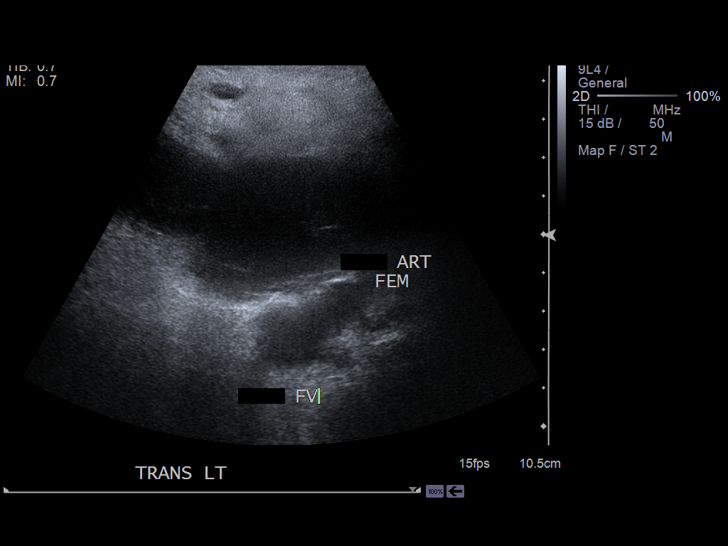
[im 27/37]
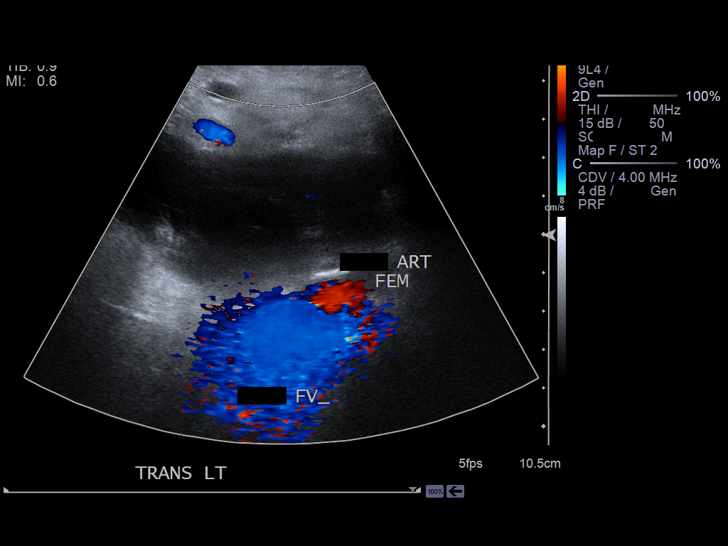
[im 30/37]
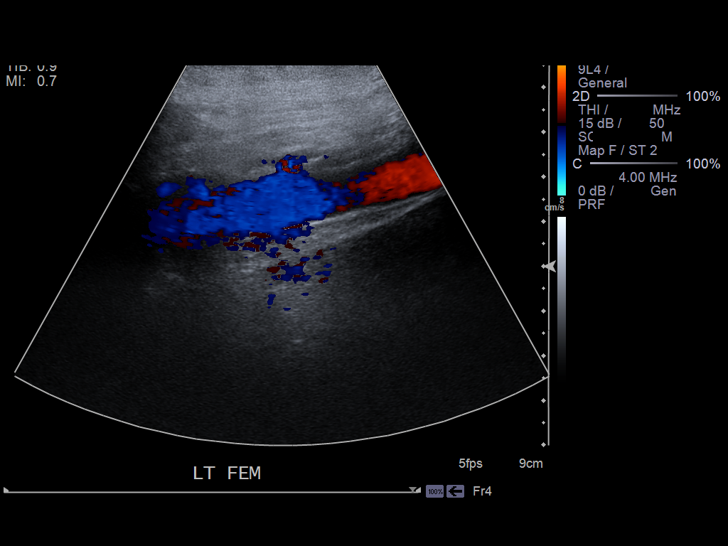
[im 33/37]
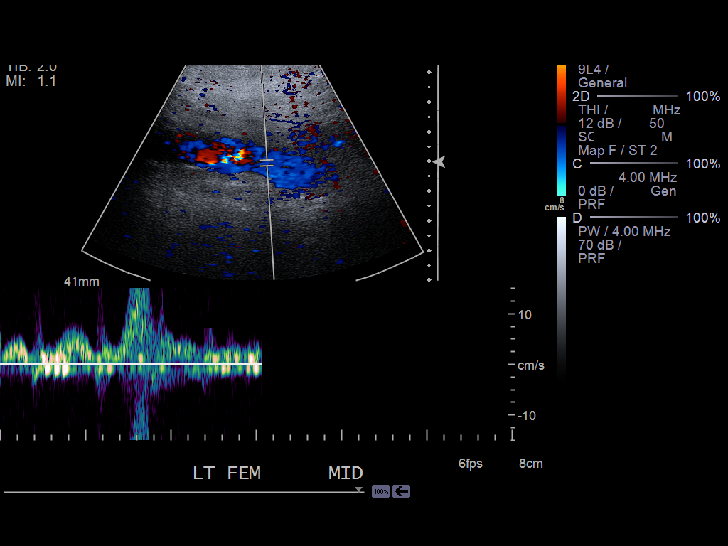
[im 37/37]
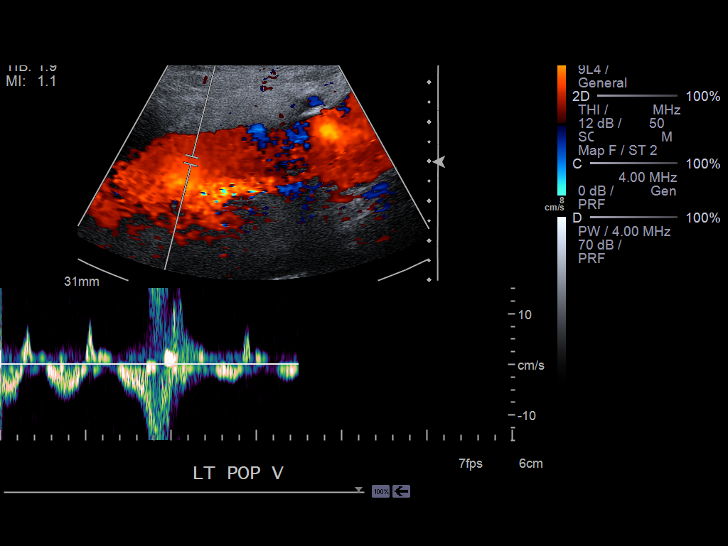

[13 of 24 positions shown; findings below may reference images not displayed]

FINDINGS: Multiple longitudinal and transverse gray-scale as well as color
and spectral Doppler images of the left lower extremity veins were obtained
from the common femoral veins through the popliteal veins.

The left greater saphenous, femoral, popliteal veins, and venous
trifurcation are patent, demonstrating normal color-flow and
compressibility. No intraluminal thrombus is identified.There is normal
respiratory variation and augmentation demonstrated at all vein levels.

The common femoral vein is suboptimally evaluated because there is an open
wound from an arterial procedure in the left groin. Compression is unable to
be performed.

There is a large complex area in the left groin measuring 5.6 x 3.6 x 8.6 cm
with no internal Doppler flow which may represent a hematoma versus a
thrombosed pseudoaneurysm.
IMPRESSION: 1. No evidence of DVT in the left lower extremity.

2. There is a large complex area in the left groin measuring 5.6 x 3.6 x
cm with no internal Doppler flow which may represent a hematoma versus a
thrombosed pseudoaneurysm.

[REDACTED]

## 2014-02-21 ENCOUNTER — Ambulatory Visit: Payer: Self-pay | Admitting: Internal Medicine

## 2014-02-24 IMAGING — CR DG CHEST 2V
1 series · 2 of 2 positions shown · non-contrast
Comparison: none

REASON FOR EXAM: chf
COMMENTS:

[Series 1: x chest ap · 0.14mm/px · 2 of 2 slices shown]
[im 1/2]
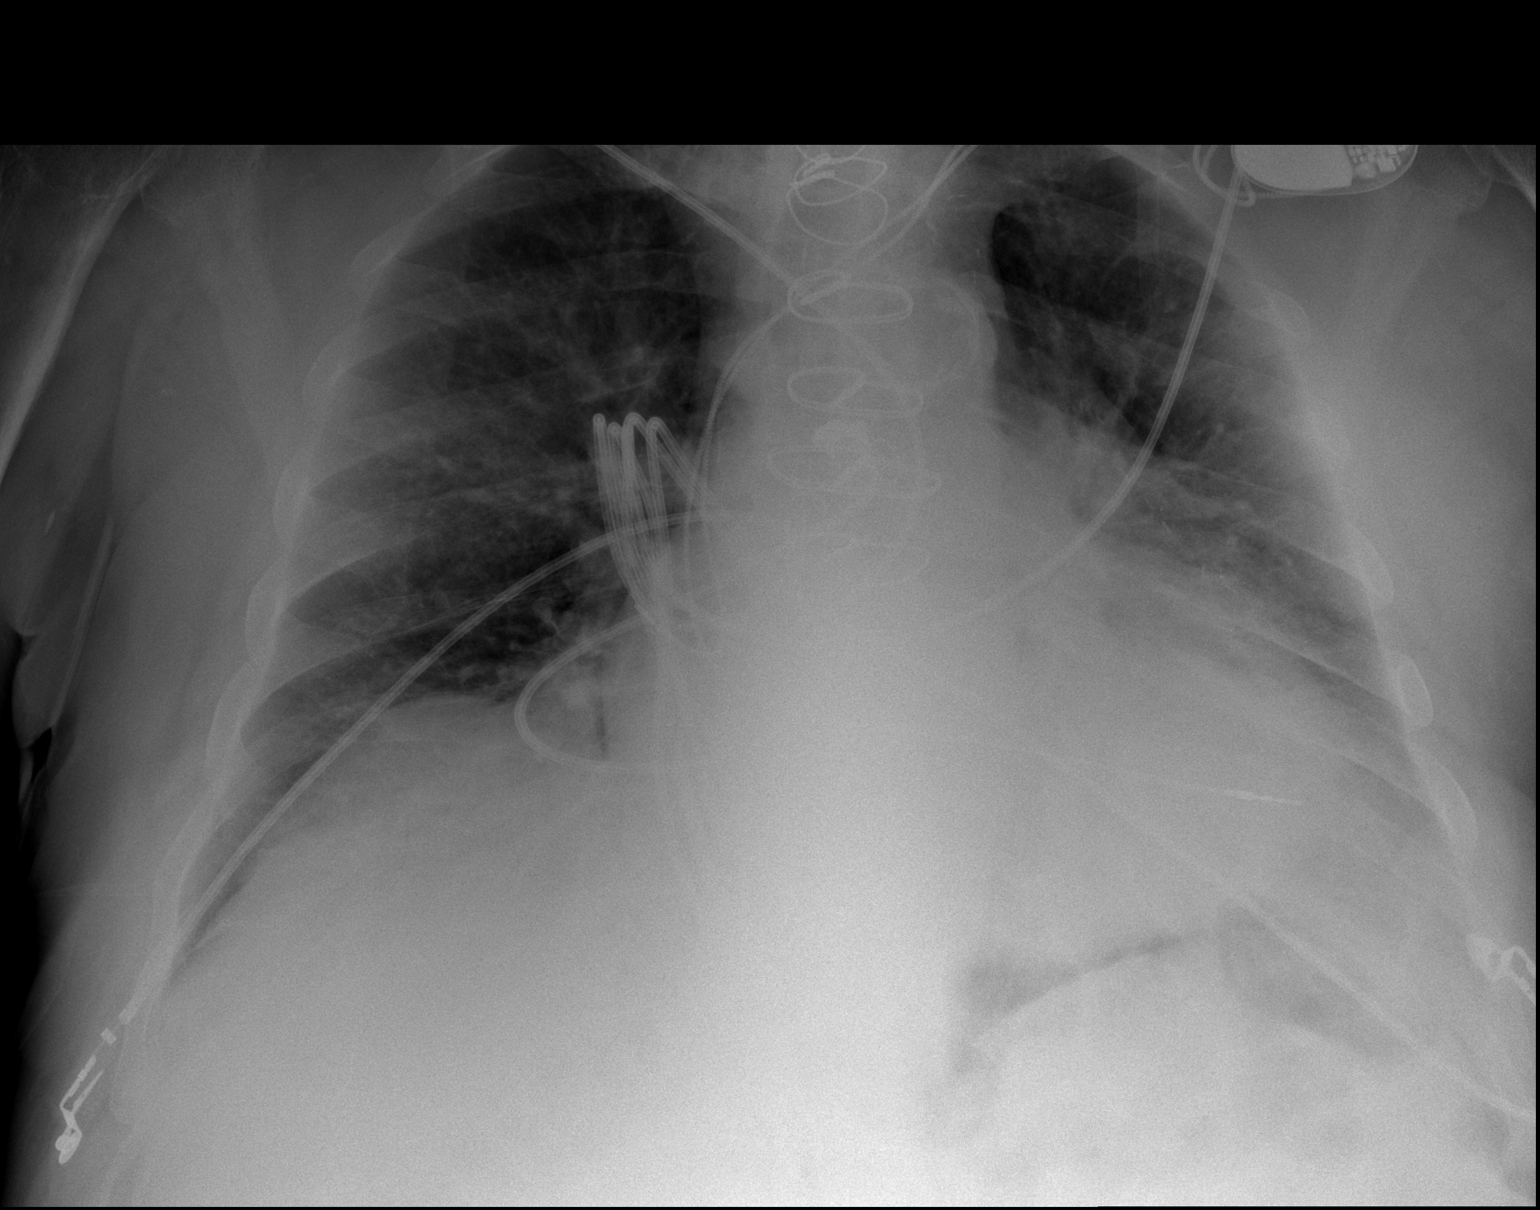
[im 2/2]
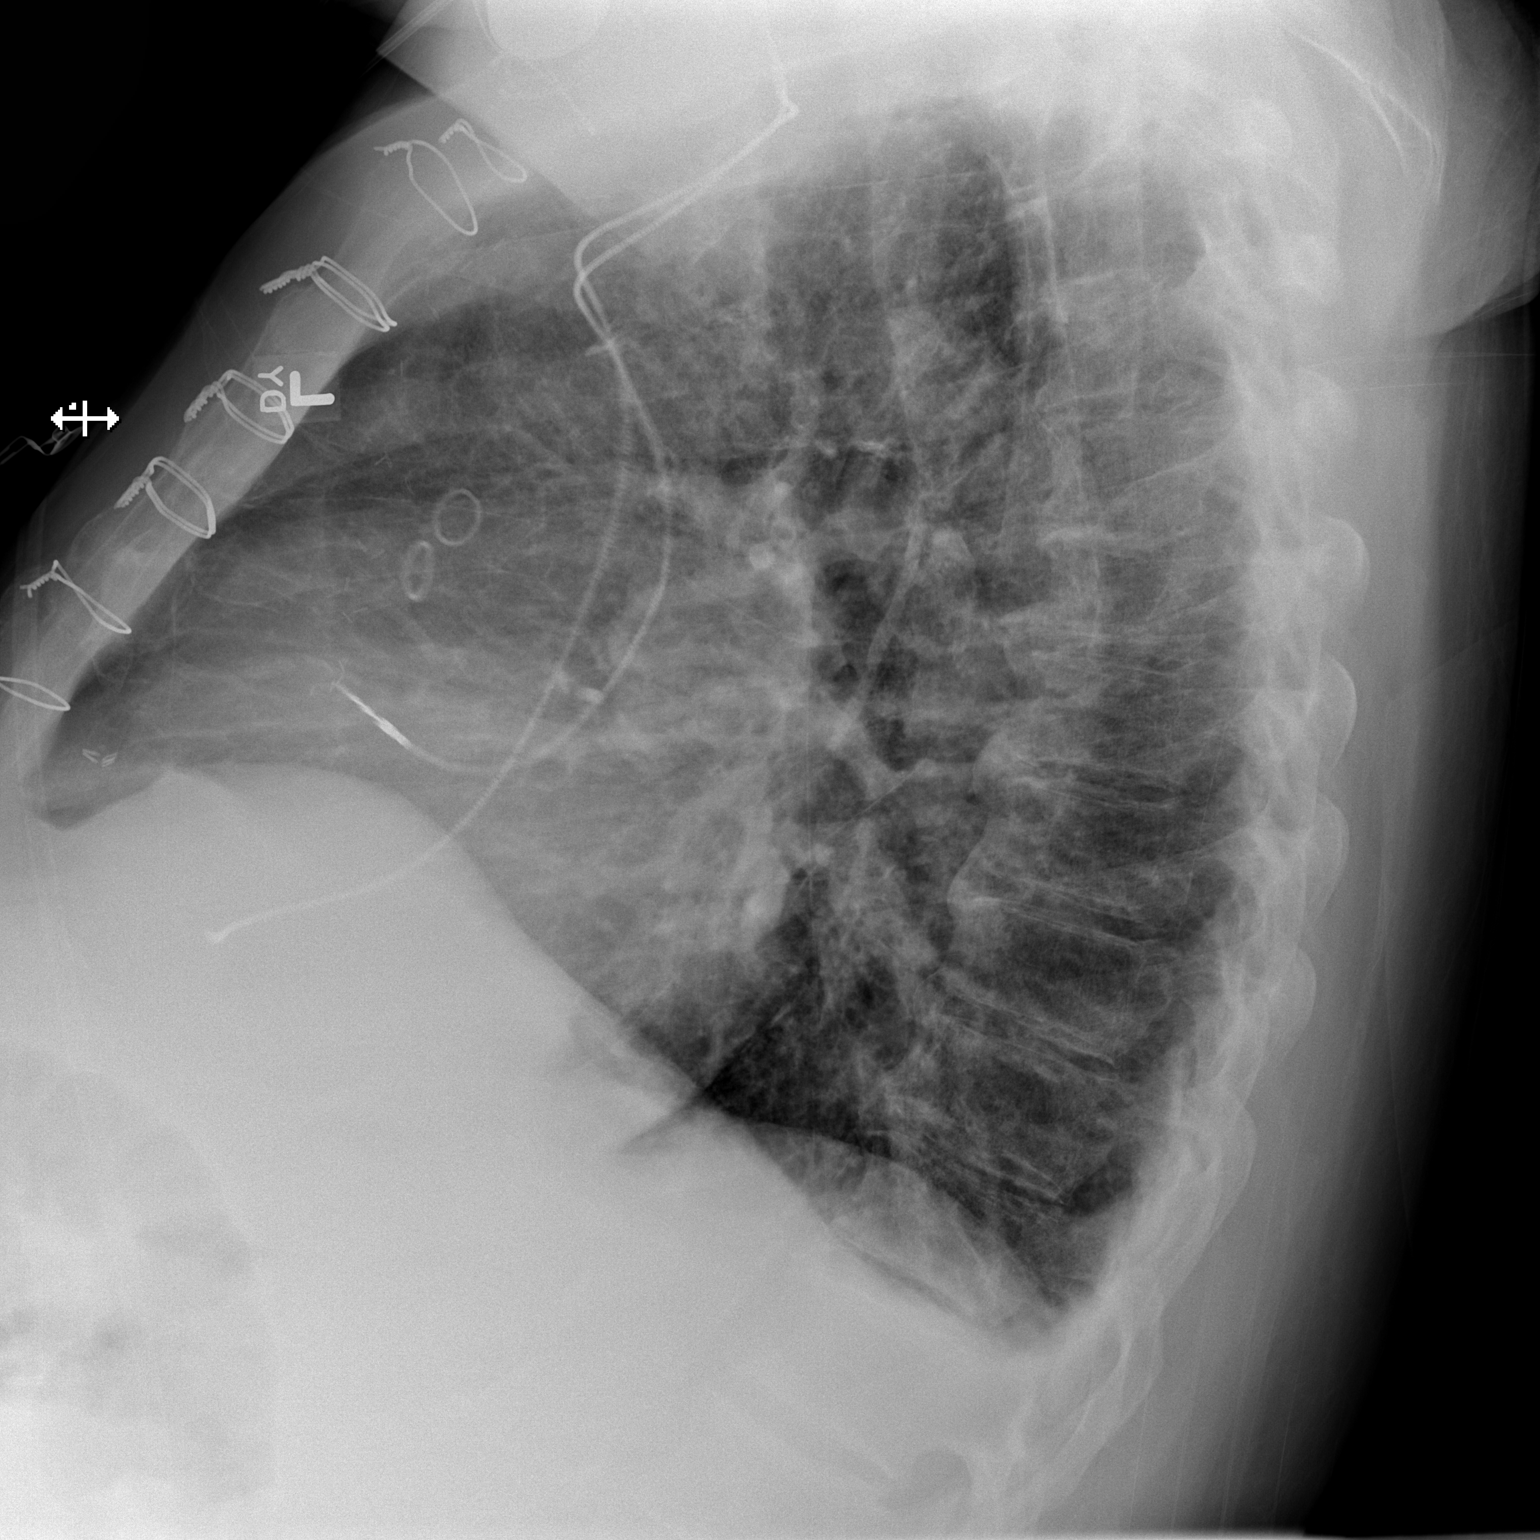

[2 of 2 positions shown; findings below may reference images not displayed]

PROCEDURE:     DXR - DXR CHEST PA (OR AP) AND LATERAL  - July 25, 2012  [DATE]

RESULT:     Comparison is made to the study July 20, 2012.

The cardiac silhouette remains enlarged. Its borders are ill-defined
especially on the left. The pulmonary vascularity is engorged. The right
lung is less well inflated today. No focal infiltrate is demonstrated. The
mid and lower lung on the left are not well evaluated. There is a permanent
pacemaker in place. The patient has undergone previous median sternotomy.
IMPRESSION: Increased density adjacent to the left heart border is
worrisome for atelectasis or pulmonary vascular congestion. Repeat films
with better inspiration and more direct AP positioning is recommended.

[REDACTED]

## 2014-03-02 ENCOUNTER — Ambulatory Visit: Payer: Self-pay | Admitting: Internal Medicine

## 2014-03-07 ENCOUNTER — Ambulatory Visit (INDEPENDENT_AMBULATORY_CARE_PROVIDER_SITE_OTHER): Payer: Medicare Other | Admitting: Internal Medicine

## 2014-03-07 ENCOUNTER — Encounter: Payer: Self-pay | Admitting: Internal Medicine

## 2014-03-07 VITALS — BP 110/52 | HR 81 | Ht 72.0 in | Wt 215.0 lb

## 2014-03-07 DIAGNOSIS — I4891 Unspecified atrial fibrillation: Secondary | ICD-10-CM

## 2014-03-07 DIAGNOSIS — I951 Orthostatic hypotension: Secondary | ICD-10-CM

## 2014-03-07 DIAGNOSIS — N186 End stage renal disease: Secondary | ICD-10-CM

## 2014-03-07 DIAGNOSIS — Z01810 Encounter for preprocedural cardiovascular examination: Secondary | ICD-10-CM

## 2014-03-07 DIAGNOSIS — Z992 Dependence on renal dialysis: Secondary | ICD-10-CM

## 2014-03-07 NOTE — Progress Notes (Signed)
Patient Care Team: Casilda Carls, MD as PCP - General (Internal Medicine)   HPI  Robert [name] is a 73 y.o. male Seen for preoperative cardiac assessment  He has a  history of pacemaker implantation for sinus bradycardia in the context of tachybradycardia syndrome. He has evolved permanent atrial fibrillation w controlled VR  He underwent pacemaker generator replacement 9/13;complicated by infection and subsequent extraction. Given the progression of his atrial fibrillation>>permanent and  the elimination of his sinus node dysfunction,  Device re-implantation was not done   He is not on anticoagulants and this decision has been evaluated and confirmed after discussions with their oncologist  He has significant progression in his disability.   He has had significant bleed with a hemoglobin down to 4 . 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   He has had orthostatic hypotension prompting use of ProAmatine and at last visit we also discussed abdominal binders Which he has not done. He takes proamatine before dialysis which finishes 5-6 hrs later.  He notes that it is very expensive  He has been intercurrently diagnosed with recurrent bladder cancer and nowewith kidney and multiple pulmonary lesions by PET scan He is to have bladder procedure next week   Past Medical History  Diagnosis Date  . CAD (coronary artery disease)     EF 40% 2011 CABG 2004  . PVD (peripheral vascular disease)   . Carotid artery disease   . Diverticulitis   . Complication of anesthesia     hard to wake up  . Hypotension     takes Midodrine only on dialysis days  . Sinus node dysfunction     noted following Afluttter RFCA-takes Proscar daily  . Atrial fibrillation     S/P AFlutter RFCA 2006  . Shortness of breath     tessalon to help with this and cough  . Asthma     Albuterol bid  . Pneumonia     hx of;last time  Jan 2014  . History of bronchitis   . Parkinson's disease   . Confusion   . Short-term memory loss   . H/O multiple concussions     x 3 as a child  . Peripheral neuropathy   . Arthritis   . Joint pain   . Chronic back pain     DDD  . Joint swelling   . Bruises easily   . Thin skin   . H/O hiatal hernia   . GERD (gastroesophageal reflux disease)     takes Omeprazole daily  . History of colon polyps   . ESRD (end stage renal disease)     hemodialysis/M/W/F-[facility]  . Anemia   . History of blood transfusion     no abnormal reaction noted  . Pacemaker     Medtronic is rep  . Hypothyroidism     takes Synthroid daily-no thyroid  . Diabetes mellitus without complication     novolog flexpen prn  . Cataracts, bilateral     scheduled surgery already  . Depression     doesn't take any meds  . Insomnia     nothing works per wife  . Low hemoglobin   . Cancer     lung& kidney    Past Surgical History  Procedure Laterality Date  . Tonsillectomy    . Bladder surgery    . Pacemaker insertion  1/24/6    Medtronic N-rhythm  P1501DR  . Insert / replace / remove pacemaker      Medtronic  . Femoral endarterectomy      left lower  . Toe amputation    . I&d of left foot 2nd toe     . Wound vac placed    . Coronary artery bypass graft      x 5  . Tonsillectomy    . Adenoidectomy    . Cholecystectomy    . Cervical fusion    . Coronary angioplasty      several but unsure how many  . Colonoscopy    . Radiation to thyroid    . Pacemaker lead removal Left 02/10/2013    Procedure: PACEMAKER LEAD REMOVAL;  Surgeon: Evans Lance, MD;  Location: [facility];  Service: Cardiovascular;  Laterality: Left;  . Cataract extraction Left   . Toe amputation Right     Current Outpatient Prescriptions  Medication Sig Dispense Refill  . epoetin alfa (PROCRIT) 3000 UNIT/ML injection Inject 6,000 Units into the skin 2 (two) times a week.        . finasteride (PROSCAR) 5 MG  tablet Take 5 mg by mouth once a week.       . gabapentin (NEURONTIN) 100 MG capsule Take 100 mg by mouth 3 (three) times daily.       Marland Kitchen levothyroxine (SYNTHROID, LEVOTHROID) 75 MCG tablet Take 225 mcg by mouth daily before breakfast.      . midodrine (PROAMATINE) 5 MG tablet Take 5 mg by mouth. 1 tablet by mouth Monday, Wednesday and Friday.      . multivitamin (RENA-VIT) TABS tablet Take 1 tablet by mouth daily.      Marland Kitchen NITROSTAT 0.4 MG SL tablet Place 0.4 mg under the tongue every 5 (five) minutes as needed for chest pain.       Marland Kitchen omeprazole (PRILOSEC) 40 MG capsule Take 40 mg by mouth daily.        Marland Kitchen oxycodone (OXY-IR) 5 MG capsule Take 15 mg by mouth 3 (three) times daily.      . pantoprazole (PROTONIX) 20 MG tablet Take 20 mg by mouth daily.      . polyethylene glycol (MIRALAX / GLYCOLAX) packet Take 17 g by mouth 4 (four) times a week.      . Probiotic Product ([place]) Take by mouth daily.      . ropinirole (REQUIP) 5 MG tablet Take 5 mg by mouth at bedtime.       Marland Kitchen Umeclidinium-Vilanterol (ANORO ELLIPTA) 62.5-25 MCG/INH AEPB Inhale into the lungs 2 (two) times daily.       No current facility-administered medications for this visit.    Allergies  Allergen Reactions  . Vancomycin Shortness Of Breath  . Celebrex [Celecoxib]     Shortness of breath  . Ciprofloxacin Itching  . Dilaudid [Hydromorphone Hcl]     Suicidal thoughts  . Hydromorphone     Other reaction(s): Other (See Comments) Became suicidal Became suicidal  . Iodinated Diagnostic Agents     Other reaction(s): ITCHING  . Lovastatin     Other reaction(s): Unknown  . Morphine Itching  . Niacin Hives    Review of Systems negative except from HPI and PMH  Physical Exam BP 110/52  Pulse 81  Ht 6' (1.829 m)  Wt 215 lb (97.523 kg)  BMI 29.15 kg/m2 Well developed and cachetic and not very comfortable sitting in a wheel chair HENT normal Neck supple    Clear  Irregularly irregular  rate and  rhythm, 2/6 systolic murmur Abd-soft with active BS No Clubbing cyanosis 1-2+ and symmetric edema Skin-warm and dry A & Oriented   unable to stand  ECG demonstrates atrial fibrillation with controlled ventricular response with right bundle left posterior fascicular block rate is 68 Intervals-/19/43 Axis 144   Assessment and  Plan  Atrial fibrillation-permanent  Orthostatic hypotension  End-stage renal disease on hemodialysis  Cancers -multiple  He is doing poorly. A couple of things come to mind.  1. ProAmatine  We discussed the time limitiations inherent in its use.  I also suggested going to the company website tosee if there may be any financial relief  2. An abdominal binder may well help also with orthostasis and post dialytic hypotension    AFib is stable

## 2014-03-07 NOTE — Patient Instructions (Signed)
Your physician wants you to follow-up in: 6 months with Dr. Klein. You will receive a reminder letter in the mail two months in advance. If you don't receive a letter, please call our office to schedule the follow-up appointment.  Your physician recommends that you continue on your current medications as directed. Please refer to the Current Medication list given to you today.  

## 2014-03-09 ENCOUNTER — Encounter: Payer: Self-pay | Admitting: Surgery

## 2014-04-02 ENCOUNTER — Encounter: Payer: Self-pay | Admitting: Surgery

## 2014-04-04 ENCOUNTER — Ambulatory Visit: Payer: Self-pay | Admitting: Internal Medicine

## 2014-04-13 ENCOUNTER — Encounter: Payer: Self-pay | Admitting: Surgery

## 2014-05-02 ENCOUNTER — Ambulatory Visit: Payer: Self-pay | Admitting: Internal Medicine

## 2014-05-02 ENCOUNTER — Encounter: Payer: Self-pay | Admitting: Surgery

## 2014-05-11 ENCOUNTER — Encounter: Payer: Self-pay | Admitting: Surgery

## 2014-05-18 ENCOUNTER — Ambulatory Visit: Payer: Self-pay | Admitting: Internal Medicine

## 2014-05-29 ENCOUNTER — Inpatient Hospital Stay: Payer: Self-pay | Admitting: Internal Medicine

## 2014-05-29 LAB — CK TOTAL AND CKMB (NOT AT ARMC)
CK, TOTAL: 323 U/L — AB (ref 39–308)
CK-MB: 5.9 ng/mL — ABNORMAL HIGH (ref 0.5–3.6)

## 2014-05-29 LAB — COMPREHENSIVE METABOLIC PANEL
Albumin: 3.1 g/dL — ABNORMAL LOW (ref 3.4–5.0)
Alkaline Phosphatase: 190 U/L — ABNORMAL HIGH
Anion Gap: 10 (ref 7–16)
BILIRUBIN TOTAL: 0.8 mg/dL (ref 0.2–1.0)
BUN: 27 mg/dL — AB (ref 7–18)
CREATININE: 5.76 mg/dL — AB (ref 0.60–1.30)
Calcium, Total: 9.5 mg/dL (ref 8.5–10.1)
Chloride: 103 mmol/L (ref 98–107)
Co2: 27 mmol/L (ref 21–32)
EGFR (African American): 12 — ABNORMAL LOW
GFR CALC NON AF AMER: 10 — AB
GLUCOSE: 94 mg/dL (ref 65–99)
Osmolality: 284 (ref 275–301)
Potassium: 4.2 mmol/L (ref 3.5–5.1)
SGOT(AST): 27 U/L (ref 15–37)
SGPT (ALT): 7 U/L — ABNORMAL LOW
SODIUM: 140 mmol/L (ref 136–145)
Total Protein: 7.2 g/dL (ref 6.4–8.2)

## 2014-05-29 LAB — CBC
HCT: 33 % — AB (ref 40.0–52.0)
HGB: 10.1 g/dL — AB (ref 13.0–18.0)
MCH: 28 pg (ref 26.0–34.0)
MCHC: 30.6 g/dL — ABNORMAL LOW (ref 32.0–36.0)
MCV: 91 fL (ref 80–100)
Platelet: 75 10*3/uL — ABNORMAL LOW (ref 150–440)
RBC: 3.61 10*6/uL — ABNORMAL LOW (ref 4.40–5.90)
RDW: 19.2 % — ABNORMAL HIGH (ref 11.5–14.5)
WBC: 6.3 10*3/uL (ref 3.8–10.6)

## 2014-05-29 LAB — TROPONIN I
TROPONIN-I: 0.15 ng/mL — AB
Troponin-I: 0.17 ng/mL — ABNORMAL HIGH

## 2014-05-29 LAB — PROTIME-INR
INR: 1.5
PROTHROMBIN TIME: 17.8 s — AB (ref 11.5–14.7)

## 2014-05-29 LAB — APTT: Activated PTT: 38.1 secs — ABNORMAL HIGH (ref 23.6–35.9)

## 2014-05-29 LAB — TSH: THYROID STIMULATING HORM: 6.31 u[IU]/mL — AB

## 2014-05-29 LAB — PHOSPHORUS: Phosphorus: 4.3 mg/dL (ref 2.5–4.9)

## 2014-05-30 DIAGNOSIS — R000 Tachycardia, unspecified: Secondary | ICD-10-CM

## 2014-05-30 LAB — COMPREHENSIVE METABOLIC PANEL
ALBUMIN: 2.6 g/dL — AB (ref 3.4–5.0)
ALT: 7 U/L — AB
ANION GAP: 8 (ref 7–16)
AST: 40 U/L — AB (ref 15–37)
Alkaline Phosphatase: 203 U/L — ABNORMAL HIGH
BILIRUBIN TOTAL: 1.1 mg/dL — AB (ref 0.2–1.0)
BUN: 19 mg/dL — AB (ref 7–18)
CALCIUM: 8.4 mg/dL — AB (ref 8.5–10.1)
CO2: 28 mmol/L (ref 21–32)
CREATININE: 4.35 mg/dL — AB (ref 0.60–1.30)
Chloride: 104 mmol/L (ref 98–107)
EGFR (Non-African Amer.): 14 — ABNORMAL LOW
GFR CALC AF AMER: 17 — AB
Glucose: 101 mg/dL — ABNORMAL HIGH (ref 65–99)
Osmolality: 282 (ref 275–301)
POTASSIUM: 4 mmol/L (ref 3.5–5.1)
Sodium: 140 mmol/L (ref 136–145)
TOTAL PROTEIN: 6.4 g/dL (ref 6.4–8.2)

## 2014-05-30 LAB — CBC WITH DIFFERENTIAL/PLATELET
BASOS PCT: 1.2 %
Basophil #: 0.2 10*3/uL — ABNORMAL HIGH (ref 0.0–0.1)
EOS PCT: 0.1 %
Eosinophil #: 0 10*3/uL (ref 0.0–0.7)
HCT: 32.1 % — ABNORMAL LOW (ref 40.0–52.0)
HGB: 9.8 g/dL — AB (ref 13.0–18.0)
LYMPHS ABS: 0.5 10*3/uL — AB (ref 1.0–3.6)
Lymphocyte %: 3.4 %
MCH: 27.6 pg (ref 26.0–34.0)
MCHC: 30.7 g/dL — ABNORMAL LOW (ref 32.0–36.0)
MCV: 90 fL (ref 80–100)
MONOS PCT: 6.5 %
Monocyte #: 0.9 x10 3/mm (ref 0.2–1.0)
NEUTROS ABS: 11.9 10*3/uL — AB (ref 1.4–6.5)
Neutrophil %: 88.8 %
PLATELETS: 72 10*3/uL — AB (ref 150–440)
RBC: 3.57 10*6/uL — ABNORMAL LOW (ref 4.40–5.90)
RDW: 18.8 % — AB (ref 11.5–14.5)
WBC: 13.4 10*3/uL — ABNORMAL HIGH (ref 3.8–10.6)

## 2014-05-30 LAB — TROPONIN I
Troponin-I: 0.26 ng/mL — ABNORMAL HIGH
Troponin-I: 0.63 ng/mL — ABNORMAL HIGH

## 2014-05-30 LAB — CK-MB: CK-MB: 2.2 ng/mL (ref 0.5–3.6)

## 2014-05-31 LAB — TROPONIN I
TROPONIN-I: 0.64 ng/mL — AB
Troponin-I: 0.56 ng/mL — ABNORMAL HIGH

## 2014-05-31 LAB — BASIC METABOLIC PANEL
ANION GAP: 7 (ref 7–16)
BUN: 28 mg/dL — ABNORMAL HIGH (ref 7–18)
CO2: 28 mmol/L (ref 21–32)
CREATININE: 5.34 mg/dL — AB (ref 0.60–1.30)
Calcium, Total: 9.2 mg/dL (ref 8.5–10.1)
Chloride: 99 mmol/L (ref 98–107)
EGFR (African American): 14 — ABNORMAL LOW
EGFR (Non-African Amer.): 11 — ABNORMAL LOW
Glucose: 106 mg/dL — ABNORMAL HIGH (ref 65–99)
Osmolality: 274 (ref 275–301)
POTASSIUM: 4.1 mmol/L (ref 3.5–5.1)
Sodium: 134 mmol/L — ABNORMAL LOW (ref 136–145)

## 2014-05-31 LAB — CBC WITH DIFFERENTIAL/PLATELET
BASOS ABS: 0 10*3/uL (ref 0.0–0.1)
BASOS PCT: 0.3 %
Eosinophil #: 0.1 10*3/uL (ref 0.0–0.7)
Eosinophil %: 0.8 %
HCT: 34.1 % — ABNORMAL LOW (ref 40.0–52.0)
HGB: 10.6 g/dL — ABNORMAL LOW (ref 13.0–18.0)
Lymphocyte #: 1.6 10*3/uL (ref 1.0–3.6)
Lymphocyte %: 13.3 %
MCH: 27.6 pg (ref 26.0–34.0)
MCHC: 31 g/dL — ABNORMAL LOW (ref 32.0–36.0)
MCV: 89 fL (ref 80–100)
MONO ABS: 1.4 x10 3/mm — AB (ref 0.2–1.0)
Monocyte %: 11.5 %
Neutrophil #: 8.8 10*3/uL — ABNORMAL HIGH (ref 1.4–6.5)
Neutrophil %: 74.1 %
PLATELETS: 70 10*3/uL — AB (ref 150–440)
RBC: 3.83 10*6/uL — ABNORMAL LOW (ref 4.40–5.90)
RDW: 18.3 % — ABNORMAL HIGH (ref 11.5–14.5)
WBC: 11.9 10*3/uL — AB (ref 3.8–10.6)

## 2014-05-31 LAB — CK-MB
CK-MB: 2 ng/mL (ref 0.5–3.6)
CK-MB: 2 ng/mL (ref 0.5–3.6)

## 2014-05-31 LAB — VANCOMYCIN, TROUGH: Vancomycin, Trough: 11 ug/mL (ref 10–20)

## 2014-05-31 LAB — PHOSPHORUS: PHOSPHORUS: 3.9 mg/dL (ref 2.5–4.9)

## 2014-06-01 DIAGNOSIS — I517 Cardiomegaly: Secondary | ICD-10-CM

## 2014-06-01 LAB — BASIC METABOLIC PANEL
ANION GAP: 7 (ref 7–16)
BUN: 22 mg/dL — AB (ref 7–18)
Calcium, Total: 9.3 mg/dL (ref 8.5–10.1)
Chloride: 98 mmol/L (ref 98–107)
Co2: 33 mmol/L — ABNORMAL HIGH (ref 21–32)
Creatinine: 4.24 mg/dL — ABNORMAL HIGH (ref 0.60–1.30)
EGFR (African American): 18 — ABNORMAL LOW
EGFR (Non-African Amer.): 15 — ABNORMAL LOW
GLUCOSE: 110 mg/dL — AB (ref 65–99)
Osmolality: 280 (ref 275–301)
POTASSIUM: 3.8 mmol/L (ref 3.5–5.1)
SODIUM: 138 mmol/L (ref 136–145)

## 2014-06-01 LAB — CBC WITH DIFFERENTIAL/PLATELET
BASOS ABS: 0 10*3/uL (ref 0.0–0.1)
Basophil %: 0.5 %
EOS PCT: 1.9 %
Eosinophil #: 0.1 10*3/uL (ref 0.0–0.7)
HCT: 36.1 % — ABNORMAL LOW (ref 40.0–52.0)
HGB: 11.2 g/dL — ABNORMAL LOW (ref 13.0–18.0)
LYMPHS ABS: 1.2 10*3/uL (ref 1.0–3.6)
LYMPHS PCT: 16.1 %
MCH: 27.5 pg (ref 26.0–34.0)
MCHC: 31 g/dL — ABNORMAL LOW (ref 32.0–36.0)
MCV: 89 fL (ref 80–100)
MONO ABS: 0.9 x10 3/mm (ref 0.2–1.0)
Monocyte %: 12 %
Neutrophil #: 5.2 10*3/uL (ref 1.4–6.5)
Neutrophil %: 69.5 %
PLATELETS: 67 10*3/uL — AB (ref 150–440)
RBC: 4.07 10*6/uL — AB (ref 4.40–5.90)
RDW: 18.7 % — ABNORMAL HIGH (ref 11.5–14.5)
WBC: 7.4 10*3/uL (ref 3.8–10.6)

## 2014-06-01 LAB — PHOSPHORUS: PHOSPHORUS: 3.6 mg/dL (ref 2.5–4.9)

## 2014-06-02 ENCOUNTER — Ambulatory Visit: Payer: Self-pay | Admitting: Internal Medicine

## 2014-06-02 ENCOUNTER — Encounter: Payer: Self-pay | Admitting: Surgery

## 2014-06-02 LAB — CULTURE, BLOOD (SINGLE)

## 2014-06-03 LAB — RENAL FUNCTION PANEL
ANION GAP: 6 — AB (ref 7–16)
Albumin: 2.5 g/dL — ABNORMAL LOW (ref 3.4–5.0)
BUN: 24 mg/dL — AB (ref 7–18)
Calcium, Total: 9.1 mg/dL (ref 8.5–10.1)
Chloride: 100 mmol/L (ref 98–107)
Co2: 31 mmol/L (ref 21–32)
Creatinine: 4.97 mg/dL — ABNORMAL HIGH (ref 0.60–1.30)
EGFR (African American): 15 — ABNORMAL LOW
EGFR (Non-African Amer.): 12 — ABNORMAL LOW
Glucose: 105 mg/dL — ABNORMAL HIGH (ref 65–99)
Osmolality: 278 (ref 275–301)
PHOSPHORUS: 4.3 mg/dL (ref 2.5–4.9)
Potassium: 4.1 mmol/L (ref 3.5–5.1)
Sodium: 137 mmol/L (ref 136–145)

## 2014-06-03 LAB — CBC WITH DIFFERENTIAL/PLATELET
BASOS ABS: 0 10*3/uL (ref 0.0–0.1)
Basophil #: 0 10*3/uL (ref 0.0–0.1)
Basophil %: 0.8 %
Basophil %: 0.7 %
EOS ABS: 0.1 10*3/uL (ref 0.0–0.7)
EOS ABS: 0.1 10*3/uL (ref 0.0–0.7)
Eosinophil %: 1.8 %
Eosinophil %: 2.3 %
HCT: 35 % — AB (ref 40.0–52.0)
HCT: 31.8 % — ABNORMAL LOW (ref 40.0–52.0)
HGB: 11 g/dL — ABNORMAL LOW (ref 13.0–18.0)
HGB: 9.8 g/dL — ABNORMAL LOW (ref 13.0–18.0)
LYMPHS PCT: 20.9 %
LYMPHS PCT: 23 %
Lymphocyte #: 1.2 10*3/uL (ref 1.0–3.6)
Lymphocyte #: 1.1 10*3/uL (ref 1.0–3.6)
MCH: 27.8 pg (ref 26.0–34.0)
MCH: 27.5 pg (ref 26.0–34.0)
MCHC: 31.5 g/dL — ABNORMAL LOW (ref 32.0–36.0)
MCHC: 30.8 g/dL — AB (ref 32.0–36.0)
MCV: 88 fL (ref 80–100)
MCV: 89 fL (ref 80–100)
MONOS PCT: 11.6 %
Monocyte #: 0.7 x10 3/mm (ref 0.2–1.0)
Monocyte #: 0.5 x10 3/mm (ref 0.2–1.0)
Monocyte %: 11.6 %
NEUTROS ABS: 3.7 10*3/uL (ref 1.4–6.5)
NEUTROS PCT: 62.4 %
Neutrophil #: 3 10*3/uL (ref 1.4–6.5)
Neutrophil %: 64.9 %
PLATELETS: 63 10*3/uL — AB (ref 150–440)
Platelet: 68 10*3/uL — ABNORMAL LOW (ref 150–440)
RBC: 3.97 10*6/uL — AB (ref 4.40–5.90)
RBC: 3.56 10*6/uL — ABNORMAL LOW (ref 4.40–5.90)
RDW: 18.5 % — ABNORMAL HIGH (ref 11.5–14.5)
RDW: 18.1 % — AB (ref 11.5–14.5)
WBC: 5.7 10*3/uL (ref 3.8–10.6)
WBC: 4.7 10*3/uL (ref 3.8–10.6)

## 2014-06-04 LAB — CBC WITH DIFFERENTIAL/PLATELET
BASOS PCT: 1.4 %
Basophil #: 0.1 10*3/uL (ref 0.0–0.1)
EOS ABS: 0.1 10*3/uL (ref 0.0–0.7)
Eosinophil %: 1.9 %
HCT: 32.4 % — ABNORMAL LOW (ref 40.0–52.0)
HGB: 10.1 g/dL — ABNORMAL LOW (ref 13.0–18.0)
LYMPHS PCT: 20.6 %
Lymphocyte #: 1 10*3/uL (ref 1.0–3.6)
MCH: 27.5 pg (ref 26.0–34.0)
MCHC: 31.2 g/dL — ABNORMAL LOW (ref 32.0–36.0)
MCV: 88 fL (ref 80–100)
Monocyte #: 0.6 x10 3/mm (ref 0.2–1.0)
Monocyte %: 12.4 %
Neutrophil #: 3 10*3/uL (ref 1.4–6.5)
Neutrophil %: 63.7 %
Platelet: 65 10*3/uL — ABNORMAL LOW (ref 150–440)
RBC: 3.67 10*6/uL — ABNORMAL LOW (ref 4.40–5.90)
RDW: 18.2 % — ABNORMAL HIGH (ref 11.5–14.5)
WBC: 4.7 10*3/uL (ref 3.8–10.6)

## 2014-06-04 LAB — BASIC METABOLIC PANEL
Anion Gap: 6 — ABNORMAL LOW (ref 7–16)
BUN: 20 mg/dL — ABNORMAL HIGH (ref 7–18)
CALCIUM: 8.9 mg/dL (ref 8.5–10.1)
CO2: 33 mmol/L — AB (ref 21–32)
Chloride: 99 mmol/L (ref 98–107)
Creatinine: 4.37 mg/dL — ABNORMAL HIGH (ref 0.60–1.30)
EGFR (African American): 17 — ABNORMAL LOW
EGFR (Non-African Amer.): 14 — ABNORMAL LOW
Glucose: 86 mg/dL (ref 65–99)
Osmolality: 278 (ref 275–301)
POTASSIUM: 4 mmol/L (ref 3.5–5.1)
Sodium: 138 mmol/L (ref 136–145)

## 2014-06-05 LAB — CBC WITH DIFFERENTIAL/PLATELET
BASOS PCT: 0.6 %
Basophil #: 0 10*3/uL (ref 0.0–0.1)
Basophil #: 0 10*3/uL (ref 0.0–0.1)
Basophil %: 0.6 %
EOS ABS: 0.1 10*3/uL (ref 0.0–0.7)
EOS PCT: 2.2 %
Eosinophil #: 0.1 10*3/uL (ref 0.0–0.7)
Eosinophil %: 1.8 %
HCT: 31.8 % — ABNORMAL LOW (ref 40.0–52.0)
HCT: 30.9 % — AB (ref 40.0–52.0)
HGB: 9.8 g/dL — ABNORMAL LOW (ref 13.0–18.0)
HGB: 9.4 g/dL — ABNORMAL LOW (ref 13.0–18.0)
LYMPHS PCT: 23.1 %
Lymphocyte #: 1.1 10*3/uL (ref 1.0–3.6)
Lymphocyte #: 1.1 10*3/uL (ref 1.0–3.6)
Lymphocyte %: 22.9 %
MCH: 27.3 pg (ref 26.0–34.0)
MCH: 27.2 pg (ref 26.0–34.0)
MCHC: 30.8 g/dL — AB (ref 32.0–36.0)
MCHC: 30.5 g/dL — ABNORMAL LOW (ref 32.0–36.0)
MCV: 89 fL (ref 80–100)
MCV: 89 fL (ref 80–100)
MONO ABS: 0.6 x10 3/mm (ref 0.2–1.0)
Monocyte #: 0.5 x10 3/mm (ref 0.2–1.0)
Monocyte %: 13.6 %
Monocyte %: 10.8 %
NEUTROS ABS: 2.9 10*3/uL (ref 1.4–6.5)
NEUTROS PCT: 63.9 %
Neutrophil #: 2.9 10*3/uL (ref 1.4–6.5)
Neutrophil %: 60.5 %
Platelet: 64 10*3/uL — ABNORMAL LOW (ref 150–440)
Platelet: 70 10*3/uL — ABNORMAL LOW (ref 150–440)
RBC: 3.58 10*6/uL — ABNORMAL LOW (ref 4.40–5.90)
RBC: 3.46 10*6/uL — ABNORMAL LOW (ref 4.40–5.90)
RDW: 17.6 % — ABNORMAL HIGH (ref 11.5–14.5)
RDW: 17.7 % — ABNORMAL HIGH (ref 11.5–14.5)
WBC: 4.7 10*3/uL (ref 3.8–10.6)
WBC: 4.6 10*3/uL (ref 3.8–10.6)

## 2014-06-05 LAB — RENAL FUNCTION PANEL
Albumin: 2.6 g/dL — ABNORMAL LOW (ref 3.4–5.0)
Anion Gap: 8 (ref 7–16)
BUN: 28 mg/dL — AB (ref 7–18)
Calcium, Total: 9.1 mg/dL (ref 8.5–10.1)
Chloride: 98 mmol/L (ref 98–107)
Co2: 31 mmol/L (ref 21–32)
Creatinine: 5.39 mg/dL — ABNORMAL HIGH (ref 0.60–1.30)
EGFR (African American): 13 — ABNORMAL LOW
EGFR (Non-African Amer.): 11 — ABNORMAL LOW
GLUCOSE: 125 mg/dL — AB (ref 65–99)
OSMOLALITY: 281 (ref 275–301)
PHOSPHORUS: 4.5 mg/dL (ref 2.5–4.9)
Potassium: 4.3 mmol/L (ref 3.5–5.1)
Sodium: 137 mmol/L (ref 136–145)

## 2014-06-07 ENCOUNTER — Ambulatory Visit: Payer: Self-pay | Admitting: Internal Medicine

## 2014-06-07 ENCOUNTER — Encounter: Payer: Self-pay | Admitting: Surgery

## 2014-06-09 LAB — CULTURE, BLOOD (SINGLE)

## 2014-06-10 LAB — CULTURE, BLOOD (SINGLE)

## 2014-06-13 ENCOUNTER — Ambulatory Visit: Payer: Self-pay | Admitting: Vascular Surgery

## 2014-06-15 ENCOUNTER — Encounter: Payer: Self-pay | Admitting: Surgery

## 2014-07-03 ENCOUNTER — Encounter: Payer: Self-pay | Admitting: Surgery

## 2014-08-01 ENCOUNTER — Encounter
Admit: 2014-08-01 | Disposition: A | Discharge status: Home / Self Care | Payer: Self-pay | Attending: Surgery | Admitting: Surgery

## 2014-08-01 DEATH — deceased

## 2014-09-19 NOTE — Consult Note (Signed)
PATIENT NAME:  Robert [name], Robert [name] MR#:  [id number] DATE OF BIRTH:  February 15, 1941  DATE OF CONSULTATION:  12/26/2011  REFERRING PHYSICIAN:   CONSULTING PHYSICIAN:  Lupita Dawn. Salvadore Valvano, MD  REASON FOR REFERRAL: Abdominal pain and possible pancreatitis.   HISTORY OF PRESENT ILLNESS: The patient is a 74 year old white male with multiple medical history including atrial fibrillation, Parkinson's disease, cardiomyopathy, diabetes, coronary artery disease, and peripheral vascular disease. He has been having intermittent abdominal pain associated with some diarrhea for the past two weeks ever since he had a cystoscopy three weeks ago. He woke up around 1:30 in the morning with rather severe stabbing abdominal pain in the epigastric region. Because of the persistent symptoms, the patient came to the Emergency Room for evaluation. The patient was given IV fluids. He was found to have an abnormal lipase level. CT scan was also performed that suggested colitis. As a result, the patient was brought in for further evaluation and treatment. He did have one episode of vomiting which may have been coffee-ground. He has had intermittent diarrhea for a few weeks alternating with constipation. He denies seeing any gross blood per rectum.   PAST MEDICAL HISTORY: Past medical history is extensive. 1. History of bladder cancer.  2. History of hypertension. 3. End-stage renal disease requiring hemodialysis every Monday, Wednesday, and Friday.  4. Cardiomyopathy.   5. Non-insulin-dependent diabetes.  6. Atrial fibrillation. 7. Chronic obstructive pulmonary disease. 8. Sleep apnea. 9. Parkinson's disease.  10. Peripheral vascular disease. 11. History of hypothyroidism with history of Graves' disease.    PAST SURGICAL HISTORY:  1. Coronary artery bypass surgery. 2. Carotid endarterectomy.  3. Cholecystectomy. 4. Pacemaker placement.  5. Cervical effusion. 6. Dialysis graft placement. 7. Bladder surgery. 8. Lower extremity  stents.   DRUG ALLERGIES: Cipro, IV contrast, niacin, morphine, Celebrex.   MEDICATIONS AT HOME:  1. Baby aspirin. 2. Trazodone. 3. Requip. 4. Lunesta. 5. Coreg. 6. Probiotics. 7. Omeprazole. 8. Levoxyl. 9. Vitamins.   SOCIAL HISTORY: He denies any alcohol or smoking.  FAMILY HISTORY: Notable for coronary artery disease.   REVIEW OF SYSTEMS: There is no fever or chills. There is no weight changes. There is no fatigue or weakness. He denies any recent visual changes and there is no hearing changes. There is no coughing or shortness of breath. There is no chest pain or palpitations. GI symptoms have been already described. He does have some bleeding in the urine. Rest of the symptoms have not changed since admission. Please refer to the review of systems dictated by the admitting doctor.   PHYSICAL EXAMINATION:   GENERAL: The patient is in no acute distress right now.   VITAL SIGNS: He is afebrile. His pulse was 88, respirations 18, blood pressure 170/73, pulse oximetry 96. He has minimal pain at this point. He is alert and oriented.   HEENT: Normocephalic, atraumatic head. Pupils are equally reactive. Throat was clear.  NECK: Supple.   CARDIAC: Irregularly irregular rhythm.   LUNGS: Clear bilaterally.   ABDOMEN: Normoactive bowel sounds, soft. Some mild tenderness in the epigastric but more so in the left lower quadrant abdominal area. There is some decreased bowel sounds. There is no hepatomegaly. There are no palpable masses.   EXTREMITIES: No clubbing, cyanosis, or edema.   NEUROLOGIC: Nonfocal.   SKIN: Negative.  LABORATORY, DIAGNOSTIC, AND RADIOLOGICAL DATA: White count 7, hemoglobin 13.3, platelet count 120, BUN 53, creatinine 6.83, sodium 131. Lipase level 1500. Liver enzymes are normal except for alkaline phosphatase which was  slightly elevated at 166.   CT examination showed some thickening of the sigmoid colon. This was done without contrast. The pancreas looks  normal.   IMPRESSION: This is a patient who appears to have acute pancreatitis at least by history and exam, although the pancreas looks normal on the CT scan without contrast. There is no alcohol history. His gallbladder is out so it is unclear as to the etiology of this pancreatitis. Lipase can go up in people with renal disease. Triglyceride level has been sent. The patient is already getting some clear liquid diet. I would not recommend solid food until the lipase and the pain has improved.   The patient also has had some diarrhea issues recently with CT scan suggesting colitis. The patient had last colonoscopy in 2006 by Dr. 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 The left side of the colon looked normal. Will consider endoscopic evaluation by Monday if the pancreatitis resolves. If he continues to have elevated levels, then we may consider doing a flexible sigmoidoscopy to evaluate the sigmoid colon. Otherwise, if he feels better then we can do a bowel prep with a full colonoscopy. Will also need to find out the etiology of his diarrhea whether it is C. difficile or other infectious cause.      Thank you for the referral.   ____________________________ Lupita Dawn. Candace Cruise, MD pyo:drc D: 12/28/2011 07:25:00 ET T: 12/28/2011 11:33:48 ET JOB#: [id number]  cc: Lupita Dawn. Candace Cruise, MD, <Dictator> Lupita Dawn William Schake MD ELECTRONICALLY SIGNED 12/29/2011 13:53

## 2014-09-19 NOTE — H&P (Signed)
PATIENT NAME:  Robert [name], Robert [name] MR#:  [id number] DATE OF BIRTH:  Oct 17, 1940  DATE OF ADMISSION:  12/26/2011  REASON FOR ADMISSION: Abdominal pain/acute pancreatitis.  PRIMARY CARE PHYSICIAN: Lanae Boast, MD  HISTORY OF PRESENT ILLNESS: [name] is a very nice 74 year old gentleman who has history of multiple medical problems including hypertension, atrial fibrillation, Parkinson's disease, hypothyroidism, cardiomyopathy, diabetes, coronary artery disease, and peripheral vascular disease. The patient states that he has been having occasional abdominal pain on and off with constipation and diarrhea since his cystoscopy which happened about three weeks ago. He has been having some hematuria for which some tests were done in his urologist's office, but he has not received the results. He was told that he did not have any recurrence of his bladder cancer. Yesterday, at night, he went home and was in his normal state of health without any significant issues, but this morning at 1:30 a.m. he was woke up by sharp, stabbing pain located in his mesogastric area.  The pain was continuous and he did not have any significant relief with changes of position or rest or moving around. The patient decided to come to the emergency room due to pain increasing in intensity up to 9 or 10 out of 10 on the scale of pain from 0 to 10.  The patient states that the pain is again sharp, stabbing and continuous, and was not relieved with IV medication given by the emergency room physician. The patient states that the pain is radiating to his back in a belt-like fashion and it is becoming more intense. The patient has endstage renal disease for which he receives dialysis with the [facility] and he was supposed to have hemodialysis today around 5:00 a.m. this morning, although he missed it already. He is concerned about having that done at some point. For most the patient is doing better. He has not been able to eat  anything today. He did have one episode of vomiting which was coffee-ground emesis, but he has not seen any fresh blood and he denies any significant melena. He has been constipated on and off with occasional diarrhea in the past, but now within the last week he has been very constipated with hard stools.  He denies any fever, chills, or significant weight loss or weight gain.   PAST MEDICAL HISTORY:  1. End-stage renal disease. 2. Hypertension. 3. Benign prostatic hypertrophy. 4. History of bladder cancer.  5. Cardiomyopathy. 6. Non-insulin-dependent diabetes, well-controlled. 7. Atrial fibrillation. 8. Chronic back pain.  9. Chronic obstructive pulmonary disease. 10. Obstructive sleep apnea, not tolerating CPAP, currently on oxygen nasal cannula at night only. 11. Parkinson's disease. 12. History of chronic disease anemia.  13. Dyslipidemia. 14. Peripheral vascular disease. 15. Carotid artery disease. 43. Hypothyroidism with history of Grave's disease treated with ablation.  PAST SURGICAL HISTORY:  1. Carotid endarterectomy. 2. Coronary artery bypass graft x5. 3. Permanent pacemaker. 4. Cholecystectomy. 5. Cervical spine fusion. 6. Hemodialysis graft. 7. Lower extremity stents. 8. Bladder surgery. 9. Biopsy of kidney lesion.  DRUG ALLERGIES: Celebrex, ciprofloxacin, IV contrast, morphine, and niacin.  MEDICATIONS: 1. Avodart 5 mg once weekly. 2. Trazodone 50 mg at bedtime. 3. Requip 5 mg three times daily.  4. Lunesta 2 mg at bedtime. 5. Coreg 12.5 mg twice a day. 6. ProAir. 7. Coenzyme Q-10. 8. Probiotics. 9. Omeprazole 40 mg capsule. 10. Levoxyl 150 mcg once daily. 11. Renal vitamins. 12. Vitamin D3.  13. Aspirin 81 mg daily.  SOCIAL HISTORY: The  patient denies any alcohol, smoking, or drugs. He lives with his wife. He is very independent. He receives dialysis on Monday, Wednesday, and Friday with the [facility].   FAMILY HISTORY: Positive for coronary artery  disease in multiple members of his family.  REVIEW OF SYSTEMS: CONSTITUTIONAL: The patient denies any fever, fatigue, weakness, or significant weight loss or weight gain. EYES: He states that his vision is decreasing in acuity within the last year. He denies any cataracts or glaucoma. ENT: Denies any tinnitus, epistaxis, nasal drip, or significant allergies. RESPIRATORY: Denies any cough, wheezing, hemoptysis, or history of pneumonia. CARDIOVASCULAR: Denies any chest pain, orthopnea, persistent nocturnal dyspnea, or wheezing. GASTROINTESTINAL: Positive for abdominal pain, as mentioned in the History of Present Illness. Constipation as well. GENITOURINARY: Positive for hematuria which is known by his urologist. It happened after cystoscopy and has become persistent. ENDOCRINE: The patient has history of diabetes but denies any polyuria, polydipsia, or polyphagia. He does have hypothyroidism with prior history of Grave's disease, but no significant symptoms. HEMATOLOGIC/LYMPHATIC: Positive for easy bruising, positive for bleeding, and as mentioned above hematuria, but no other bleeding places. SKIN: No significant rashes or lesions. MUSCULOSKELETAL: Positive for lower extremity pain, especially of the left knee status post steroid injection yesterday. Denies any significant joint deformity or effusion.  NEUROLOGIC: No numbness or weakness. No ataxia. No dementia. No headaches. PSYCHIATRIC: Denies any anxiety or significant depression.   PHYSICAL EXAMINATION:   VITALS: Temperature 95.4, pulse 88, respiratory rate 18, systolic blood pressure 063, diastolic blood pressure 73, and pulse oximetry 96% on room air.  GENERAL: The patient is alert and oriented x3, in no acute distress, no respiratory distress. He is see sitting in the chair. He only has distress or pain whenever he moves around.   HEENT: Pupils are equally round and reactive to light. Extraocular movements are intact. Mucosa is actually moist.  Head  is normocephalic, atraumatic. Anicteric sclerae. Pink conjunctivae. No oral lesions.   NECK: Supple. No JVD, no thyromegaly, and no adenopathy. There is a scar for carotid endarterectomy of the left side of the neck and from previous C-spine surgery.   CARDIOVASCULAR: Irregularly irregular with systolic ejection murmur 1/6.   PULMONARY: Lungs are clear without any significant wheezing or crepitus. There is good bilateral air entrance.   ABDOMEN: Soft, tender in suprapubic and mesogastric area. There is no significant rebound or guarding. Bowel sounds are diminished diffusely. Negative CVA tenderness, but the patient keeps complaining of pain radiating to the back. There is no hepatic stigmata.   GENITOURINARY: Negative for external lesions.  EXTREMITIES: No significant edema, cyanosis, or clubbing. Pulses +2.   NEUROLOGIC: Cranial nerves II through XII are intact. No focal findings. DTRs +2. Strength 5/5 in upper extremities.  PSYCH: Mood is normal. Affect without any signs of agitation.   SKIN: No significant petechiae or skin lesions.   LABORATORY AND RADIOLOGIC DATA: Glucose 139, BUN 53, creatinine 6.83, sodium 131. Lipase 1500. Alkaline phosphatase 166. Liver enzymes are normal. Hemoglobin is 13.3 and white count 7. Calcium is 9.5. His platelet count is 120. Last Hemoglobin A1c was 4.6, last year. His platelets have been in the 120s since previous labs.   CT scan: There is a kidney lesion that has been unchanged since previous examination. There is significant pancreatitis and thickening of the sigmoid colon that has also been seen on a previous examination, recommended to have colonoscopy versus flexible sigmoidoscopy. Cannot rule out colitis.  ASSESSMENT AND PLAN:  1. Acute  pancreatitis. The patient has non-alcoholic pancreatitis. He also does not have a gallbladder for what is not a gallstone pancreatitis. I am going to go ahead and order a lipid panel to check his triglycerides. All  electrolytes are all within normal limits. His calcium is 9.5.  He is complaining of significant pain for which he is going to get IV medication, Dilaudid. He is going to be kept NPO with IV fluids. The challenge with this patient is his end-stage renal disease, on hemodialysis, for what we are going to start with gentle hydration, IV fluids of NS at 40 mL/hour. The patient is also diabetic. His blood sugars are mildly elevated this morning. We are going to monitor Accu-Cheks every 6 hours and avoid any type of hypoglycemia. If his blood sugar starts to come down, recommend to change fluids to D5 NS.  2. Other things to consider in the differential is that the patient has been severely constipation. That could cause some mild ileus and reflux on the Sphincter of Oddi or there is stenosis over the sphincter for what he might require an ERCP.  Gastroenterology has been consulted as well.  3. There are no signs of necrosis of the pancreas for what the patient is not requiring any prophylactic antibiotics.  4. Endstage renal disease. Try to arrange dialysis for today. Renal has been consulted.  5. Non-insulin-dependent diabetes. The patient is going to be NPO.  Risk of hypoglycemia but at this moment his blood sugars are a little high for what we are going to keep him on isotonic saline solution if necessary and Accu-Cheks are going to be monitored every 6 hours.  6. History of atrial fibrillation. The patient is on metoprolol for rate control for what we are going to change to IV at a lower dose with holding parameters.  7. Hypertension. As mentioned above, continue metoprolol.  8. Coronary artery disease. The patient is on aspirin daily. I think at this moment we are going to be able to hold aspirin and restart as soon as possible. I do not want to give a suppository since the patient has also coffee-ground emesis and we do not know if he will need to be scoped. Guaiacs are going to be ordered.  9. His other  medical problems seem to be stable. See past medical history.  10. For DVT prophylaxis, the patient is going to have pneumatic compression devices.  11. GI prophylaxis. We are going to put him on IV Protonix.   CODE STATUS: DO NOT RESUSCITATE.  ____________________________ Aredale Sink, MD rsg:slb D: 12/26/2011 09:39:34 ET     T: 12/26/2011 09:54:43 ET       JOB#: [id number] cc: Gilson Sink, MD, <Dictator> Heinz Knuckles. Blocker, MD Cristi Loron MD ELECTRONICALLY SIGNED 01/17/2012 13:05

## 2014-09-19 NOTE — Discharge Summary (Signed)
PATIENT NAME:  Robert [name], Robert [name] MR#:  [id number] DATE OF BIRTH:  May 17, 1941  DATE OF ADMISSION:  12/26/2011 DATE OF DISCHARGE:  12/30/2011  PRIMARY CARE PHYSICIAN: Dr. Clayborn Bigness  FINAL DIAGNOSES:  1. Abdominal pain with diarrhea and bloating, possible pancreatitis and musculoskeletal pain.  2. End-stage renal disease.  3. Hypertension.  4. Gastroesophageal reflux disease.  5. Benign prostatic hypertrophy.  6. Restless leg syndrome.  7. Hypothyroidism.  8. Hematuria.   MEDICATIONS ON DISCHARGE:  1. Avodart 0.5 mg 1 capsule daily.  2. Vitamin D3 1000 international units daily.  3. Omeprazole 40 mg daily.  4. Aspirin 81 mg twice a day. 5. Levoxyl 150 mg daily.  6. ProAir HFA 2 puffs every six hours. 7. Rena-Vite 1 tablet daily.  8. Co- Q10 1 tablet daily.  9. Lunesta 3 mg at bedtime.  10. Coreg 6.25 mg twice a day on nondialysis days, 3.125 mg twice a day on dialysis days.  11. Requip 0.5 mg 3 times a day.  NEW MEDICATIONS: 1. Prednisone taper 5 mg 3 tabs day one, 2 tabs day two, 1 tab day three and four.  2. Oxycodone 5 mg every six hours as needed for pain.  3. Soma 350 mg every eight hours as needed for muscle spasm pain.   DIET: Low sodium diet.   ACTIVITY: Activity as tolerated. Continue dialysis Monday, Wednesday, Friday. Follow up in 1 to 2 weeks with Dr. Clayborn Bigness, 2 to 4 weeks if needed with Dr. Candace Cruise of gastroenterology.   REASON FOR ADMISSION: Patient was admitted 12/26/2011, discharged 12/30/2011, came in with abdominal pain and elevated lipase found in the Emergency Room. Patient coming in with abdominal pain, constipation with occasional diarrhea, was admitted with suspected acute pancreatitis. The patient does not drink alcohol. Does not have a gallbladder. He was given IV fluids, n.p.o. and IV Dilaudid.   LABORATORY, DIAGNOSTIC, AND RADIOLOGICAL DATA: Hepatitis B surface antigen negative. LDL 95, HDL 43, triglycerides 73, magnesium 2.0, INR 1.1, glucose 139, BUN 53,  creatinine 6.83, sodium 131, potassium 4.6, chloride 99, CO2 21, calcium 9.5, alkaline phosphatase 166, ALT 25, AST 23, lipase 1501, white blood cell count 7.1, hemoglobin and hematocrit 11.3 and 32.1, platelet count 120. CT scan of the abdomen and pelvis showed prominent soft tissue in the medial aspect of the right kidney, indeterminate but stable from prior study, high attenuation exophytic lesion upper pole left kidney also intermediate and stable, diverticulosis of sigmoid colon, mild thickening of urinary bladder wall, nonspecific prominence of the sigmoid colon without surrounding evidence of inflammation. Urinalysis 2+ leukocyte esterase. PTH intact 298. Urine culture no growth. Stool culture no Salmonella, Shigella or Escherichia coli. White blood cells 0 to 5, white blood cells per immersion field. Stool for C. difficile was negative.   CONSULTS DURING THE HOSPITAL STAY:  1. Dr. Holley Raring, nephrology. 2. Dr. Candace Cruise, gastroenterology.   HOSPITAL COURSE PER PROBLEM LIST:  1. For the patient's abdominal pain, initially it was thought to be pancreatitis with his lipase of 1500. This improved to normal the next day. The patient was then started on a diet and the lipase then went up to 900 and started back on liquids and then normalized the next day again. Unclear what the etiology of this pancreatitis is. Patient did have pain in the abdomen but the pain was in the left lower quadrant and flank rather than in the epigastric area. The patient did have antibiotics, Augmentin a few months ago in the beginning of the month  for a sinus infection and has been having on and off diarrhea since then so it was a suspected C. difficile but that came back negative. Patient was empirically put on Flagyl but this was stopped prior to discharge. It was not until the patient was started on a Solu-Medrol trial and Soma that the patient's pain had totally disappeared so I do think the left lower quadrant pain is more  musculoskeletal. I do not see a rash. I do not think this is shingles. The patient did have a flexible sigmoidoscopy by Dr. Candace Cruise which showed no evidence of inflammation or diverticula. Flagyl was stopped upon discharge. Patient was given a prednisone taper, p.r.n. Soma and p.r.n. oxycodone if the pain resolves but the patient's pain was completely gone since the first Soma pill. Could be two processes going on; one musculoskeletal and another pancreatitis, but the pancreatic enzyme improved rapidly making this not a clear-cut pancreatitis. No etiology of the pancreatitis seen. Patient is not a drinker. Does not have a gallbladder. Triglycerides were normal. Is not on any medications that would cause it.  2. For his end-stage renal disease, he was dialyzed as per nephrology on his schedule.  3. For his hypertension, Coreg controlled.  4. For his gastroesophageal reflux disease, he was on IV Protonix here and omeprazole upon discharge.  5. For his benign prostatic hypertrophy, he is on Avodart once a week.  6. For his restless leg syndrome, he is on Requip.  7. For his hypothyroidism, he is on Levothyroxine.  8. For his hematuria he follows up with Dr. Jacqlyn Larsen as outpatient and had recent work-up.   TIME SPENT ON DISCHARGE: 35 minutes.   ____________________________ Tana Conch. Leslye Peer, MD rjw:cms D: 12/30/2011 14:15:58 ET T: 12/31/2011 11:06:12 ET JOB#: [id number]  cc: Tana Conch. Leslye Peer, MD, <Dictator> Piffard Blocker, MD Marisue Brooklyn MD ELECTRONICALLY SIGNED 01/08/2012 13:38

## 2014-09-19 NOTE — Consult Note (Signed)
Chief Complaint:   Subjective/Chief Complaint Signif abd pain after eating chicken pot pie yest. No diarrhea. No stool studies yet.   VITAL SIGNS/ANCILLARY NOTES: **Vital Signs.:   28-Jul-13 00:17   Vital Signs Type POCT   Nurse Fingerstick (mg/dL) FSBS (fasting range 65-99 mg/dL) 97   Comments/Interventions  Nurse Notified   Brief Assessment:   Cardiac Regular    Respiratory clear BS    Gastrointestinal Epig/LLQ tenderness   Assessment/Plan:  Assessment/Plan:   Assessment Abd pain, recurrent. Hx of diarrhea.    Plan Continue flagyl. Recheck lipase level. THanks.   Electronic Signatures: Verdie Shire (MD)  (Signed 28-Jul-13 09:22)  Authored: Chief Complaint, VITAL SIGNS/ANCILLARY NOTES, Brief Assessment, Assessment/Plan   Last Updated: 28-Jul-13 09:22 by Verdie Shire (MD)

## 2014-09-19 NOTE — Op Note (Signed)
PATIENT NAME:  Robert [name], CLASS MR#:  [id number] DATE OF BIRTH:  1941/02/24  DATE OF PROCEDURE:  02/10/2012  PREOPERATIVE DIAGNOSIS: Previously implanted pacemaker permanent, atrial fibrillation, bradycardia. Device now at end-of-life.   POSTOPERATIVE DIAGNOSIS: Previously implanted pacemaker permanent, atrial fibrillation, bradycardia. Device now at end-of-life.  PROCEDURE:  Explantation of previously implanted pacemaker pocket with insertion of a new pacemaker.   SURGEON:  Deboraha Sprang, MD.   DESCRIPTION OF PROCEDURE:  After obtaining informed consent, the patient was brought to the Operating Room and placed on the fluoroscopic table in the supine position. After routine prep and drape of the left upper chest, lidocaine was infiltrated just below the line of the previous incision and carried down to the layer of the device pocket. Using sharp dissection and electrocautery, the pocket was opened. The anchoring suture was released and removed and the device was explanted. Heme discoloration was noted in the ventricular lead. The ventricular lead was a Medtronic [id number] cm lead serial #[id number]. Interrogation demonstrated an amplitude of 5 to 8 mV with a pacing threshold of 0.5 at 0.5 ms. Impedance I do not have recorded, but was in the normal range, somewhere between 500 and 700 ohms. These measurements were taken following connection to the new pulse generator, which is a Medtronic Adapta RO1 serial [id number] where intermittent ventricular pacing was noted. The pocket was copiously irrigated with antibiotic containing saline solution. As noted, the pocket had been expanded to allow for housing of the new device. Hemostasis was obtained. The pocket was flushed and the leads of the pulse generator were placed in the pocket and secured to the prepectoral fascia. The wound was then closed in three layers in normal fashion. The wound was washed, dried and a benzoin and Steri-Strip dressing was  applied. Needle counts, sponge counts and instrument counts were correct at the end of the procedure according to the staff. The patient tolerated the procedure without apparent complication. ____________________________ Deboraha Sprang, MD sck:ap D: 02/10/2012 08:25:43 ET              T: 02/10/2012 10:50:15 ET                   JOB#: [id number] cc: Deboraha Sprang, MD, <Dictator> Deboraha Sprang MD ELECTRONICALLY SIGNED 03/30/2012 15:52

## 2014-09-19 NOTE — Consult Note (Signed)
Full consult to follow.Multiple medical problems. 2 wk hx of abd pain. Pain worse this AM. Evidence of pancreatitis by lab though pancreas looked normal on CT without contrast. Also, CT suggests colitis. Recent bouts of diarrhea. Last colon 2006.>15 yrs ago? More tender in LLQ abd area. GB out. No alcohol hx. Moniter lipase level over the weekend. If pancreatis resolves quickly, then full colonoscopy with bowel prep. Otherwise, consider flex sig with enemas Monday to evaluate sigmoid area. Will follow. Thanks.   Electronic Signatures: Verdie Shire (MD) (Signed on 26-Jul-13 15:26)  Authored   Last Updated: 26-Jul-13 15:29 by Verdie Shire (MD)

## 2014-09-19 NOTE — Op Note (Signed)
PATIENT NAME:  Robert [name], Robert [name] MR#:  [id number] DATE OF BIRTH:  Jan 06, 1941  DATE OF PROCEDURE:  05/11/2012  PREOPERATIVE DIAGNOSIS: Atherosclerotic occlusive disease bilateral lower extremities with lifestyle-limiting claudication left lower extremity.   POSTOPERATIVE DIAGNOSIS: Atherosclerotic occlusive disease bilateral lower extremities with lifestyle-limiting claudication left lower extremity.   PROCEDURES PERFORMED:  1. Abdominal aortogram.  2. Left lower extremity distal runoff, third order catheter placement   SURGEON: Hortencia Pilar, M.D.   SEDATION: Versed 5 mg plus fentanyl 150 mcg administered IV. Continuous ECG, pulse oximetry, and cardiopulmonary monitoring was performed throughout the entire procedure by the interventional radiology nurse. Total sedation time was one hour.   ACCESS: 6 French sheath, right common femoral artery.   CONTRAST USED: Isovue 98 mL.   FLUORO TIME: 13 minutes.   INDICATIONS: Robert [name] with multiple medical problems who presents to the office with increasing pain in his lower extremities. Noninvasive work-up as well as physical examination showed worsening of his atherosclerotic occlusive disease left lower extremity, worse on the right, and therefore is undergoing angiography with the possibility of intervention. Risks and benefits were reviewed. All questions answered. The patient has agreed to proceed.   DESCRIPTION OF PROCEDURE: The patient is taken to Special Procedures and placed in the supine position. After adequate sedation is achieved, ultrasound is placed in a sterile sleeve. Ultrasound is utilized secondary to lack of appropriate landmarks and to avoid vascular injury. Under direct ultrasound visualization, the common femoral artery is identified. It is echolucent and pulsatile, indicating patency. Image is recorded for the permanent record. Under real-time ultrasound guidance, the micropuncture needle is  inserted into the anterior wall of the common femoral artery, microwire followed by micro sheath, J-wire followed by 5 French sheath and 5 French pigtail catheter. The pigtail catheter is positioned at the level of T12 and AP projection of the aorta is obtained. The pigtail catheter is then repositioned to above the bifurcation and bilateral oblique views of the pelvis are obtained. A stiff angled Glidewire and pigtail catheter are used to cross the bifurcation and the catheter is advanced down into the deep femoral artery on the left. Distal runoff is then obtained.   After review of the images, attempts at crossing the SFA occlusion are performed. A 6 [place] Raabe sheath is advanced up and over the bifurcation. Unfortunately, negotiating the previously placed SFA stent was not feasible. Distal runoff is obtained, representing third-order catheter placement, and the sheath is pulled back into the right external iliac artery. Oblique view is obtained and a Mynx device is deployed without difficulty. There are no immediate complications.   INTERPRETATION: The abdominal aorta is opacified with a bolus injection of contrast. It is diffusely diseased but there are no hemodynamically significant stenoses. The right common iliac artery demonstrates a 30% stenosis. The left common is widely patent. External iliac arteries are patent bilaterally as are the internal.   The left common femoral is heavily diseased with hemodynamically significant 60% stenosis distally. There is a greater than 90% stenosis at the origin of the deep femoral on the left. SFA has flush occlusion. At [facility] canal just below the level of the stent it reconstitutes and there appears to be three-vessel runoff to the foot, although distally there is small vessel disease in the ankle.   SUMMARY: Significant atherosclerotic occlusive disease of left lower extremity including the common femoral and the origin of the profunda femoris as well as  occlusion of the SFA. This  is a poor candidate for interventional techniques and would be better suited to open surgical repair with a femoral endarterectomy combined with a femoral- above-knee popliteal bypass.    ____________________________ Katha Cabal, MD ggs:bjt D:  05/11/2012 16:34:04 ET          T: 05/11/2012 17:05:34 ET          JOB#: [id number]  cc: Katha Cabal, MD, <Dictator> Heinz Knuckles. Blocker, MD Katha Cabal MD ELECTRONICALLY SIGNED 05/19/2012 16:23

## 2014-09-19 NOTE — Consult Note (Signed)
Chief Complaint:   Subjective/Chief Complaint Had clears before 9AM this AM. Went to HD and returned before noon. Still with abd bloating and LLQ discomfort. Lipase back to normal.   VITAL SIGNS/ANCILLARY NOTES: **Vital Signs.:   29-Jul-13 11:44   Temperature Temperature (F) 97.6   Celsius 36.4   Pulse Pulse 98   Respirations Respirations 19   Systolic BP Systolic BP 213   Diastolic BP (mmHg) Diastolic BP (mmHg) 66   Mean BP 98   Brief Assessment:   Cardiac Regular    Respiratory clear BS    Gastrointestinal LLQ abd tenderness   Lab Results: Hepatic:  29-Jul-13 04:21    Bilirubin, Total 0.6   Alkaline Phosphatase  159   SGPT (ALT) 24 (12-78 NOTE: NEW REFERENCE RANGE 04/25/2011)   SGOT (AST) 22   Total Protein, Serum 6.6   Albumin, Serum 3.5  Routine Chem:  29-Jul-13 04:21    Lipase 147 (Result(s) reported on 29 Dec 2011 at 05:05AM.)   Glucose, Serum  100   BUN  63   Creatinine (comp)  8.76   Sodium, Serum  131   Potassium, Serum 4.5   Chloride, Serum  94   CO2, Serum 25   Calcium (Total), Serum 8.8   Osmolality (calc) 281   eGFR (African American)  6   eGFR (Non-African American)  5 (eGFR values <31mL/min/1.73 m2 may be an indication of chronic kidney disease (CKD). Calculated eGFR is useful in patients with stable renal function. The eGFR calculation will not be reliable in acutely ill patients when serum creatinine is changing rapidly. It is not useful in  patients on dialysis. The eGFR calculation may not be applicable to patients at the low and high extremes of body sizes, pregnant women, and vegetarians.)   Anion Gap 12   Assessment/Plan:  Assessment/Plan:   Assessment colitis?    Plan Arrange flexible sigmoidoscopy this afternoon.   Electronic Signatures: Verdie Shire (MD)  (Signed 29-Jul-13 13:07)  Authored: Chief Complaint, VITAL SIGNS/ANCILLARY NOTES, Brief Assessment, Lab Results, Assessment/Plan   Last Updated: 29-Jul-13 13:07 by Verdie Shire  (MD)

## 2014-09-19 NOTE — Op Note (Signed)
PATIENT NAME:  Robert [name], Robert [name] MR#:  [id number] DATE OF BIRTH:  August 15, 1940  DATE OF PROCEDURE:  04/13/2012  PREOPERATIVE DIAGNOSIS:  1. Complication dialysis device with poor flows and difficulty with cannulation.  2. End-stage renal disease requiring hemodialysis.  3. Cardiac arrhythmia.   POSTOPERATIVE DIAGNOSIS:  1. Complication dialysis device with poor flows and difficulty with cannulation.  2. End-stage renal disease requiring hemodialysis.  3. Cardiac arrhythmia.   PROCEDURE PERFORMED:  1. Contrast injection left arm brachial axillary dialysis graft.  2. Percutaneous transluminal angioplasty venous portion left arm brachial axillary dialysis graft.   3. Percutaneous transluminal angioplasty of the arterial portion AV graft, left arm.   SURGEON: Katha Cabal, MD   SEDATION: Versed 4 mg plus fentanyl 100 mcg administered IV. Continuous ECG, pulse oximetry and cardiopulmonary monitoring is performed throughout the entire procedure by the Interventional Radiology nurse. Total sedation time is 45 minutes.   ACCESS: 6-French sheath left arm AV graft, antegrade direction.   CONTRAST USED: Isovue 25 mL.   FLUOROSCOPY TIME:  1.2 minutes.   INDICATIONS: Robert [name] is a 74 year old gentleman maintained on hemodialysis via a left arm brachial axillary dialysis graft. Lately the Dialysis Center has had difficulty accessing the graft, particularly the arterial needle, and they have noted significant reduction in the flow. The risks and benefits for imaging and possible intervention were reviewed. All questions are answered. The patient agrees to proceed.   DESCRIPTION OF PROCEDURE: The patient is taken to Special Procedures and placed in the supine position with his left arm extended palm upward, and after adequate sedation the left arm is prepped and draped in sterile fashion. One percent lidocaine is infiltrated in the soft tissues as close to the arterial anastomosis as  feasible, and a micropuncture needle is used to access the graft without difficulty, a microwire followed by a micro sheath, J-wire followed by a 6 [place] sheath.   Hand injection of contrast is then used to demonstrate the AV graft as well as the central venous anatomy. After review of these images, 3000 units of heparin is given. A Magic Torque Wire is then advanced through the graft through the previously stented segment and into the central venous anatomy. An 8 x 4 balloon is then selected, and at the venous anastomotic area at the leading edge of the FLAIR stent approximately a 60% narrowing is noted, and this is angioplastied to 14 atmospheres for one minute. A second inflation is also made after adjusting the balloon slightly at this location. With the balloon inflated, reflux imaging is then obtained and a greater than 80% narrowing of the arterial portion is identified. The brachial visualized portions of the brachial artery are widely patent.   The sheath is then adjusted slightly. The balloon is repositioned to the arterial lesion, and angioplasty is performed to 14 atmospheres for two minutes. Follow-up angiography demonstrates complete resolution of both areas of narrowing with maintaining patency of the central venous anatomy.   A pursestring suture of 4-0 Monocryl is placed, the sheath is pulled, and light pressure is held. There are no immediate complications.   INTERPRETATION: Images are obtained of the left brachial axillary dialysis graft. There are two previously placed stents. The FLAIR stent is crossing the actual anastomosis. The proximal edge of the FLAIR stent within the vein is widely patent as is the axillary and central veins. There is mild narrowing secondary to the pacemaker wires. The leading edge of the FLAIR stent demonstrates a high-grade stenosis.  The stent across the venous portion of the graft is patent. The arterial portion demonstrates two small pseudoaneurysms and a  high-grade greater than 80% stenosis. Following angioplasty to 8 mm of both lesions, there is now wide patency and improved flow. As noted above, the visualized portions of the brachial artery are widely patent.  ____________________________ Katha Cabal, MD ggs:cbb D: 04/13/2012 11:36:43 ET T: 04/13/2012 12:09:36 ET JOB#: [id number] Dolores Lory Ghali Morissette MD ELECTRONICALLY SIGNED 04/13/2012 17:15

## 2014-09-19 NOTE — Consult Note (Signed)
Flexible sigmoidoscopy to proximal transverse colon showed moderate tics involving sigmoid colon but no evidence of colitis or diverticulitis anywhere. Stool suctioned for c.diff. Low fat diet ordered. Will eventually need high fiber diet. I will be out tomorrow but will check back on Wed. Thanks.  Electronic Signatures: Verdie Shire (MD)  (Signed on 29-Jul-13 17:05)  Authored  Last Updated: 29-Jul-13 17:05 by Verdie Shire (MD)

## 2014-09-19 NOTE — Consult Note (Signed)
Chief Complaint:   Subjective/Chief Complaint No abd pain today. lipase back to normal. Some diarrhea this AM.   VITAL SIGNS/ANCILLARY NOTES: **Vital Signs.:   27-Jul-13 13:49   Vital Signs Type Routine   Temperature Temperature (F) 97.7   Celsius 36.5   Temperature Source Oral   Pulse Pulse 73   Respirations Respirations 20   Systolic BP Systolic BP 671   Diastolic BP (mmHg) Diastolic BP (mmHg) 56   Mean BP 87   Pulse Ox % Pulse Ox % 94   Pulse Ox Activity Level  At rest   Oxygen Delivery Room Air/ 21 %   Brief Assessment:   Cardiac Regular    Respiratory clear BS    Gastrointestinal Normal   Radiology Results: CT:    26-Jul-13 07:14, CT Abdomen and Pelvis Without Contrast   CT Abdomen and Pelvis Without Contrast    REASON FOR EXAM:    (1) generalized abdominal pain and nausea; (2)   generalized abdominal pain and na  COMMENTS:       PROCEDURE: CT  - CT ABDOMEN AND PELVIS W0  - Dec 26 2011  7:14AM     RESULT: Comparison is made to a prior study dated 08/07/2011.    Technique: Helical noncontrasted 3 mm sections were obtained from the   lung bases through the pubic symphysis.    Findings: Mild interstitial and emphysematous changes are identified   within the lung bases. Multichambered cardiac enlargement is identified   as well as coarse coronary artery calcifications.    Evaluation of the liver demonstrates a reticular parenchymal appearance   with mild nodularity along the border. The spleen, adrenals and pancreas   are unremarkable.    When compared to the previous study, a prominent soft tissue appearing   masslike area is identified within the medial aspect of the right kidney   which is indeterminate and stable. This area contains calcifications.   Small high-attenuating exophytic lesion is identified in the upper pole   of the left kidney which is also indeterminate and stable. There is mild   thickening of the urinary bladder wall more prominent  anteriorly. There   is no evidence of hydronephrosis, hydroureter or ureterolithiasis. There   is no CT evidence of bowel obstruction, enteritis, colitis or   diverticulitis. There is diverticulosis within the sigmoid colon. There   is no evidence of an abdominal aortic aneurysm. The urinary bladder is   partially contracted.   IMPRESSION:      1. Prominent soft tissue in the medial aspect of the right kidney which   is indeterminate and stable from prior study.   2. Small high-attenuating exophytic lesion upper pole left kidney also   indeterminate and stable. Further evaluation with ultrasound may help to   further characterize these lesions or possibly MRI.  2. Diverticulosis in the sigmoid colon.   3. Mild thickening of the urinary bladder wall. Further evaluation with   direct visualization recommended if clinically warranted.   4. There is nonspecific prominence of the sigmoid colon without   surrounding evidence of inflammation. Clinical correlation of this   finding is recommended.   5. Dr. Owens Shark of the Emergency Department was informed of these findings   via a preliminary faxed report.  Thank you for the opportunity to contribute to the care of your patient.           Verified By: Mikki Santee, M.D., MD   Assessment/Plan:  Assessment/Plan:  Assessment Abd pain. Resolved. Wonder if patient truly had pancreaatitis. Diarrhea this AM.    Plan Agree with stool studies to check for c.diff. Agree with flagyl. If patient has c.diff, treat and hold off on flex sig/colon.   Electronic Signatures: Verdie Shire (MD)  (Signed 27-Jul-13 14:47)  Authored: Chief Complaint, VITAL SIGNS/ANCILLARY NOTES, Brief Assessment, Radiology Results, Assessment/Plan   Last Updated: 27-Jul-13 14:47 by Verdie Shire (MD)

## 2014-09-22 NOTE — Op Note (Signed)
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   PROCEDURES PERFORMED:  1.  Left profunda femoris, superficial femoral and common femoral endarterectomy with CorMatrix patch angioplasty.  2.  Repair of arterial defect with xenograft patch, CorMatrix brand.   SURGEON: Katha Cabal, MD  FIRST ASSISTANT: Algernon Huxley, MD  ANESTHESIA: General by endotracheal intubation.   FLUIDS: Per anesthesia record.   ESTIMATED BLOOD LOSS: 300 mL.   SPECIMEN: Plaque from the femoral arteries to pathology for permanent section.   INDICATIONS: The patient is a 74 year old gentleman who presents with worsening ischemic rest pain and development of ischemic fissures of the left foot. He has undergone angiography with attempts for revascularization; however, these were unsuccessful. He has extensive common femoral plaque formation and now has occlusion of his profunda femoris as well as his superficial femoral, yielding the significant ischemia. The risks and benefits for femoral endarterectomy were reviewed. All questions were answered. The patient agrees to proceed.   DESCRIPTION OF PROCEDURE: The patient is taken to the Operating Room and placed in the supine position. After adequate general anesthesia is induced and appropriate invasive monitors are placed, he is positioned supine. He is then prepped and draped from about the nipple line to the knees on the left side. Appropriate timeout is called. Appropriate invasive monitors were placed after anesthesia.   A vertical incision is then made one third of the distance between the symphysis pubis and the  iliac crest and the dissection is carried down through the soft tissues. Lymphatics and veins are ligated with silk ties as needed. Femoral sheath is then opened. The common femoral artery is then dissected circumferentially, beginning just above the level of the ilioinguinal ligament, and the dissection is carried down to the femoral bifurcation. The profunda femoris is then dissected further until the tertiary branches are exposed and individually looped. Heparin 4000 units is given. Arteriotomy is made and extended with Potts scissors. The plaque in the common femoral and superficial femoral artery is then treated with direct endarterectomy. The plaque within the profunda femoris is treated with the eversion technique down to the tertiary division of the branches.   All branches of the profunda were then flushed with heparinized saline and a CorMatrix patch, which was rehydrated on the back table, is trimmed to an appropriate bevel and applied to repair the arterial defect with running 5-0 Prolene. Flushing maneuvers are performed and flow is re-established to the profunda femoris.   The wound is then irrigated with 500 mL of saline. Fibrillar is then placed along the suture line and 5 mL of Evicel is used. The wound is then closed in multiple layers using 2-0 followed by several layers of 3-0 Vicryl in a running fashion. Skin is reapproximated with 4-0 Monocryl subcuticular. Dermabond is applied. The patient tolerated the procedure well and there were no immediate complications. Sponge and needle counts are correct and he is taken to the recovery area in stable condition.   ____________________________ Katha Cabal, MD ggs:jm D: 06/30/2012 17:20:20 ET T: 06/30/2012 18:01:53 ET JOB#: [id number]  cc: Katha Cabal, MD, <Dictator> Heinz Knuckles. Blocker, MD Katha Cabal MD ELECTRONICALLY SIGNED 07/19/2012 23:25

## 2014-09-22 NOTE — Discharge Summary (Signed)
PATIENT NAME:  Robert [name], Robert [name] MR#:  [id number] DATE OF BIRTH:  1941/05/17  DATE OF ADMISSION:  07/20/2012 DATE OF DISCHARGE:  07/28/2012  ADMITTING DIAGNOSES: Sepsis with fever, leukocytosis and tachycardia as well as hypotension.  DISCHARGE DIAGNOSES:  1.  Systemic antiinflammatory response reaction. 2. Methicillin-sensitive Staphylococcus aureus as well as Enterobacter aerogenes, left groin cellulitis and abscess, status post debridement of left groin wound and placement of wound VAC on the 19th of February 2014 by Dr. Delana Meyer.   3.  Atrial fibrillation, rapid ventricular response.  4.  Altered mental status, due to Dilaudid. 5.  History of chronic anemia, cardiomyopathy. 6.  Hypothyroidism. 7.  Gastroesophageal reflux disease.  8.  Diabetes mellitus.  9.  End-stage renal disease, hemodialysis Mondays, Wednesdays and Fridays.  10.  Left upper extremity graft. 11.  History of coronary artery disease, status post coronary artery bypass grafting. 12.  Chronic back pain, spinal stenosis, wheelchair-bound.  13.  Chronic obstructive pulmonary disease.  14.  Chronic respiratory failure, on 2 liters of oxygen through nasal cannula at home. 15.  Benign prostatic hypertrophy. 16.  Bladder cancer. 81.  Parkinson's disease. 18.  Hypertension. 19.  Hyperlipidemia.   DISCHARGE CONDITION: Stable.   DISCHARGE MEDICATIONS: The patient is to continue his outpatient medications which are:  1.  Omeprazole 40 mg p.o. once daily. 2.  Levoxyl 150 mcg p.o. daily. 3.  Probiotic 1 tablet once daily. 4.  Soma 350 mg p.o. 1 tablet at bedtime.  5.  Nitrostat 0.4 mg sublingually every 5 minutes as needed.  6.  Aspirin 81 mg p.o. twice daily.  7.  Gabapentin 100 mg p.o. 3 times daily.  8.  Midodrine 5 mg p.o. every 4 hours as needed.  9.  Rena-Vite 1 tablet once daily.  10.  Pro Air HFA 2 puffs 4 times daily as needed.  11.  Procrit 3000 units which is 2 mL twice weekly at dialysis.  12.  Calcium  acetate 667 mg 3 tablets 3 times daily with meals; 2 tablets with breakfast, 3 tablets with lunch as well as dinner and 1 tablet with snacks.  13.  Requip 5 mg 3 times a day.  14.  B complex multivitamins 1 tablet once daily.  15.  Coenzyme Q10 100 mg 2 tablets once daily.  16.  Vitamin D3 2000 units once daily.  17.  Finasteride 5 mg p.o. daily.  18.  Metoprolol tartrate 25 mg 1/2 tablet twice daily.  19.  Acetaminophen/hydrocodone 325/10 mg 2 tablets every 6 hours as needed.  20.  Aspirin 81 mg p.o. daily.  21.  Tiotropium 1 inhalation once daily.  22.  Albuterol ipratropium 1 inhalation 4 times daily.  23.  Emollients topical lotion twice daily.  24.  Gentamicin with each hemodialysis, 3 x a week.  25.  Cefazolin 2 grams twice a week and 3 grams once a week. The antibiotic should be used 4 weeks from 19th of February 2014. 26.  Prednisone 10 mg tablets, 5 tablets once a day, on the 27th of February 2014 then taper by 10 mg daily until stopped.   CONSULTANTS: Dr. Delana Meyer, Dr. Lucky Cowboy, Dr. Anthonette Legato, care management, Dr. Clayborn Bigness, Dr. Mortimer Fries, Dr. Candiss Norse.   RADIOLOGIC STUDIES: Chest portable single view, 18th of February 2014, showed findings suggesting low-grade CHF. No focal pneumonia. Followup PA and lateral would be of value, according to radiologist.   Chest x-ray PA and lateral, 23rd of February 2014, revealed increased density adjacent to left heart  border worrisome for atelectasis or pulmonary vascular congestion. Repeat films with better inspiration and more AP positioning are recommended.  Ultrasound of left lower extremity, 19th of February 2014, showed no evidence of DVT, in left lower extremity. Large complex area in left groin measuring 5.6 x 3.6 x 8.6 cm with no internal Doppler flow, which may represent hematoma versus thrombosed pseudoaneurysm. Repeated Doppler ultrasound, 24th of February 2014, revealed no evidence of DVT, in left lower extremity, however, common femoral vein  could not be evaluated. Large hyperechoic collection, in the left groin, similar to recent prior. Nonspecific and differential would include hematoma and abscess. Followup is recommended to ensure resolution and exclude other etiologies.   HISTORY AND HOSPITAL COURSE:  The patient is a 74 year old male with history of peripheral vascular disease who underwent left femoral artery endarterectomy by Dr. Delana Meyer a few weeks prior to coming back to the hospital with left lower extremity swelling as well as pain and serosanguineous drainage since his procedure. Please refer to Dr. Wyatt Portela admission note on the 18th of February 2014. On arrival to the hospital, the patient was noted to be febrile. He had elevated white blood cell count and his wound, in the left groin, looked open with infection and had foul-smelling discharge and he was admitted for sepsis.  The patient's labs on arrival to the hospital, 18th of February 2014, showed elevated B-type natriuretic peptide of 33,403. Glucose was 184. BUN and creatinine were 44 and 7.47. Sodium was 134. Potassium was 4.7. Otherwise BMP was unremarkable. The patient's liver enzymes revealed albumin level of 2.9 and alkaline phosphatase 203, otherwise liver enzymes were unremarkable. Cardiac enzymes x 3 were within normal limits. The patient's white blood cell count was elevated to 17.4, hemoglobin was 9.5, platelet count 171 and absolute neutrophil count was elevated at 14.6. Blood cultures taken on the 18th of February 2014 showed no growth. Wound culture taken on 19th of February 2014, in left groin, revealed Staphylococcus aureus, heavy growth, resistant to clindamycin as well as erythromycin, however, sensitive to all other antibiotics. Also it grew Enterobacter aerogenes, heavy growth, resistant to cefazolin as well as intermittently to ceftazidime as well as resistant to cefoxitin; however, it was sensitive to all other antibiotics.   The patient was evaluated  by Dr. Delana Meyer, on the 19th of February 2014, and taken to the operating room where he had debridement of left groin and placement of wound VAC, done on the 19th of February 2014. Intraoperative wound cultures, of left groin, also showed light growth of Enterobacter aerogenes as well as Staphylococcus aureus. The same sensitivities.  The patient was initiated on broad-spectrum antibiotics with vancomycin as well as with Zosyn; however, later on when the patient's wound cultures came back vancomycin was discontinued. The patient was counseled by Dr. Clayborn Bigness who followed the patient along while in the hospital. With this therapy, the patient's condition improved. His white blood cell count normalized already by the 21st of February 2014. It was felt that the patient should continue antibiotic therapy for 4 weeks since initiating antibiotic therapy, from the 19th of February 2014.  This was communicated to Dr. Smith Mince, outpatient nephrologist who is going to institute antibiotic therapy with gentamicin as well as cefazolin as outpatient during hemodialysis sessions. According to Dr. Clayborn Bigness, the patient's left groin graft infection, due to grafting with Xenograft, would not need to be removed to obtain cure; however, the patient will need 4 weeks of IV therapy for presumed graft infection with cefazolin  as well as gentamicin which would cover both organisms, Enterobacter as well as staphylococcus, and allow him to get those during hemodialysis. The patient is to follow up however with Dr. Clayborn Bigness in the next 1 week after discharge as well as Dr. Delana Meyer or Dr. Lucky Cowboy for wound VAC changes. The patient was also arranged home health RN for wound VAC changes at home as well.   On arrival to the hospital, the patient was noted to be in A-fib, RVR. It was felt to be due to underlying infection. The patient was continued on metoprolol and his heart rate improved with therapy. On the day of discharge, the patient's heart rate is  ranging between 90s to 100s. The patient is not on any anticoagulation for atrial fibrillation due to anemia, transfusion dependent.   In regards to end-stage renal disease, the patient is on hemodialysis Mondays, Wednesdays and Fridays. He is followed by [facility]. Last hemodialysis was on the 24th of February 2014, next on the 27th of 2014. The patient is to continue antibiotics with hemodialysis.   In regards to coronary artery disease, status post coronary artery bypass grafting, the patient is to continue aspirin therapy.   The patient was noted to be in altered mental status, had metabolic encephalopathy due to infection as well as Dilaudid which was given for pain; however, after discontinuation of Dilaudid the patient's altered mental status resolved.   For hypothyroidism, the patient is to continue Synthroid.  For gastroesophageal reflux disease, continue PPI.  For diabetes mellitus, the patient is to continue hemodialysis diet as well as sliding scale insulin and diabetic diet.   For left lower extremity edema, we checked a few times for possible deep vein thrombosis; however, there was no deep vein thrombosis. It was felt that the patient's continued swelling as well as pain of his left lower extremity was very likely due to poorly functioning wound VAC.  However, after changing wound VAC, the patient's lower extremity swelling has improved. The patient is being discharged to home after he was evaluated by physical therapy. Home health RN and physical therapy as well as follow up with primary care physician, Dr. Clayborn Bigness, will be arranged for him upon discharge. The patient is being discharged in stable condition with the above-mentioned medications and followup. His vital signs on the day of discharge: Temperature is 97.3, pulse was 89 to 90s to 100s, respiration rate was 18 to 20, blood pressure 130/60s, and oxygen saturation 93% to 95% on 2 liters of oxygen through nasal cannula at  rest.   TIME SPENT:  40 minutes. ____________________________ Theodoro Grist, MD rv:sb D: 08/01/2012 13:03:52 ET T: 08/02/2012 07:41:10 ET JOB#: [id number]  cc: Theodoro Grist, MD, <Dictator> RIMA VAICKUTE MD ELECTRONICALLY SIGNED 09/01/2012 15:18

## 2014-09-22 NOTE — Op Note (Signed)
PATIENT NAME:  Robert [name], Robert [name] MR#:  [id number] DATE OF BIRTH:  01-31-1941  DATE OF PROCEDURE:  07/21/2012  PREOPERATIVE DIAGNOSES:   1.  Infected left groin wound with wound dehiscence.  2.  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   POSTOPERATIVE DIAGNOSES:   1.  Infected left groin wound with wound dehiscence.  2.  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   PROCEDURE PERFORMED:  Excisional debridement of left groin wound.   SURGEON:  Katha Cabal, MD  ANESTHESIA:  General by LMA.   FLUIDS:  Per anesthesia record.   ESTIMATED BLOOD LOSS:  Minimal.   SPECIMEN:  Wound culture from the upper aspect in the deep layer.   INDICATIONS:  The patient is a 74 year old gentleman who presented to the hospital with signs and symptoms consistent with sepsis. He subsequently was found to have wound dehiscence with drainage, but not significant cellulitic changes and no frank purulence; however, clearly, this wound is in need of debridement and wound care. Risks and benefits for surgical debridement and placement of a wound VAC were reviewed with the patient. All questions are answered. The patient agrees to proceed.   DESCRIPTION OF PROCEDURE:  The patient is taken to the operating room and placed in the supine position. After adequate general anesthesia induced and appropriate invasive monitors are placed, he is positioned supine. The left groin is prepped and draped in a sterile fashion. Using a #10 blade scalpel, the necrotic skin, necrotic fat and subcutaneous tissues are debrided. The depth of the  debridement is approximately 3 cm. The area of the debridement is 12 cm x 4 cm. The arterial repair is still covered with intact fascial tissue and no frank purulence was noted. Minimal deeper necrotic subcutaneous tissues were identified, this was all debrided and once the necrotic tissue and skin had been excised completely, the wound was irrigated with GU irrigant and a VAC wound was placed. The patient tolerated the procedure well and there were no immediate complications.    ____________________________ Katha Cabal, MD ggs:si D: 07/21/2012 20:23:41 ET T: 07/21/2012 22:23:53 ET JOB#: [id number]  cc: Katha Cabal, MD, <Dictator> Katha Cabal MD ELECTRONICALLY SIGNED 07/27/2012 11:24

## 2014-09-22 NOTE — Op Note (Signed)
PATIENT NAME:  Robert [name], Robert [name] MR#:  [id number] DATE OF BIRTH:  1941/01/23  DATE OF PROCEDURE:  11/04/2012  PREOPERATIVE DIAGNOSIS:  Osteomyelitis, left second toe.   POSTOPERATIVE DIAGNOSIS:  Osteomyelitis, left second toe.   PROCEDURE:  Amputation, left second toe.   SURGEON:  Durward Fortes, DPM.   ANESTHESIA:  Local MAC.   HEMOSTASIS:  None.   ESTIMATED BLOOD LOSS:  5 to 10 mL.   PATHOLOGY:  Left second toe.   CULTURES:  Bone cultures distal phalanx, left second toe.   MATERIALS:  None.   COMPLICATIONS:  None apparent.   OPERATIVE INDICATIONS:  This is a 74 year old male with a history of peripheral vascular disease, recently having undergone angioplasty on the left lower extremity. He has developed some gangrenous changes to the tip of his left second toe which developed into exposed bone at the distal phalanx. Decision was made for amputation of the toe as this was most likely his most optimal time for healing.   OPERATIVE PROCEDURE:  The patient was taken to the Operating Room and placed on the table in a supine position. Following satisfactory sedation, the left foot was anesthetized with 10 mL of 0.5% Sensorcaine plain around the second metatarsal shaft and forefoot. The foot was then prepped and draped in the usual sterile fashion. Attention was then directed to the distal aspect of the left foot where an elliptical incision was made coursing dorsal to plantar around the base of the left second toe. The incision was carried sharply down to the level of the bone and dissection carried back periosteally to the level of the joint where the toe was disarticulated and removed in toto. There was noted to be healthy granular tissues. There was not an excessive amount of bleeding even though there was no tourniquet. The wound was flushed with copious amounts of sterile saline and closed using 4-0 Vicryl vertical mattress sutures for deep closure followed by 4-0 nylon simple  interrupted sutures for skin closure. Xeroform and a sterile bandage were applied to the left foot followed by an ABD, Kerlix, and an Ace wrap. The patient tolerated the procedure and anesthesia well and was transported to PACU with vital signs stable and in good condition.   ____________________________ Sharlotte Alamo, DPM tc:jm D: 11/04/2012 14:26:41 ET T: 11/04/2012 15:52:04 ET JOB#: [id number]  cc: Sharlotte Alamo, DPM, <Dictator> Katha Cabal, MD Zissel Biederman DPM ELECTRONICALLY SIGNED 11/11/2012 10:39

## 2014-09-22 NOTE — Consult Note (Signed)
PATIENT NAME:  Robert [name], Robert [name] MR#:  [id number] DATE OF BIRTH:  [birth year]  DATE OF CONSULTATION:  07/01/2012  REFERRING PHYSICIAN:  Katha Cabal, MD CONSULTING PHYSICIAN:  Christionna Poland A. Posey Pronto, MD  PRIMARY CARE PHYSICIAN: [place] Blocker, MD   HEMATOLOGIST: Simonne Come. Inez Pilgrim, MD  NEPHROLOGIST: Trinda Pascal, MD, [facility] nephrology.   REASON FOR CONSULTATION: Postoperative medical management.   HISTORY OF PRESENT ILLNESS: The patient is a pleasant 74 year old Caucasian gentleman with multiple medical problems who was admitted, underwent left common femoral, profunda femoris and superficial femoral endarterectomy and CorMatrix patch angioplasty by Dr. Delana Meyer on 06/30/2012 secondary to his symptoms of severe occlusive peripheral arterial disease. The patient is in the CCU, was on esmolol drip, which has been discontinued. He is hemodynamically stable. Internal medicine was consulted for postoperative management of medical problems.    The patient has history of chronic anemia. Follows up with Dr. Inez Pilgrim as outpatient. Received 3 units of blood transfusion during and after surgery, and his hemoglobin is still down to 7.5. He will be receiving 1 more unit of blood transfusion. The patient is currently getting hemodialysis. His potassium is 6.3. He denies any complaints other than mild pain on his surgical left lower extremity.   PAST MEDICAL HISTORY:  1. Chronic anemia. Follows up with Dr. Inez Pilgrim as outpatient. He gets Procrit and IV ferriheme as outpatient.  2. Chronic AFib.   3. Chronic cardiomyopathy.  4. Chronic back pain due to lumbar spinal stenosis. The patient is bedbound and wheelchair bound.  5. End-stage renal disease, on hemodialysis.  6. COPD, on home oxygen at bedtime.  7. Impaired hearing.  8. Type 2 diabetes.  9. Enlarged prostate/BPH.  10. History of bladder cancer.  11. Parkinson's disease.  12. Hyperlipidemia.  13. Hypertension.   PAST SURGICAL HISTORY:  1. Left arm  AV graft.  2. Cervical spine repair.  3. Carotid endarterectomy.  4. Thyroid irradiation due to Graves disease.  5. Nasal surgery.  6. Stent in the left leg.  7. Bladder surgery secondary to bladder cancer.  8. Pacemaker placement.  9. Cholecystectomy.  10. CABG.   CURRENT HOME MEDICATIONS:  1. Vitamin D3 2000 international units daily.  2. Requip 5 mg p.o. 3 times a day.  3. Rena-Vite 1 tablet daily.  4. Procrit 3000 units twice a week at dialysis.  5. Probiotic-4 one tablet daily in the morning.  6. ProAir HFA 2 puffs inhaled 4 times a day.  7. Omeprazole 40 mg daily.  8. Nitrostat 0.4 mg sublingual as needed.  9. Midodrine 5 mg every 4 hours as needed for nausea and vomiting.  10. Levoxyl 150 mcg p.o. daily.  11. Gabapentin 100 mg 3 times a day.  12. Finasteride 5 mg daily.  13. CoQ-10 100 mg 2 capsules p.o. daily.  14. Carisoprodol 350 mg p.o. at bedtime.  15. Calcium acetate 667 mg 3 tablets 3 times a day, which is divided into 2 at breakfast, 3 at lunch and dinner and 1 with snacks.  16. B complex 50 p.o. daily.  17. Aspirin 81 mg twice a day.   ALLERGIES: CIPRO, CELEBREX, MORPHINE, CONTRAST, IODINATED CONTRAST, MEVACOR, NIACIN.   SOCIAL HISTORY: Lives at home with wife. He is wheelchair-bound. Nonsmoker.   FAMILY HISTORY: Positive for hypertension and heart disease.   REVIEW OF SYSTEMS:   CONSTITUTIONAL: No fever, fatigue, weakness.  EYES: No blurred or double vision.  ENT: No tinnitus, ear pain. Impaired hearing. RESPIRATORY: No cough, wheeze, hemoptysis.  CARDIOVASCULAR: No chest pain, orthopnea, edema. Positive for hypertension.  GASTROINTESTINAL: No nausea, vomiting, diarrhea, abdominal pain, no GERD, no melena.  GENITOURINARY: No dysuria, hematuria. Positive for hypothyroidism.   MUSCULOSKELETAL: Positive for arthritis and back pain due to lumbar spinal stenosis.  NEUROLOGIC: No CVA, TIA. Positive for peripheral neuropathy.  SKIN: No acne or rash.   PSYCHIATRIC: No anxiety or depression.  All other systems reviewed and negative.   PHYSICAL EXAMINATION:  GENERAL: The patient is awake, alert, oriented x3, getting dialysis.  VITAL SIGNS: Afebrile, pulse is 90, blood pressure 114/49 HEENT: Atraumatic, normocephalic. PERRLA. EOMI intact. Oral mucosa is moist.  NECK: Supple. No JVD. No carotid bruit.  RESPIRATORY: Clear to auscultation bilaterally. No rales, rhonchi, respiratory distress or labored breathing.  CARDIOVASCULAR: Both the heart sounds are normal. Rate and rhythm regular. PMI not lateralized.  CHEST: Nontender.  EXTREMITIES: Feeble pedal pulses, good femoral pulses on the right.  SKIN: Appears normal. There is dressing present over the left lower extremity. No cyanosis noted.  ABDOMEN: Obese, soft, nontender. No organomegaly. Positive bowel sounds.  NEUROLOGIC: Grossly intact cranial nerves II through XII. No motor deficit. Gait not tested.  SKIN: Warm and dry.  PSYCHIATRIC: The patient is awake, alert, oriented x3.   LABORATORY DATA: White count is 7.7, H and H 7.5 and 24, platelet count 120. Glucose is 132, BUN is 81, creatinine 10.3, sodium is 137, potassium is 6.3, chloride 104, bicarbonate is 20. The patient's ferritin is 88, serum iron is 42.   ASSESSMENT AND PLAN: A 74 year old patient with multiple medical problems, admitted by Dr. Delana Meyer for severe peripheral arterial disease. Internal medicine was consulted for:  1. Postoperative medical management. The patient is medically stable at this time. Will resume all his home meds. This is requested by Dr. Delana Meyer. The patient will ultimately resume on his aspirin.  2. Status post severe peripheral arterial disease with left lower extremity profunda femoris, superficial femoral and common femoral artery endarterectomy along with CorMatrix graft placement by Dr. Delana Meyer 06/30/2012.  3. End-stage renal disease with hyperkalemia. The patient currently getting dialysis. Will  follow up closely potassium, Dr. Candiss Norse on the case.  4. Chronic anemia. The patient received 3 units of blood transfusion during and after surgery. His hemoglobin is still down to 7.5. Will give 1 more unit. This was discussed with Dr. Delana Meyer, who is agreeable. The patient will be resumed back on his Procrit and IV ferriheme shots, which he gets via Dr. Inez Pilgrim at dialysis.  5. Hypothyroidism. Will resume levothyroxine.  6. Gastroesophageal reflux disease. Continue omeprazole.  7. Peripheral neuropathy. The patient is on gabapentin and Requip, which has been resumed.  8. Chronic back pain with spinal stenosis. The patient is wheelchair-bound at home.  9. Cardiomyopathy with history of chronic atrial fibrillation. The patient appears to be stable.   Further workup according to the patient's clinical course. Thank you for the consult. Will follow while the patient is in house.   TIME SPENT: 50 minutes.     ____________________________ Hart [place] Posey Pronto, MD sap:OSi D: 07/01/2012 09:45:36 ET T: 07/01/2012 11:11:05 ET JOB#: [id number]  cc: Laurelin Elson A. Posey Pronto, MD, <Dictator> Katha Cabal, MD Heinz Knuckles. Blocker, MD Simonne Come. Inez Pilgrim, MD Trinda Pascal, MD  Ilda Basset MD ELECTRONICALLY SIGNED 07/09/2012 7:05

## 2014-09-22 NOTE — Consult Note (Signed)
Impression:    74yo male w/ h/o ESRD on HD, DM, PVD, s/p recent left femoral endarterectomy admitted with dehiscense of the groin wound with fever and hypotension.     The wound has some gray drainage.  There is significant ulceration down close to the graft material.  Per conversation with the vascular surgeons, the graft material is derived from sheep tissue and is a xenograft, rather than a synthetic graft.  This would mean that removal in the face of infection would not be necessary (although it would be likely impossible to do for technical reasons regardless).   He is to go for I&D today.  Will hopefully get intraoperative cultures.     Will continue current zosyn and vanco until cultures return.   He will likely have a wound VAC in place post procedure.  Electronic Signatures: Pegi Milazzo MPH, Heinz Knuckles (MD)  (Signed on 19-Feb-14 15:27)  Authored  Last Updated: 19-Feb-14 15:27 by Jyl Chico MPH, Heinz Knuckles (MD)

## 2014-09-22 NOTE — Op Note (Signed)
PATIENT NAME:  Robert [name], Robert [name] MR#:  [id number] DATE OF BIRTH:  [birth year]  DATE OF PROCEDURE:  04/11/2013  PREOPERATIVE DIAGNOSIS: Cataract, left eye.   POSTOPERATIVE DIAGNOSIS: Cataract, left eye.  PROCEDURE PERFORMED: Extracapsular cataract extraction using phacoemulsification with placement Alcon [id number]  23.5-diopter posterior chamber lens, serial number [id number].   SURGEON: Loura Back. Elber Galyean, M.D.   ANESTHESIA: 4% lidocaine and 0.75% Marcaine, a 50-50 mixture with 10 units/mL of Hylenex added, given as a peribulbar.   ANESTHESIOLOGIST: Dr. Myra Gianotti.   COMPLICATIONS: None.   ESTIMATED BLOOD LOSS: Less than 1 mL.   DESCRIPTION OF PROCEDURE:  The patient was brought to the operating room and given a peribulbar block.  The patient was then prepped and draped in the usual fashion.  The vertical rectus muscles were imbricated using 5-0 silk sutures.  These sutures were then clamped to the sterile drapes as bridle sutures.  A limbal peritomy was performed extending two clock hours and hemostasis was obtained with cautery.  A partial thickness scleral groove was made at the surgical limbus and dissected anteriorly in a lamellar dissection using an Alcon crescent knife.  The anterior chamber was entered supero-temporally with a Superblade and through the lamellar dissection with a 2.6 mm keratome.  DisCoVisc was used to replace the aqueous and a continuous tear capsulorrhexis was carried out.  Hydrodissection and hydrodelineation were carried out with balanced salt and a 27 gauge canula.  The nucleus was rotated to confirm the effectiveness of the hydrodissection.  Phacoemulsification was carried out using a divide-and-conquer technique.  Total ultrasound time was 1 minute and 7.5 seconds with an average power of 23.3%.  CDE of 31.01.  Irrigation/aspiration was used to remove the residual cortex.  DisCoVisc was used to inflate the capsule and the internal incision was enlarged to 3 mm  with the crescent knife.  The intraocular lens was folded and inserted into the capsular bag using the Ocusert delivery system.  Irrigation/aspiration was used to remove the residual DisCoVisc.  Miostat was injected into the anterior chamber through the paracentesis track to inflate the anterior chamber and induce miosis.  0.1 mL of cefuroxime was injected via the paracentesis tract containing 1 mg of drug.  The wound was checked for leaks and none were found. The conjunctiva was closed with cautery and the bridle sutures were removed.  Two drops of 0.3% Vigamox were placed on the eye.   An eye shield was placed on the eye.  The patient was discharged to the recovery room in good condition.   ____________________________ Loura Back Juanisha Bautch, MD sad:dmm D: 04/11/2013 13:09:47 ET T: 04/11/2013 13:33:04 ET JOB#: [id number]  cc: Remo Lipps A. Strider Vallance, MD, <Dictator> Martie Lee MD ELECTRONICALLY SIGNED 04/18/2013 13:50

## 2014-09-22 NOTE — Consult Note (Signed)
PATIENT NAME:  Robert [name], Robert [name] MR#:  [id number] DATE OF BIRTH:  April 16, 1941  DATE OF CONSULTATION:  07/21/2012  REFERRING PHYSICIAN:   CONSULTING PHYSICIAN:  Heinz Knuckles. Raistlin Gum, MD  REFERRING PHYSICIAN: Dr. Manuella Ghazi   REASON FOR CONSULTATION: Possible sepsis.   HISTORY OF PRESENT ILLNESS: The patient is a 74 year old man with a past history significant for diabetes, end-stage renal disease on hemodialysis, peripheral vascular disease, status post recent left femoral endarterectomy, who was admitted on February 18, with confusion and fever. The patient had undergone his endarterectomy on January 29. Within a few days, he began having some drainage from the wound, mainly clear drainage. The drainage continued and he began having an ulceration over the area. He also began having increasing pain. He states he has had some fevers, chills and sweats at home as well. He presented to the ER on February 18, with confusion. He denies any problems at dialysis and had not missed any treatments. On admission, he was started on vancomycin and Zosyn. He is to go for I and D later this afternoon.   ALLERGIES: CIPRO, CELEBREX, MORPHINE, MEVACOR AND NEOMYCIN AND IODINATED CONTRAST DYE.   PAST MEDICAL HISTORY: 1.  Renal mass.  2.  Bladder cancer.  3.  Graves disease.  4.  End-stage renal disease on hemodialysis.  5.  Spinal stenosis.  6.  Restless leg syndrome.  7.  Hypothyroidism, postablative.  8.  Peripheral vascular disease, status post recent left lower extremity endarterectomy.  9.  Neuropathy.  10.  Benign prostatic hypertrophy.  11.  Osteoarthritis.  12.  Hypertension.  13.  Diabetes.  14.  Depression.  15.  Coronary artery disease.   SOCIAL HISTORY: The patient lives with his wife. He is a prior smoker having quit in 2003. He does not drink alcohol.   FAMILY HISTORY: Positive for diabetes in his sister, brain cancer in his mother and coronary artery disease in his father and other family  members.   REVIEW OF SYSTEMS: GENERAL: Positive for fevers, chills, sweats, malaise and lethargy.  HEENT: No headaches. No sinus congestion. No sore throat.  NECK: No stiffness. No swollen glands. RESPIRATIONS:  No cough, shortness of breath or wheezing.  CARDIAC: No chest pains, palpitations or peripheral edema.  GASTROINTESTINAL: No nausea, no vomiting, no abdominal pain, no change in his bowels.  GENITOURINARY: He does not make much urine and is on hemodialysis.  MUSCULOSKELETAL: He has had no significant joint swelling or inflammation.  SKIN: He has had the ulceration and pain in the left groin at his prior surgical site. No other rashes.  NEUROLOGIC: He has been confused and lethargic, but no focal weakness.  PSYCHIATRIC: No complaints.  All other systems are negative.   PHYSICAL EXAMINATION: VITAL SIGNS: T-max 99.6, T-current of 99.1, pulse of 90, blood pressure of 92/37 and 75% on room air, although previously he was 92% on room air.  GENERAL: A 74 year old obese white man, somewhat lethargic but in no acute distress, seen on dialysis.  HEENT: Normocephalic, atraumatic. Pupils equal, reactive to light. Extraocular motion intact. Sclerae, conjunctivae and lids are without evidence for emboli or petechiae. Oropharynx shows no erythema or exudate. Gums are in fair condition.  NECK: Supple. Full range of motion. Midline trachea. No lymphadenopathy. No thyromegaly.  LUNGS: Clear to auscultation bilaterally with good air movement. No focal consolidation.  HEART: Regular rate and rhythm without murmur, rub or gallop.  ABDOMEN: Obese, soft, nontender and nondistended. No hepatosplenomegaly. No hernia is noted.  EXTREMITIES:  No evidence for tenosynovitis.   SKIN: In the left groin, there is an opening approximately 3 to 4 cm in diameter extending to approximately 3 to 4 cm in depth. There was some dishwater gray drainage from the area, but no obvious purulence. There was some minimal  surrounding erythema. The area was moderately tender to touch. There was no obvious purulence within the wound itself. No other rashes were appreciated. There were no stigmata of endocarditis, specifically no Janeway lesions or Osler nodes.  NEUROLOGIC: The patient was lethargic, but arousable and able to answer questions appropriately. He was able to move all 4 extremities.  PSYCHIATRIC: Mood and affect appeared normal.   LABORATORY DATA: BUN 51, creatinine 8.49, bicarbonate 28, anion gap 11. LFTs from admission were unremarkable, except for an alkaline phosphatase of 203. White count is 16.2 with a hemoglobin 8.4, platelet count 157, ANC of 11.8. On admission, his white count was 17.4 with an ANC of 14.6. Blood cultures from admission show no growth to date. A wound culture that was obtained superficially is apparently pending.   A chest x-ray shows low-grade congestive heart failure, but no focal infiltrate.    Ultrasound of the left lower extremity showed no evidence of deep vein thrombosis. There is a large complex area in the left groin measuring 5.6 x 3.6 x 8.6 with no internal Doppler flow, concerning for a hematoma versus a thrombosed pseudoaneurysm.   IMPRESSION: This is a 74 year old male with a history of end-stage renal disease on hemodialysis, diabetes and peripheral vascular disease, status post left femoral endarterectomy was admitted with dehiscence of the groin wound with fever and hypotension.   RECOMMENDATIONS: 1.  The wound has some gray drainage. There is significant ulceration down close to the graft material. Per conversation with the vascular surgeon, the graft material is derived from sheep tissue and is a Xenograft rather than a synthetic graft. This would mean that removal in the face of infection would not be necessary (although it would be likely impossible to do for technical reasons regardless).  2.  He is to go for incision and drainage today. We will hopefully get  intraoperative cultures.  3.  We will continue Zosyn and vancomycin until the cultures return.  4.  He will likely have a wound VAC in place post procedure.   This is a high-level infectious disease consult. Thank you very much for involving me in the patient's care.     ____________________________ Heinz Knuckles. Antonia Jicha, MD meb:cc D: 07/21/2012 15:45:30 ET T: 07/21/2012 17:45:34 ET JOB#: [id number]  cc: Heinz Knuckles. Keziyah Kneale, MD, <Dictator> Melicia Esqueda E Mariona Scholes MD ELECTRONICALLY SIGNED 07/28/2012 8:29

## 2014-09-22 NOTE — Op Note (Signed)
PATIENT NAME:  Robert [name], Robert [name] MR#:  [id number] DATE OF BIRTH:  1940-08-03  DATE OF PROCEDURE:  09/29/2012  PREOPERATIVE DIAGNOSES:  1. Peripheral arterial disease with gangrene, left lower extremity.  2. Diabetes mellitus.  3. End-stage renal disease.  4. Status post left common femoral endarterectomy.    POSTOPERATIVE DIAGNOSES:  1. Peripheral arterial disease with gangrene, left lower extremity.  2. Diabetes mellitus.  3. End-stage renal disease.  4. Status post left common femoral endarterectomy.   PROCEDURE: 1. Ultrasound guidance for vascular access, right femoral artery.  2. Catheter placement to left popliteal artery from right femoral approach.  3. Aortogram and selective left lower extremity angiogram.  4. Percutaneous transluminal angioplasty of long left SFA occlusion with 6 mm diameter angioplasty balloon.  5. StarClose closure device, right femoral artery.   SURGEON: Algernon Huxley, MD   ANESTHESIA: Local with moderate conscious sedation.   ESTIMATED BLOOD LOSS: 25 mL.  CONTRAST USED: 75 mL Visipaque.   FLUOROSCOPY TIME: Approximately 8 minutes.   INDICATION FOR PROCEDURE: This is a 74 year old white male with end-stage renal disease, multiple other issues, he has severe peripheral vascular disease. He has developed gangrenous changes to his left second toe. He was seen in the office yesterday and prepared for angiogram today.   DESCRIPTION OF THE PROCEDURE: The patient was brought to the vascular interventional radiology suite. Groins were shaved and prepped and a sterile surgical field was created. The right femoral artery was visualized with ultrasound and found to be patent, but diseased. It was accessed under direct ultrasound guidance with a Seldinger needle and a permanent image was recorded. A 5-French sheath was then placed. Pigtail catheter was placed in the aorta at the L1-L2 level and AP aortogram was performed. This showed some ectasia of the aorta with  severe aortoiliac calcification. There was some mild stenosis of the right common iliac artery that appeared less than 50%. The left common and external iliac arteries had no flow-limiting stenosis. I then hooked the aortic bifurcation and advanced to the left femoral head and selective left lower extremity angiogram was then performed. This showed the common femoral endarterectomy was patent but just beyond what appeared to be the distal endpoint onto the SFA, there was an occlusion. The profunda femoris artery appeared patent. He then reconstituted in the distal SFA at the bottom of a previously placed SFA stent. The patient was systemically heparinized. A 6-French Ansell sheath was placed over a Terumo advantage wire. I was able to cross the occlusion with minimal difficulty with a Terumo advantage wire and the CXI catheter, confirming intraluminal flow in the popliteal artery. I then replaced the wire. I had to pre-dilate the lesion with a 3 mm diameter angioplasty balloon in a calcific area just above the previously placed stent. After pre-dilatation, we used a 6 mm diameter angioplasty balloon which inflated to profile and completion angiogram following this showed this area to now be patent without any residual stenosis of greater than 30 to 40% and maintained flow distally, which the best runoff was the posterior tibial, but he actually had a normal tibial trifurcation. At this point, we had improved his vascular status and I elected to terminate the procedure. The sheath was pulled back to the ipsilateral external cautery and oblique arteriogram was performed. StarClose closure device was deployed in the usual fashion with excellent hemostatic result. The patient tolerated the procedure well and was taken to the recovery room in stable condition.    ____________________________  Algernon Huxley, MD jsd:es D: 09/29/2012 14:15:46 ET T: 09/29/2012 14:36:34 ET JOB#: [id number]  cc: Algernon Huxley, MD,  <Dictator> Algernon Huxley MD ELECTRONICALLY SIGNED 10/07/2012 15:51

## 2014-09-22 NOTE — Op Note (Signed)
PATIENT NAME:  Robert [name], DELO MR#:  [id number] DATE OF BIRTH:  [birth year]  DATE OF PROCEDURE:  12/23/2012  PREOPERATIVE DIAGNOSIS: Osteomyelitis, left second metatarsal.   POSTOPERATIVE DIAGNOSIS: Osteomyelitis, left second metatarsal.   PROCEDURE: Excisional debridement infected bone, left foot.   SURGEON: Durward Fortes, DPM   ANESTHESIA: Local/MAC.   HEMOSTASIS: Pneumatic tourniquet, left ankle, 250 mmHg.   ESTIMATED BLOOD LOSS: 20 mL to 30 mL.   IMPLANTS: Rapid Cure vancomycin-impregnated beads.   DRAINS: None.   PATHOLOGY: Left second metatarsal head.   COMPLICATIONS: None apparent.   OPERATIVE INDICATIONS: This is a 74 year old male, with a history of diabetes, peripheral vascular disease and on dialysis, who recently underwent amputation of the left second toe following revascularization. The toe went on to not heal the incision and the second metatarsal head became infected, confirmed by x-rays. Decision was made for excisional debridement of the infected bone.   OPERATIVE PROCEDURE: The patient was taken to the operating room and placed on the table in the supine position. Following satisfactory sedation, the left foot was anesthetized with 10 mL of 0.5% Sensorcaine plain around the second metatarsal shaft. A pneumatic tourniquet was applied at the level of the left ankle and the foot was prepped and draped in the usual sterile fashion. The foot was exsanguinated and the tourniquet inflated to 250 mmHg.   Attention was then directed to the dorsal aspect of the left foot where an approximate 4 cm linear incision was made, coursing proximal to distal, centered over the metatarsal head and going through the open ulceration. The incision was carried sharply down to bone. Periosteal tissue was reflected off the bone using a key elevator. Using a pneumatic saw, the metatarsal was incised at its distal one-third and then removed in toto. The remaining capsular and devitalized  tissue were then removed excisionally using a rongeur and then excisional debridement was performed using a Versajet debrider on a setting of 7. There was noted to be good, healthy tissues at this point.   Next, Rapid Cure vancomycin-impregnated beads were then implanted into the wound and several into the second metatarsal shaft itself. The wound was then closed using 4-0 Vicryl simple interrupted, as well as vertical mattress sutures. Xeroform and a sterile gauze bandage along with 4 x 4's, fluffs, ABD, and Kerlix were applied. Tourniquet was released. An Ace bandage was applied for compression. The patient tolerated the procedure and anesthesia well and was transported to the PACU with vital signs stable and in good condition.    ____________________________ Sharlotte Alamo, DPM tc:np D: 12/23/2012 14:23:00 ET T: 12/23/2012 16:24:16 ET JOB#: [id number]  cc: Sharlotte Alamo, DPM, <Dictator>   Ilithyia Titzer DPM ELECTRONICALLY SIGNED 12/30/2012 10:24

## 2014-09-22 NOTE — Consult Note (Signed)
PCP Dr [facility] Dr Oley Balm gittin Post op medical mnxon HDgetting HDsevere PAD with left LE multivessel endarterectomy by dr schnier 06/2928/14anemia3 units BBT. to get 1 more unit todaydr gittin on procirt and epogenback pain with spinal stenosiswc bound at [facility] h/o chronic afib ptdr schnier you for the consult   Electronic Signatures: Ilda Basset (MD) (Signed on 30-Jan-14 09:37)  Authored   Last Updated: 30-Jan-14 09:44 by Ilda Basset (MD)

## 2014-09-22 NOTE — Discharge Summary (Signed)
PATIENT NAME:  Robert [name], Robert [name] MR#:  [id number] DATE OF BIRTH:  08/20/1940  DATE OF ADMISSION:  06/30/2012 DATE OF DISCHARGE:  07/02/2012  DISCHARGE DIAGNOSIS:  Atherosclerotic occlusive disease, bilateral lower extremities with rest pain and ischemic fissures of the left lower extremity.   SECONDARY DIAGNOSES:  Hypertension, diabetes, end-stage renal disease.   CONSULTATIONS:  Dr. Candiss Norse, nephrology and Dr. Fritzi Mandes, [facility], medical management.   PROCEDURES:  On 06/30/2012 the patient underwent a left femoral endarterectomy with xenograft patch angioplasty.   HISTORY OF PRESENT ILLNESS:  The patient is a 74 year old gentleman who presented to the office with worsening pain as well as ischemic changes of his left foot.  He is a poor surgical candidate and failed attempts at intervention.  He is therefore undergoing common femoral artery treatment, endarterectomy with patch to improve distal perfusion.   HOSPITAL COURSE:  On the day of admission, the patient underwent successful surgery.  Postoperatively he had an uncomplicated course.  He did undergo dialysis on postoperative day #1.  On postoperative day #2, his pain was well-controlled.  Physical therapy has worked with him and deemed him fit for discharge to home with a walker.   The patient is therefore discharged to home.    DIET: He is to continue his ADA renal diet.    MEDICATIONS:  His medications are as ordered.  He will take Vicodin for pain.    ACTIVITIES:  Are light activities.   FOLLOW-UP:  He will follow up with me in the office in the next two weeks.      ____________________________ Katha Cabal, MD ggs:ea D: 07/02/2012 18:31:39 ET T: 07/04/2012 02:44:40 ET JOB#: [id number]  cc: Katha Cabal, MD, <Dictator> Katha Cabal, MD Katha Cabal MD ELECTRONICALLY SIGNED 07/19/2012 23:25

## 2014-09-22 NOTE — Consult Note (Signed)
CHIEF COMPLAINT and HISTORY:  Subjective/Chief Complaint left groin wound   History of Present Illness Had left femoral endarterectomy 3 weeks ago for PAD with ulceration.  Admitted with possible sepsis. left groin wound is open with some necrotic tissue, mild cellulitis, and a significant opening that will likely need debridement to get coverage over the artery.   PAST MEDICAL/SURGICAL HISTORY:  Past Medical History:   Swallowing difficulty:    Atrial Fibrillation:    Cardiomyopathy:    Back Pain, Chronic:    Dialysis:    02 _0  HS:    COPD:    Hard of Hearing:    Diabetes:    Parkinson's Disease:    Renal Insufficiency:    Enlarged Prostate:    Cancer, Bladder:    Anemia:    Hypercholesterolemia:    Hypertension:    AV graft -- L arm:    Cervical spine repair:    Carotid Endarterectomy:    Thyroid eradication d/t Graves Disease:    nasal surgery:    stent in left leg:    bladder surgery to remove cancer:    Pacemaker:    Cholecystectomy:    CABG (Coronary Artery Bypass Graft):   ALLERGIES:  Allergies:  Morphine: Itching, Hives, Other  Cipro: Itching  Contrast - Iodinated Radiocontrast Dye: Other  Celebrex: Itching  Mevacor: Other  Niacin: Unknown  HOME MEDICATIONS:  Home Medications:  Medication Instructions Status  omeprazole 40 mg oral delayed release capsule 1 tab(s) orally once a day am Active  gabapentin 100 mg oral capsule 1 cap(s) orally 3 times a day Active  midodrine 5 mg oral tablet 1 tab(s) orally every 4 hour while awake, As Needed- for Nausea, Vomiting  Active  Rena-Vite oral tablet 1 tab(s) orally once a day, in am Active  ProAir HFA 90 mcg/inh inhalation aerosol 2 puff(s) inhaled 4 times a day, As Needed Active  Procrit 3000 units/mL injectable solution 2 milliliter(s) injectable twice a week at dialysis Active  calcium acetate 667 mg oral tablet 3 tab(s) orally 3 times a day, 2 with breakfast, 3 with lunch and dinner and  1 with snacks Active  Requip 5 mg oral tablet 1 tab(s) orally 3 times a day, morning noon and night Active  B Complex 50 1 tab(s) orally once a day Active  Co Q-10 100 mg oral capsule 2 cap(s) orally once a day Active  Vitamin D3 2000 intl units oral tablet 1 tab(s) orally once a day Active  finasteride 5 mg oral tablet 1 tab(s) orally once a day Active  Levoxyl 150 mcg (0.15 mg) oral tablet 1 tab(s) orally once a day in am Active  probiotic 4 1 tab orally daily in am Active  carisoprodol 350 mg oral tablet tab(s) orally once a day (at bedtime) Active  Nitrostat 0.4 mg sublingual tablet 1 tab(s) sublingual every 5 minutes Active  aspirin 29m 1 tab(s)  2 times a day  Active   Family and Social History:  Family History Coronary Artery Disease  Cancer    Social History negative tobacco, negative ETOH, quit smoking 10 years ago   Place of Living Home    Review of Systems:  Subjective/Chief Complaint sepsis   Fever/Chills Yes    Cough Yes    Sputum No    Abdominal Pain No    Diarrhea No    Constipation No    Nausea/Vomiting No    SOB/DOE Yes    Chest Pain No    Telemetry Reviewed  Afib   Dysuria No    Tolerating PT No    Tolerating Diet Yes    Medications/Allergies Reviewed Medications/Allergies reviewed    Physical Exam:  GEN obese, chronically ill appearing   HEENT hearing intact to voice, moist oral mucosa   NECK No masses  trachea midline    RESP postive use of accessory muscles  wheezing    CARD irregular rate  no JVD    VASCULAR ACCESS AV graft present    ABD denies tenderness  normal BS    GU esrd patient    LYMPH negative neck, negative axillae   EXTR positive edema   SKIN wound left groin with necrotic tissue present, mild cellulitis   NEURO cranial nerves intact, motor/sensory function intact   PSYCH A+O to time, place, person   LABS:  Laboratory Results:  Hepatic:    18-Feb-14 18:39, Comprehensive Metabolic Panel  Bilirubin,  Total 0.5  Alkaline Phosphatase 203  SGPT (ALT) 12  SGOT (AST) 19  Total Protein, Serum 7.3  Albumin, Serum 2.9  Routine Micro:    18-Feb-14 18:39, Blood Culture  Micro Text Report   BLOOD CULTURE    COMMENT                   NO GROWTH IN 8-12 HOURS     ANTIBIOTIC  Culture Comment   NO GROWTH IN 8-12 HOURS   Result(s) reported on 21 Jul 2012 at 08:00AM.    18-Feb-14 19:50, Blood Culture  Micro Text Report   BLOOD CULTURE    COMMENT                   NO GROWTH IN 8-12 HOURS     ANTIBIOTIC  Culture Comment   NO GROWTH IN 8-12 HOURS   Result(s) reported on 21 Jul 2012 at 08:00AM.  Cardiology:    18-Feb-14 18:42, ECG  Ventricular Rate 123  Atrial Rate 119  QRS Duration 164  QT 306  QTc 438  R Axis 110  T Axis -48  ECG interpretation   Atrial fibrillation with rapid ventricular response  Right bundle branch block  T wave abnormality, consider inferior ischemia or digitalis effect  Abnormal ECG  When compared with ECG of 03-Feb-2012 11:30,  Previous ECG has undetermined rhythm, needs review  T wave inversion no longer evident in Lateral leads  ----------unconfirmed----------  Confirmed by OVERREAD, NOT ([id number]), editor PEARSON, BARBARA ([id number]) on 07/21/2012 8:41:30 AM  ECG   Routine Chem:    18-Feb-14 18:39, B-Type Natriuretic Peptide ([facility])  B-Type Natriuretic Peptide [facility]) [id number]  Result(s) reported on 20 Jul 2012 at 07:27PM.    18-Feb-14 18:39, Comprehensive Metabolic Panel  Glucose, Serum 184  BUN 44  Creatinine (comp) 7.47  Sodium, Serum 134  Potassium, Serum 4.7  Chloride, Serum 97  CO2, Serum 26  Calcium (Total), Serum 8.9  Osmolality (calc) 284  eGFR (African American) 8  eGFR (Non-African American) 6  eGFR values <46m/min/1.73 m2 may be an indication of chronic  kidney disease (CKD).  Calculated eGFR is useful in patients with stable renal function.  The eGFR calculation will not be reliable in acutely ill patients  when serum creatinine is changing  rapidly. It is not useful in   patients on dialysis. The eGFR calculation may not be applicable  to patients at the low and high extremes of body sizes, pregnant  women, and vegetarians.  Anion Gap 11    19-Feb-14 028:78 Basic Metabolic Panel (w/Total Calcium)  Glucose, Serum 143  BUN 51  Creatinine (comp) 8.49  Sodium, Serum 134  Potassium, Serum 4.5  Chloride, Serum 95  CO2, Serum 28  Calcium (Total), Serum 9.1  Anion Gap 11  Osmolality (calc) 284  eGFR (African American) 6  eGFR (Non-African American) 6  eGFR values <36m/min/1.73 m2 may be an indication of chronic  kidney disease (CKD).  Calculated eGFR is useful in patients with stable renal function.  The eGFR calculation will not be reliable in acutely ill patients  when serum creatinine is changing rapidly. It is not useful in   patients on dialysis. The eGFR calculation may not be applicable  to patients at the low and high extremes of body sizes, pregnant  women, and vegetarians.    19-Feb-14 02:56, Magnesium, Serum  Magnesium, Serum 1.7  1.8-2.4  THERAPEUTIC RANGE: 4-7 mg/dL  TOXIC: > 10 mg/dL   -----------------------  Cardiac:    18-Feb-14 18:39, Troponin I  Troponin I 0.03  0.00-0.05  0.05 ng/mL or less: NEGATIVE   Repeat testing in 3-6 hrs   if clinically indicated.  >0.05 ng/mL: POTENTIAL   MYOCARDIAL INJURY. Repeat   testing in 3-6 hrs if   clinically indicated.  NOTE: An increase or decrease   of 30% or more on serial   testing suggests a   clinically important change    19-Feb-14 02:56, Cardiac Panel  CK, Total 293  CPK-MB, Serum 1.3  Result(s) reported on 21 Jul 2012 at 05:19AM.    19-Feb-14 02:56, Troponin I  Troponin I 0.04  0.00-0.05  0.05 ng/mL or less: NEGATIVE   Repeat testing in 3-6 hrs   if clinically indicated.  >0.05 ng/mL: POTENTIAL   MYOCARDIAL INJURY. Repeat   testing in 3-6 hrs if   clinically indicated.  NOTE: An increase or decrease   of 30% or more on serial   testing  suggests a   clinically important change  Routine Hem:    18-Feb-14 18:39, CBC Profile  WBC (CBC) 17.4  RBC (CBC) 3.60  Hemoglobin (CBC) 9.5  Hematocrit (CBC) 30.8  Platelet Count (CBC) 171  MCV 85  MCH 26.5  MCHC 31.0  RDW 17.8  Neutrophil % 83.5  Lymphocyte % 7.1  Monocyte % 8.8  Eosinophil % 0.0  Basophil % 0.6  Neutrophil # 14.6  Lymphocyte # 1.2  Monocyte # 1.5  Eosinophil # 0.0  Basophil # 0.1  Result(s) reported on 20 Jul 2012 at 07:14PM.    19-Feb-14 02:56, CBC Profile  WBC (CBC) 16.2  RBC (CBC) 3.28  Hemoglobin (CBC) 8.4  Hematocrit (CBC) 27.7  Platelet Count (CBC) 157  MCV 85  MCH 25.6  MCHC 30.2  RDW 18.0  Neutrophil % 73.1  Lymphocyte % 13.5  Monocyte % 12.1  Eosinophil % 0.2  Basophil % 1.1  Neutrophil # 11.8  Lymphocyte # 2.2  Monocyte # 2.0  Eosinophil # 0.0  Basophil # 0.2  Result(s) reported on 21 Jul 2012 at 03:15AM.   RADIOLOGY:  Radiology Results:  XRay:    03-Sep-13 12:10, Chest PA and Lateral  Chest PA and Lateral  REASON FOR EXAM:    htn  COMMENTS:       PROCEDURE: DXR - DXR CHEST PA (OR AP) AND LATERAL  - Feb 03 2012 12:10PM     RESULT:     Comparison is made to a prior study dated 09/27/2010.    There is prominence of the interstitial markings and peribronchial   cuffing.  These findings are mild to moderate. No focal regions of   consolidation are identified. The cardiac silhouette is enlarged   indicative of cardiomegaly. A left sided pectoralis pacing unit is   identified with lead tips projecting in the region of the right atrium   and right ventricle. The visualized bony skeleton is unremarkable.  IMPRESSION:  Interstitial infiltrate likely representing a component of   pulmonary edema. No focal regions of consolidation or focal infiltrates   appreciated.      Thank you for this opportunity to contribute to the care of your patient.         Verified By: Mikki Santee, M.D., MD    18-Feb-14 19:10, Chest Portable  Single View  Chest Portable Single View  REASON FOR EXAM:    fever, weakness  COMMENTS:       PROCEDURE: DXR - DXR PORTABLE CHEST SINGLE VIEW  - Jul 20 2012  7:10PM     RESULT: Comparison is made to the study of February 03, 2012.    The cardiac silhouette remains enlarged. The patient has undergone   previous CABG. A permanent pacemaker is in place. The pulmonary   vascularity is prominent. There is no pleural effusion. There may be   minimal atelectasis in the left lateral costophrenic gutter.    IMPRESSION:  The findings suggest low-grade CHF. There is no focal   pneumonia. A followup PA and lateral chest x-ray would be of value.   Dictation Site: 5        Verified By: DAVID A. Martinique, M.D., MD  [place]:    08-Jul-13 10:20, US Kidney Bilateral  US Kidney Bilateral  REASON FOR EXAM:    renal cell Carcinoma  COMMENTS:       PROCEDURE: KUS - KUS KIDNEYS  - Dec 08 2011 10:20AM     RESULT: The study was technically difficult. Both kidneys demonstrate   cortical thinning. Neither kidney exhibits evidence of obstruction.    The right kidney measures 11.1 x 5.4 x 4.4 cm. In the mid pole a solid   appearing mass is seen measuring 3.8 x 3.5 x 3.2 cm. In the upper pole   there is a 3.1 cm diameter cyst. In the mid pole there are 2 cysts one   measuring 3.1 cm in greatest dimension and a second measuring 1.3 cm in   greatest dimension.     The left kidney measures 11.8 x 4.6 x 6.1 cm and contains multiple cysts.     In the upper pole there is a cyst measuring 1.5 cm in diameter. An   adjacent 3 cm diameter cyst is demonstrated on the left.    IMPRESSION:   1. There is a solid appearing mass measuring 3.8 cm in greatest dimension   in the midpole of the right kidney. There are cysts elsewhere in the   right kidney. Allowing for differences in measurement technique this is   not greatly changed from a previous CT scan of August 07, 2011. The  2. The left kidney demonstrates multiple  cysts.  3. There is cortical thinning of both kidneys.          Verified By: DAVID A. Martinique, M.D., MD    16-Jan-14 14:19, US Kidney Bilateral  US Kidney Bilateral  REASON FOR EXAM:    Renal Cell Carcinoma  COMMENTS:       PROCEDURE: [place]  - US KIDNEY  - Jun 17 2012  2:19PM  RESULT: Comparison: 06/04/2011, 12/08/2011    Technique: Multiple grayscale and color Doppler images were the kidneys.    Findings:  The rightkidney measures 10.4 x 5.8 x 4.6 cm. The left kidney measures   6.9 x 4.8 x 4.0 cm. There is a solid mass in the midpole of the right   kidney which measures 3.8 x 3.5 x 3.5 cm. This is similar to prior. There   are 2 cysts in the right kidney. The cyst in the midpole measures 2.5 x   2.3 x 2.8 cm. The cyst in the superior pole measures 2.5 x 1.8 x 1.8 cm.     There are several cysts in the superior pole of the left kidney.    There is no hydronephrosis. Renal echogenicity is increased, as can be  seen with medical renal disease.    IMPRESSION:   Solid renal mass in the right kidney is similar to prior.    Dictation site: 2        Verified By: Gregor Hams, M.D., MD  LabUnknown:  PACS Image    18-Feb-14 19:10, Chest Portable Single View  PACS Image  CT:    07-Mar-13 11:34, CT Abdomen and Pelvis Without Contrast  CT Abdomen and Pelvis Without Contrast  REASON FOR EXAM:    ORAL CONTRAST ONLY  abd pain pelvic pain fever chills  COMMENTS:       PROCEDURE: KCT - KCT ABDOMEN/PELVIS WO  - Aug 07 2011 11:34AM     RESULT: CT of the abdomen and pelvis with oral contrast only is   reconstructed at 5.0 mm slice thickness in the axial plane and compared   to images dated November 04, 2010 and February 05, 2011. There is a history of   previous cryoablation of a right renal mass.    The kidneys show cortical thinning with a lobulated appearance. Tiny   exophyticnodular density is seen from the upper pole of left kidney   measuring 10.5 mm with a similar lesion  posteriorly from the mid left   kidney on image 32 and left perinephric stranding. Atherosclerotic   calcification is noted. There is evidence of a cyst in the upper third of     the right kidney with a medial mid right renal mass with calcification   measuring 3.36 cm anterior to posterior and approximately 3.67 cm medial   to lateral on image 36. There is a 2 Hounsfield unit lesion at that level  laterally consistent with a cyst measuring 2.53 x 2.50 cm. The right   renal midpole medial mass has a Hounsfield reading of up to 35 Hounsfield   units. The liver shows no ductal dilation. Cholecystectomy clips are   present. The spleen is unremarkable. The included lung bases show minimal   respiratory motion artifact and grossly normal aeration. No pleural or   pericardial effusion is seen. Pacemaker leads are present. The urinary   bladder is nondistended. The prostate is mildly enlarged. Extensive   colonic diverticular disease is seen. No abnormal bowel distention or   definite abnormal bowel wall thickening is evident. The adrenal glands   are unremarkable. No adenopathy is evident. Degenerative bony changes are   seen in the pelvis and spine.    IMPRESSION:   1. Chronic renal disease evident.  2. Mass medially in the right kidney appears stable containing some   calcification. Small lesions are stable in the left kidney. Stable cystic   areas in the right kidney.  Neither kidney shows obstruction.   3. Prominent atherosclerotic changes are present.   4. Colonic diverticulosis is seen.   5. Cholecystectomy changes are present.   6. The lung bases are clear.    Thank you for the opportunity to contribute to the care of your patient.           Verified By: Sundra Aland, M.D., MD    26-Jul-13 07:14, CT Abdomen and Pelvis Without Contrast  CT Abdomen and Pelvis Without Contrast  REASON FOR EXAM:    (1) generalized abdominal pain and nausea; (2)   generalized abdominal pain and  na  COMMENTS:       PROCEDURE: CT  - CT ABDOMEN AND PELVIS W0  - Dec 26 2011  7:14AM     RESULT: Comparison is made to a prior study dated 08/07/2011.    Technique: Helical noncontrasted 3 mm sections were obtained from the   lung bases through the pubic symphysis.    Findings: Mild interstitial and emphysematous changes are identified   within the lung bases. Multichambered cardiac enlargement is identified   as well as coarse coronary artery calcifications.    Evaluation of the liver demonstrates a reticular parenchymal appearance   with mild nodularity along the border. The spleen, adrenals and pancreas   are unremarkable.    When compared to the previous study, a prominent soft tissue appearing   masslike area is identified within the medial aspect of the right kidney   which is indeterminate and stable. This area contains calcifications.   Small high-attenuating exophytic lesion is identified in the upper pole   of the left kidney which is also indeterminate and stable. There is mild   thickening of the urinary bladder wall more prominent anteriorly. There   is no evidence of hydronephrosis, hydroureter or ureterolithiasis. There   is no CT evidence of bowel obstruction, enteritis, colitis or   diverticulitis. There is diverticulosis within the sigmoid colon. There   is no evidence of an abdominal aortic aneurysm. The urinary bladder is   partially contracted.   IMPRESSION:      1. Prominent soft tissue in the medial aspect of the right kidney which   is indeterminate and stable from prior study.   2. Small high-attenuating exophytic lesion upper pole left kidney also   indeterminate and stable. Further evaluation with ultrasound may help to   further characterize these lesions or possibly MRI.  2. Diverticulosis in the sigmoid colon.   3. Mild thickening of the urinary bladder wall. Further evaluation with   direct visualization recommended if clinically warranted.   4.  There is nonspecific prominence of the sigmoid colon without   surrounding evidence of inflammation. Clinical correlation of this   finding is recommended.   5. Dr. Owens Shark of the Emergency Department was informed of these findings   via a preliminary faxed report.  Thank you for the opportunity to contribute to the care of your patient.           Verified By: Mikki Santee, M.D., MD   ASSESSMENT AND PLAN:  Assessment/Admission Diagnosis left groin wound infection possible sepsis, stable now   Plan the wound is sizeable with significant nonviable tissue and drainage. Would benefit from OR debridement and possible VAC placement at some point to help get coverage over artery Had breakfast Will try to do this afternoon or tommorrow   Electronic Signatures: Algernon Huxley (MD)  (Signed 19-Feb-14 10:21)  Authored: Chief Complaint  and History, PAST MEDICAL/SURGICAL HISTORY, ALLERGIES, HOME MEDICATIONS, Family and Social History, Review of Systems, Physical Exam, LABS, RADIOLOGY, Assessment and Plan   Last Updated: 19-Feb-14 10:21 by Algernon Huxley (MD)

## 2014-09-22 NOTE — H&P (Signed)
PATIENT NAME:  Robert [name], Robert [name] MR#:  [id number] DATE OF BIRTH:  05-Jul-1940  DATE OF ADMISSION:  07/20/2012  PRIMARY CARE PHYSICIAN:  Dr. Legrand Como Blocker.   PRIMARY HEMATOLOGIST:  Dr. Inez Pilgrim.    PRIMARY NEPHROLOGIST:  Dr. Trinda Pascal from [facility].   PRIMARY CARDIOLOGIST:  Dr. Virl Axe.    CHIEF COMPLAINT:  Confusion and fever.   HISTORY OF PRESENT ILLNESS:  The patient is a 74 year old obese Caucasian male with past medical history significant for chronic anemia, transfusion dependent, chronic atrial fibrillation status post pacemaker, congestive heart failure and cardiomyopathy, coronary artery disease status post bypass graft surgery, COPD on 2 liters nocturnal home oxygen, end-stage renal disease on hemodialysis, hypertension, diabetes and peripheral vascular disease who was in the hospital about three weeks ago for worsening of peripheral vascular disease and had a left femoral artery endarterectomy done by Dr. Delana Meyer.  The patient was discharged with a dressing in his groin and he has been having worsening of his left leg pain with swelling of that leg and he has been having serosanguineous discharge since his procedure.  Three days ago the family noticed that he had extensive amount of bleeding that saturated the dressing so they changed the dressing.  This morning patient appeared to be extremely confused with chills, could not stay awake and had some slurred speech, so was brought to the hospital and found to have a low-grade temp here.  The patient's blood pressure is on the lower side and he is febrile with an  elevated white count and his femoral wound in the left groin area looks open with possible infection and foul-smelling discharge at this time so he is being admitted for possible sepsis.   PAST MEDICAL HISTORY: 1.  Chronic anemia, transfusion dependent and also receiving IV iron and Procrit, follows up with Dr. Inez Pilgrim as an outpatient.  2.  Chronic atrial  fibrillation status post pacemaker, not on anticoagulation due to his anemia.  3.  Cardiomyopathy and congestive heart failure, unknown ejection fraction.  4.  Chronic back pain due to lumbar spinal stenosis.  The patient is wheelchair-bound.  5.  End-stage renal disease on Monday, Wednesday, Friday hemodialysis.  6.  COPD on 2 liters nocturnal home oxygen.  7.  Type 2 diabetes mellitus.  8.  Enlarged prostrate.  9.  CAD status post bypass graft surgery.  10.  History of bladder cancer.  11.  Parkinson's disease.  12.  Hypertension.  13.  Hyperlipidemia.   PAST SURGICAL HISTORY: 1.  Left arm AV graft.  2.  C-spine repair surgery.   3.  Left carotid endarterectomy.   4.  Thyroid radiation due to Graves' disease.  5.  Nasal surgery.  6.  Stent placed in the left leg and multiple angioplasties.  7.  Recent left femoral endarterectomy and xenograft procedure done.  8.  Bladder surgery secondary to cancer.  9.  Pacemaker placement.  10.  Coronary artery bypass graft surgery.  11.  Cholecystectomy.   ALLERGIES TO MEDICATIONS:  CIPRO, CELEBREX, MORPHINE, MEVACOR, NEOCIN.    HOME MEDICATIONS: 1.  Aspirin 81 mg by mouth twice a day.  2.  B complex 1 tablet by mouth daily.  3.  PhosLo 667 mg 3 tablets 3 times a day.  4.  Soma 350 mg by mouth at bedtime.  5.  Coenzyme Q 100 mg 2 tablets once a day.  6.  Finasteride 5 mg by mouth daily.  7.  Gabapentin 100 mg by mouth  3 times daily.  8.  Levoxyl 150 mcg by mouth daily.  9.  Midodrine 5 mg every 4 hours while awake.  10.  Nitrostat 0.4 mg sublingual tablet every 5 minutes.  11.  Omeprazole 40 mg by mouth daily.  12.  ProAir inhaler 2 puffs 4 times a day.  13.  Probiotic 1 capsule by mouth daily.  14.  Procrit 2 mL injectable twice a week at dialysis.  15.  Rena-Vite oral tablet once a day.  16.  ReQuip 5 mg by mouth 3 times a day.  17.  Vitamin D3 2000 international units by mouth daily.   SOCIAL HISTORY:  Lives at home with his  wife.  He has a motorized scooter to ambulate around.  Quit smoking more than 10 years ago.  No alcohol use.   FAMILY HISTORY:  Significant for heart disease in the father and mom died from brain cancer.   REVIEW OF SYSTEMS:  CONSTITUTIONAL:  Positive for fever, fatigue and weakness.  EYES:  Positive for blurred vision and cataracts.  No inflammation or glaucoma.  EARS, NOSE, THROAT:  Positive for decreased hearing.  No tinnitus, ear pain, epistaxis or discharge.  RESPIRATORY:  History of COPD.  No cough, wheeze or hemoptysis.  CARDIOVASCULAR:  No chest pain, orthopnea, edema, arrhythmia, palpitations or syncope.  GASTROINTESTINAL:  No nausea, vomiting, diarrhea, abdominal pain, hematemesis or melena.  GENITOURINARY:  The patient is a new dialysis patient.  ENDOCRINE:  No polyuria, nocturia, thyroid problems, heat or cold intolerance.  HEMATOLOGY:  No anemia, easy bruising or bleeding.  SKIN:  No acne, rash or lesions.  MUSCULOSKELETAL:  No neck, back, shoulder pain, arthritis or gout.  NEUROLOGIC:  No history of CVA or TIA.  Positive for both feet numbness from his peripheral vascular disease.   PSYCHOLOGIC:  No anxiety, insomnia, or depression.   PHYSICAL EXAMINATION: VITAL SIGNS:  Temperature 101.4 degrees Fahrenheit, pulse 129, respirations 22, blood pressure 108/51, pulse ox 93% on room air.  GENERAL:  Heavily built, well-nourished male lying in bed, not in any acute distress.  HEENT:  Normocephalic, atraumatic.  Pupils equal, round, and reacting to light.  Anicteric sclerae.  Extraocular movements intact.  Oropharynx clear without erythema, mass or exudates.  NECK:  Supple.  No thyromegaly, JVD or carotid bruits.  No lymphadenopathy.  LUNGS:  He is moving air bilaterally, bibasilar crackles.  No use of accessory muscles for breathing.  CARDIOVASCULAR:  S1, S2, regular rate and rhythm, 3/6 systolic murmur.  No rubs or gallops.  ABDOMEN:  Obese, soft, nontender, nondistended.  No  hepatosplenomegaly.  Normal bowel sounds.  EXTREMITIES:  He does have pedal edema of the right lower extremity 1+, but 2 to 3+ edema of the left lower extremity.  Feeble pulses and the left groin wound is irregular shaped, about 3 x 5 cm with mottled margins and whitish exudate with foul-smelling discharge present.  SKIN:  No acne, rash or lesions other than the one described above.  NEUROLOGIC:  Cranial nerves intact.  No focal motor or sensory deficits.  PSYCHOLOGICAL:  The patient is sleepy but easily arousable and oriented x 3.   LABORATORY AND RADIOLOGIC STUDIES:  WBC 17.4, hemoglobin 9.4, hematocrit 30.8, platelet count 171.  Sodium 134, potassium 4.7, chloride 97, bicarb 26, BUN 44, creatinine 7.47, glucose of 184 and calcium of 8.9.  ALT 12, AST 19, alk phos 203, total bili 0.5, albumin of 3.9 and troponin 0.03.  BNP is elevated at 33,403.  Chest x-ray suggesting findings of low-grade CHF.  No focal pneumonia is present.  EKG, atrial fibrillation with RVR, heart rate of 123 and right bundle branch block.   ASSESSMENT AND PLAN:  A 74 year old male with history of coronary artery disease status post bypass graft surgery, congestive heart failure, hypertension, atrial fibrillation, transfusion- dependent anemia, end-stage renal disease on hemodialysis, chronic obstructive pulmonary disease on 2 liters home oxygen, diabetes and Parkinson's disease, who was recently in the hospital three weeks ago for left femoral endarterectomy for severe peripheral vascular disease comes back with sepsis.  1.  Sepsis with fever, leukocytosis, tachycardia and hypertension, likely source appears to be the left groin wound.  At this time we will get blood and wound cultures.  We will start on empiric vancomycin and Zosyn and we will get vascular consult and ID consult.  2.  Atrial fibrillation with rapid ventricular response with known history of atrial fibrillation.  Rapid ventricular response is due to underlying  infection.  If blood pressure drops we will give a gentle fluid bolus.  Until then, monitor the patient as he is a dialysis patient.  We will give Metoprolol IV push as needed, not on any anticoagulation at home due to anemia as he is transfusion dependent.  3.  End-stage renal disease on Monday, Wednesday, Friday hemodialysis.  He is due for dialysis tomorrow with [facility], so we will get a Nephrology consult and continue home medications.  4.  Coronary artery disease status post bypass graft surgery, appears stable.  We will hold aspirin if he needs any procedure.  5.  Altered mental status on admission, likely metabolic encephalopathy due to infection.  6.  Chronic anemia, transfusion dependent, follows with [facility].  Hemoglobin is stable.  He will get Procrit with dialysis.  7.  Hypothyroidism.  Continue his Synthroid.  8.  Gastrointestinal and deep vein thrombosis prophylaxis.  On Prilosec and TED stockings.  Also ordered a Doppler for his left lower extremity for his swelling.   CODE STATUS:  FULL CODE.   CRITICAL CARE TIME SPENT ON HIS ADMISSION:  Is 60 minutes.         ____________________________ Gladstone Lighter, MD rk:ea D: 07/20/2012 22:07:03 ET T: 07/21/2012 00:06:41 ET JOB#: [id number]  cc: Gladstone Lighter, MD, <Dictator> Heinz Knuckles. Blocker, MD Trinda Pascal, MD Dr. Domenic Polite, MD Gladstone Lighter MD ELECTRONICALLY SIGNED 07/28/2012 16:00

## 2014-09-23 NOTE — Consult Note (Signed)
PATIENT NAME:  Robert [name], Robert [name] MR#:  [id number] DATE OF BIRTH:  [birth year]  DUPLICATE DICTATION SO IGNORE THIS ONE  ____________________________ Lucina Mellow. Manuella Ghazi, MD vss:TT D: 05/29/2014 14:29:00 ET T: 05/29/2014 15:33:18 ET JOB#: [id number]  cc: Janal Haak S. Manuella Ghazi, MD, <Dictator> Lucina Mellow [facility] MD ELECTRONICALLY SIGNED 05/30/2014 7:29

## 2014-09-23 NOTE — Consult Note (Signed)
PATIENT NAME:  Robert [name], Robert [name] MR#:  [id number] DATE OF BIRTH:  Oct 22, 1940  DATE OF CONSULTATION:  06/22/2013  CONSULTING PHYSICIAN:  Simonne Come. Inez Pilgrim, MD  Robert [name] is a 74 year old patient well known to me, last seen in the clinic on December 26. He has multiple medical problems, as reviewed in the admission note and the consultation notes. He from the hematology point of view has a history of splenic infarct, prothrombin mutation, was in the past on Coumadin, more recently felt to be a poor candidate for chronic anticoagulation. He has atrial fibrillation and has been followed by cardiology and has been on aspirin 81 mg twice a day for anticoagulation, and he runs mild chronic thrombocytopenia and a chronic anemia. 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 He was seen in the [facility] with plans to transfuse him on a p.r.n. basis to keep his hemoglobin above 7 grams or for symptomatic anemia. When last seen on December 26, he did not need a transfusion, as his strength and energy performance status was at his baseline.   PAST MEDICAL HISTORY: Includes end-stage renal disease, hypertension, right renal cell carcinoma with cryotherapy, transitional cell cancer of the bladder, diabetes, cardiomyopathy, pacemaker, coronary artery bypass surgery, peripheral vascular disease, GI bleed, Barrett's esophagus, ulcers, AV malformations, EGD and colonoscopy back in 2004, persistent anemia.   FAMILY HISTORY: Positive for lymphoma, melanoma, and breast cancer.  SOCIAL HISTORY: He had a positive history in the past for alcohol but no tobacco.   ALLERGIES: MORPHINE, CIPRO, CONTRAST DYE, DILAUDID, CELEBREX, NIACIN, AND MEVACOR.   Most recently, he had a vascular procedure on the right leg and was started also on Plavix in addition  to his aspirin.   On this background, the patient had apparently 2 or 3 days of bleeding due to dark stools, which was just called to medical attention as the patient had increasing weakness, dyspnea, and when hemoglobin was found to be 4, he was hospitalized. Has been transfused. On admission, his hemoglobin was 6.4 on January 20. Today the hemoglobin is 8. On admission, the white count was 5.5; the platelets were 132. He had cardiomegaly on the chest x-ray. He has been seen by GI. He has been allowed on liquid diet.   HOME MEDICATIONS: Have included finasteride 5 mg daily, aspirin 81 mg twice a day, recently Plavix daily, oxycodone 10 mg at night and 5 mg t.i.d. p.r.n., nitroglycerin p.r.n., sliding scale NovoLog, Requip 5 mg 4 times a day, Proventil 2 puffs q.6 hours, midodrine 5 mg p.o. every 4 hours, carisoprodol 350 mg by mouth at night, calcium 3 times a day, Prilosec 40 daily, Levoxyl , Rena-Vite daily, vitamin D3 at 2000 units daily, probiotic capsule daily, vitamin B-complex daily. He is on oxygen 2 liters nasal cannula.   REVIEW OF SYSTEMS: When seen, the patient was comfortable, not in any pain, alert, cooperative. Denied headache. No dizziness. No chills. No sweats. No ear or jaw pain. No cough. No wheezing. He has abdominal bloating for the last few weeks. He has had the dark stools for a few days as already noted. No increased edema. He has gangrene of the toes, and he has black toes on the third and fourth digits on the right foot. Currently denies pain. Denies that he is overly anxious or depressed. No rash or  pruritus. Denies focal weakness. He has chronic lower extremity weakness.   PHYSICAL EXAMINATION: GENERAL: Alert and cooperative. Some pallor, no jaundice.  LYMPH: No palpable lymph nodes in the neck or supraclavicular.  LUNGS: Clear. No wheezing or rales.  HEART: There is a systolic murmur.  ABDOMEN: Distended and tympanitic. No palpable mass or organomegaly.  EXTREMITIES: There  is some symmetric bilateral edema. There is symmetric bilateral lower extremity weakness. There are 2 blackened toes of the lower extremity as noted.   LABORATORY DATA: As noted above. The ferritin was 44 and the iron was 9% saturated.   IMPRESSION AND PLAN: From the hematology point of view, a patient who has had gastrointestinal bleed in the past, had ulcers, had arteriovenous malformations, had chronic iron deficiency. Is felt to be a poor candidate for chronic anticoagulation, even with atrial fibrillation and with a history of the prothrombin mutation and prior splenic infarct and peripheral vascular disease. He has been on aspirin twice a day from cardiology. He runs chronic mild low platelets in the range of 130 and a chronic anemia, even with iron and Procrit support, that is attributed to myelodysplastic syndrome. This is a clinical diagnosis. I have not done his bone marrow examination. He maintains his hemoglobin above 7 grams and has maintained a stable performance status, so he has not been transfused recently, but he can and would be transfused on a p.r.n. basis in the [facility] in the future for fall in hemoglobins or hemoglobin less than 7. There might be a role in the future, but none currently, of a bone marrow exam. There is no other treatment or investigations from the hematology point of view. He has gastrointestinal  bleed, and his Plavix and his aspirin have been held. He will be followed by GI. A decision will have to be made about how long to keep him off anticoagulation and whether or not or when he can restart aspirin, or if he could ever restart Plavix depending on the findings. Nothing else to offer acutely from hematology.   I had previously recommended that nephrology check the patient's CBC weekly, as they monitor him with Procrit and Venofer. For hemoglobin less than 7 grams, he should be referred back to the [facility] for transfusion, which he could tolerate 1 unit on  a p.r.n. basis. Otherwise, he was to be followed in the [facility] every 6 months. He has an appointment in approximately 5 months from now. He could be seen sooner if there is a change in either white cell or the platelet cell line or any other sign of change in hematologic status. Again, nothing acutely to offer from hem/onc.    ____________________________ Simonne Come. Inez Pilgrim, MD rgg:jcm D: 06/22/2013 17:26:47 ET T: 06/22/2013 18:34:25 ET JOB#: [id number]  cc: Simonne Come. Inez Pilgrim, MD, <Dictator> Dallas Schimke MD ELECTRONICALLY SIGNED 07/15/2013 18:20

## 2014-09-23 NOTE — H&P (Signed)
PATIENT NAME:  Robert [name], Robert [name] MR#:  [id number] DATE OF BIRTH:  1940-10-01  DATE OF ADMISSION:  06/21/2013  REFERRING PHYSICIAN:  Dr. Beather Arbour.  CHIEF COMPLAINT:  Shortness of breath.   HISTORY OF PRESENT ILLNESS:  A 74 year old Caucasian gentleman with history of coronary artery disease status post CABG, end-stage renal disease on hemodialysis, peripheral vascular disease presenting with shortness of breath, describes dyspnea on exertion of one week, gradually worsening with associated weakness and fatigue, told by the nephrologist to come to the Emergency Department secondary to anemia that was noted on today's labs, reportedly hemoglobin of 4.  Of note, the patient recently had an angio of lower extremities about two weeks ago and started on Plavix last week.  He has been describing dark stools for 2 to 3 days in duration believed to be secondary to Augmentin per patient.  Denies any abdominal pain, fevers, chills, chest pain, palpitations.  Does have lower extremity edema which was chronic.  Denies any orthopnea.  Denies any cough, fever, chills, currently without complaints.   REVIEW OF SYSTEMS:  CONSTITUTIONAL:  Denies fever, however positive for fatigue and generalized weakness.  EYES:  Denied blurred vision, double vision.  EARS, NOSE, THROAT:  Denies tinnitus, ear pain, hearing loss.  RESPIRATORY:  Positive for shortness of breath.  Denies cough, wheeze, hemoptysis.  CARDIOVASCULAR:  Denies chest pain, palpitations.  Positive for edema.  GASTROINTESTINAL:  Denies nausea, vomiting, diarrhea.  Positive for constipation.  Positive for melena.  GENITOURINARY:  Denies dysuria, hematuria.  ENDOCRINE:  Denies nocturia or thyroid problems.   HEMATOLOGY AND LYMPHATIC:  Denies easy bruising or known bleeding.  SKIN:  Denies rash or lesions.  MUSCULOSKELETAL:  Denies pain in neck, back, shoulder, knees, hips, arthritic symptoms.  NEUROLOGIC:  Denies paralysis, paresthesias.  PSYCHIATRIC:  Denies  anxiety or depressive symptoms.  Otherwise, full review of systems performed by me is negative.   PAST MEDICAL HISTORY:  Hypertension, hyperlipidemia, coronary artery disease status post CABG, peripheral vascular disease, ischemic cardiomyopathy, ejection fraction unknown at this time, end-stage renal disease on hemodialysis, COPD 2 liters nasal cannula baseline, diabetes, history of renal cell carcinoma, history of transitional cell carcinoma of the bladder as well as A. Fib.   SOCIAL HISTORY:  Remote tobacco use.  Denies alcohol or drug usage.   FAMILY HISTORY:  Positive for coronary artery disease.   ALLERGIES:  CELEBREX, CIPRO, CONTRAST DYE, DILAUDID, MEVACOR, MORPHINE AND NIACIN.   HOME MEDICATIONS:  Finasteride 5 mg daily, aspirin 81 mg by mouth twice daily, oxycodone 10 mg at bedtime, oxycodone 5 mg 1 tablet three times a day as needed for pain, Nitrostat 0.3 mg sublingual tablet q. 5 minutes as needed for chest pain, NovoLog sliding scale, Requip 5 mg 4 times daily, Proventil 90 mcg inhalation 2 puffs q. 6 hours as needed for shortness of breath, midodrine 5 mg by mouth every four hours while awake as needed for low blood pressure, carisoprodol 350 mg by mouth at bedtime, Co-Q 10 100 mg 2 capsules daily, calcium acetate 667 mg 3 times daily, 2 tablets at breakfast, 3 tablets with lunch, 3 tabs with dinner, 1 tablet with snacks, Prilosec 40 mg daily, Levoxyl 170 mcg daily, Rena-Vite 1 tablet daily, vitamin D3 2000 international units daily, oxygen 2 liters nasal cannula continuous, probiotic 4 1 capsule daily, vitamin B complex 200 mg daily.   PHYSICAL EXAMINATION: VITAL SIGNS:  Temperature 98.2, heart rate 94, respirations 18, blood pressure 98/42, saturating 97% on room air.  Weight 105.7 kg, BMI of 31.6.  GENERAL:  Well-nourished, well-developed, Caucasian gentleman, currently in no acute distress.  HEAD:  Normocephalic, atraumatic.  EYES:  Pupils equal, round, reactive to light.   Extraocular muscles intact.  No scleral icterus, however subconjunctival pallor.  MOUTH:  Moist mucosal membranes.  Dentition intact.  No abscess noted.  EAR, NOSE, THROAT:  Throat clear without exudates.  No external lesions.  NECK:  Supple.  No thyromegaly.  No nodules.  No JVD.  PULMONARY:  Clear to auscultation bilaterally without wheezes, rubs or rhonchi.  No use of accessory muscles.  Good respiratory effort.  CHEST:  Nontender to palpation.  CARDIOVASCULAR:  S1, S2, 3 out of 6 systolic ejection murmur, 1+ edema to the knees bilaterally.  Pedal pulses 2+.  GASTROINTESTINAL:  Soft, nontender, nondistended.  No masses.  Positive bowel sounds.  No hepatosplenomegaly.  MUSCULOSKELETAL:  No swelling, clubbing, edema.  Range of motion full in all extremities.  NEUROLOGIC:  Cranial nerves II through XII intact.  No gross focal neurological deficits.  Sensation intact.  Reflexes intact.  SKIN:  Skin ulceration of second and third digit of lower extremity.  No further lesions, rashes, cyanosis.  Skin warm, dry, turgor intact.  PSYCHIATRIC:  Mood and affect within normal limits.  The patient awake, alert and oriented x 3.  Insight and judgment intact.   LABORATORY DATA:  Sodium 138, potassium 4.8, chloride 105, bicarb 25, BUN 52, creatinine 6.6, glucose 144, troponin I 0.03.  WBC 5.5, hemoglobin 6.4, MCV 85, RDW 21, platelets 132.  Chest x-ray performed, cardiomegaly, no acute cardiopulmonary process.   ASSESSMENT AND PLAN:  A 74 year old gentleman with history of coronary artery disease, end-stage renal disease on dialysis, peripheral vascular disease, presenting with shortness of breath, found to be anemic. 1.  Symptomatic anemia.  Transfuse 1 unit of packed red blood cells, follow CBC q. 6 hours for trend.  Hold aspirin, Plavix.  2.  Gastrointestinal bleed.  A fecal occult blood test positive in the ER with dark stools.  Hold aspirin, Plavix.  Consult gastroenterology.  Place on Protonix IV twice  daily.  3.  Peripheral vascular disease.  We will consult vascular for medical management as he appears to be intolerant of current medications to see if there is any further options.   4.  End-stage renal disease on hemodialysis.  Consult nephrology.  Continue dialysis as previously scheduled Monday, Wednesday, Friday.  5.  Hypothyroidism.  Continue with Synthroid.  6.  VTE prophylaxis with SCDs.   7.  CODE STATUS:  THE PATIENT IS A FULL CODE.   TIME SPENT:  45 minutes.     ____________________________ Aaron Mose. Hower, MD dkh:ea D: 06/22/2013 01:28:09 ET T: 06/22/2013 02:40:55 ET JOB#: [id number]  cc: Aaron Mose. Hower, MD, <Dictator> DAVID Woodfin Ganja MD ELECTRONICALLY SIGNED 06/29/2013 23:53

## 2014-09-23 NOTE — Consult Note (Signed)
Chief Complaint:  Subjective/Chief Complaint patient seen for intermittant black stools in the setting of recent ancoagulation.  denies n/v or abdominalpain.  patient seen in endo.   VITAL SIGNS/ANCILLARY NOTES: **Vital Signs.:   27-Jan-15 06:03  Vital Signs Type Routine  Temperature Temperature (F) 97.9  Temperature Source oral  Pulse Pulse 81  Respirations Respirations 20  Systolic BP Systolic BP 017  Diastolic BP (mmHg) Diastolic BP (mmHg) 55  Mean BP 70  Pulse Ox % Pulse Ox % 100  Pulse Ox Activity Level  At rest  Oxygen Delivery 2L   Lab Results: Routine Coag:  27-Jan-15 06:53   Prothrombin  18.8  INR 1.6 (INR reference interval applies to patients on anticoagulant therapy. A single INR therapeutic range for coumarins is not optimal for all indications; however, the suggested range for most indications is 2.0 - 3.0. Exceptions to the INR Reference Range may include: Prosthetic heart valves, acute myocardial infarction, prevention of myocardial infarction, and combinations of aspirin and anticoagulant. The need for a higher or lower target INR must be assessed individually. Reference: The Pharmacology and Management of the Vitamin K  antagonists: the seventh ACCP Conference on Antithrombotic and Thrombolytic Therapy. BLTJQ.3009 Sept:126 (3suppl): [id number]. A HCT value >55% may artifactually increase the PT.  In one study,  the increase was an average of 25%. Reference:  "Effect on Routine and Special Coagulation Testing Values of Citrate Anticoagulant Adjustment in Patients with High HCT Values." American Journal of Clinical Pathology 2006;126:400-405.)  Routine Hem:  27-Jan-15 05:03   Hemoglobin (CBC)  8.6 (Result(s) reported on 28 Jun 2013 at 05:50AM.)    06:53   WBC (CBC)  3.4  RBC (CBC)  3.29  Hemoglobin (CBC)  8.4  Hematocrit (CBC)  27.6  Platelet Count (CBC)  74  MCV 84  MCH  25.6  MCHC  30.5  RDW  19.1  Neutrophil % 55.0  Lymphocyte % 26.5  Monocyte %  15.3  Eosinophil % 2.1  Basophil % 1.1  Neutrophil # 1.9  Lymphocyte #  0.9  Monocyte # 0.5  Eosinophil # 0.1  Basophil # 0.0 (Result(s) reported on 28 Jun 2013 at 07:17AM.)   Assessment/Plan:  Assessment/Plan:  Assessment 1) intermittant melena, anemia- patietn scheduled for egd today, however, pt noted increased and decreased plt.   2) PVD 3) diarrhea/loose stools-c diff negative.   Plan 1) will hold egd for now.  patient has been hemodynamically stable.  discussed with Dr Charissa Bash.  Will plan for ugi series tomorrow am. continue following clinically.   Electronic Signatures: Loistine Simas (MD)  (Signed 27-Jan-15 07:54)  Authored: Chief Complaint, VITAL SIGNS/ANCILLARY NOTES, Lab Results, Assessment/Plan   Last Updated: 27-Jan-15 07:54 by Loistine Simas (MD)

## 2014-09-23 NOTE — Consult Note (Signed)
Chief Complaint:  Subjective/Chief Complaint one bm since yesterday "brown, not black".  denies n,v or abdominal pain.   VITAL SIGNS/ANCILLARY NOTES: **Vital Signs.:   23-Jan-15 13:46  Vital Signs Type Post Dialysis  Celsius 36.5  Temperature Source oral  Pulse Pulse 93  Respirations Respirations 18  Systolic BP Systolic BP 412  Diastolic BP (mmHg) Diastolic BP (mmHg) 65  Mean BP 81  Pulse Ox % Pulse Ox % 100  Oxygen Delivery 2L   Brief Assessment:  Cardiac Irregular   Respiratory occasional coarse airway noise   Gastrointestinal details normal obese, non-tender non-distended, bs positive, unable to palpate internal organs.   Lab Results:  Routine Chem:  23-Jan-15 10:43   Phosphorus, Serum  5.0 (Result(s) reported on 24 Jun 2013 at 11:07AM.)  Routine Hem:  20-Jan-15 22:10   Hemoglobin (CBC)  6.4  21-Jan-15 06:25   Hemoglobin (CBC)  8.0    18:32   Hemoglobin (CBC)  6.0  22-Jan-15 05:33   Hemoglobin (CBC)  6.2    17:49   Hemoglobin (CBC)  7.0 (Result(s) reported on 23 Jun 2013 at 06:09PM.)  23-Jan-15 04:43   WBC (CBC) 4.9  RBC (CBC)  2.68  Hemoglobin (CBC)  6.8  Hematocrit (CBC)  22.0  Platelet Count (CBC)  107  MCV 82  MCH  25.3  MCHC  30.9  RDW  20.1  Neutrophil % 60.7  Lymphocyte % 21.4  Monocyte % 15.1  Eosinophil % 2.2  Basophil % 0.6  Neutrophil # 3.0  Lymphocyte # 1.0  Monocyte # 0.7  Eosinophil # 0.1  Basophil # 0.0 (Result(s) reported on 24 Jun 2013 at 05:26AM.)   Assessment/Plan:  Assessment/Plan:  Assessment 1) anemia-multifactorial-acd/GI blood loss.  stable, no black stools since admission.   2) multiple medical issues-ischemic CM, ascvd/ cad/cabg, copd, PVD, CKD/HD, copd, pacemaker, h/o renal cell ca,   Plan 1) planning egd for monday If HD is done before the scope, will need to wait until tuesday due to anesthesia .   I have discussed the risks beneftis and complicatiosn of egd to include not limited to bleeding infection perforation  and sedation and he wishes to proceed. Dr Candace Cruise covering over the weekend.   Electronic Signatures: Loistine Simas (MD)  (Signed 23-Jan-15 18:48)  Authored: Chief Complaint, VITAL SIGNS/ANCILLARY NOTES, Brief Assessment, Lab Results, Assessment/Plan   Last Updated: 23-Jan-15 18:48 by Loistine Simas (MD)

## 2014-09-23 NOTE — Op Note (Signed)
PATIENT NAME:  [name], [name] MR#:  [id number] DATE OF BIRTH:  1940/06/18  DATE OF PROCEDURE:  11/14/2013  PREOPERATIVE DIAGNOSIS: Cataract, right eye.   POSTOPERATIVE DIAGNOSIS: Cataract, right eye.   PROCEDURE PERFORMED: Extracapsular cataract extraction using phacoemulsification with placement of Alcon [id number] 23.5-diopter posterior chamber lens, serial number [id number].   SURGEON: Robert Back. Shiryl Ruddy, MD   ANESTHESIA: 4% lidocaine and 0.75% Marcaine in a 50-50 mixture with 10 units/mL of Hylenex added, given as a peribulbar.   ANESTHESIOLOGIST: Velora Heckler. Polin, MD  COMPLICATIONS: None.   ESTIMATED BLOOD LOSS: Less than 1 mL.   DESCRIPTION OF PROCEDURE:  The patient was brought to the operating room and given a peribulbar block.  The patient was then prepped and draped in the usual fashion.  The vertical rectus muscles were imbricated using 5-0 silk sutures.  These sutures were then clamped to the sterile drapes as bridle sutures.  A limbal peritomy was performed extending two clock hours and hemostasis was obtained with cautery.  A partial thickness scleral groove was made at the surgical limbus and dissected anteriorly in a lamellar dissection using an Alcon crescent knife.  The anterior chamber was entered superonasally with a Superblade and through the lamellar dissection with a 2.6 mm keratome.  DisCoVisc was used to replace the aqueous and a continuous tear capsulorrhexis was carried out.  Hydrodissection and hydrodelineation were carried out with balanced salt and a 27 gauge canula.  The nucleus was rotated to confirm the effectiveness of the hydrodissection.  Phacoemulsification was carried out using a divide-and-conquer technique.  Total ultrasound time was 1 minute and 27.7 seconds with an average power of 19.6 percent, CDE of 31.05.  Irrigation/aspiration was used to remove the residual cortex.  DisCoVisc was used to inflate the capsule and the internal incision was  enlarged to 3 mm with the crescent knife.  The intraocular lens was folded and inserted into the capsular bag using the Alcon AcrySert delivery system.  Irrigation/aspiration was used to remove the residual DisCoVisc.  Miostat was injected into the anterior chamber through the paracentesis track to inflate the anterior chamber and induce miosis.  0.1 mL of cefuroxime was injected via the paracentesis track containing 1 mg of drug.  The wound was checked for leaks and none were found. The conjunctiva was closed with cautery and the bridle sutures were removed.  Two drops of 0.3% Vigamox were placed on the eye.   An eye shield was placed on the eye.  The patient was discharged to the recovery room in good condition.   ____________________________ Robert Back [name], MD sad:jcm D: 11/14/2013 13:35:43 ET T: 11/14/2013 15:00:05 ET JOB#: [id number]  cc: Robert Lipps A. Ellene Bloodsaw, MD, <Dictator> Robert Lee MD ELECTRONICALLY SIGNED 11/21/2013 13:44

## 2014-09-23 NOTE — Consult Note (Signed)
General Aspect The patietn is a 74 year old gentleman with history of hypertension and end-stage renal disease, on hemodialysis, right renal cell carcinoma, diabetes, peripheral vascular disease, who was admitted December 28 initially to the floor with some increasing lethargy and inability to walk. Since admission his blood cultures have returned positive for gram-positive cocci in pairs and chains. He had a decompensation overnight on the floor and was transferred to the intensive care unit.   PAST MEDICAL HISTORY:  1.  Hypertension.  2.  Hyperlipidemia.  3.  Coronary artery disease.  4.  Status post CABG. 5.  Peripheral vascular disease. 6.  Ischemic cardiomyopathy.  7.  End-stage renal disease, on hemodialysis.  8.  COPD requiring 2 liters oxygen. 9.  Diabetes.  10.  History of renal cell carcinoma.  11.  History of transitional cell carcinoma of the bladder.  12.  Atrial fibrillatio   Home Medications: Medication Instructions Status  O2 @ 2 Liters [place] once a day (at bedtime) Active  finasteride 5 mg oral tablet 1 tab(s) orally once a week (on Wednesday) Active  gabapentin 100 mg oral capsule 1 cap(s) orally 3 times a day Active  ProAir HFA CFC free 90 mcg/inh inhalation aerosol 2 puff(s) inhaled 4 times a day, As Needed - for Wheezing, for Shortness of Breath  Active  Nitrostat 0.4 mg sublingual tablet 1 tab(s) sublingual every 5 minutes, As Needed - for Chest Pain Active  carbidopa-levodopa 25 mg-100 mg oral tablet, extended release 0.5 tab(s) orally 2 times a day Active  levothyroxine 175 mcg (0.175 mg) oral tablet 1 tab(s) orally once a day (in the morning) Active  midodrine 10 mg oral tablet 1 tab(s) orally 2 times a day Active  MiraLax - oral powder for reconstitution 17 gram(s) orally 4 times a week (on Tuesday, Thursday, Saturday, and Sunday) Active  oxyCODONE 5 mg oral tablet 1-2 tab(s) orally every 6 hours, As Needed - for Pain Active  [facility] Colon Health - oral capsule  1 cap(s) orally once a day (in the morning) Active  Rena-Vite Vitamin B Complex with C and Folic Acid oral tablet 1 tab(s) orally once a day (in the morning), 2 at lunch, and 2 at dinner.  Active  rOPINIRole 0.5 mg oral tablet 1 tab(s) orally 4 times a day Active    Morphine: Itching, Hives, Other  Cipro: Itching  Contrast - Iodinated Radiocontrast Dye: Other  Dilaudid: Alt Ment Status  Celebrex: Itching  Niacin: Hives, Itching  Mevacor: Other  Case History:  Family History Non-Contributory   Social History negative tobacco, negative ETOH, negative Illicit drugs   Review of Systems:  ROS Pt not able to provide ROS   Physical Exam:  GEN well developed, obese, critically ill appearing   HEENT dry oral mucosa, Oropharynx clear   NECK supple  trachea midline   RESP normal resp effort  postive use of accessory muscles   CARD LE edema present  positive JVD   VASCULAR ACCESS AV graft present  -- Purulent drainage   ABD denies tenderness  soft   EXTR pedal pulses nonpalpable feet warm to touch with 1-2 sec cap refilll   SKIN positive rashes, positive ulcers, erythema to calf, right midtoe ulcer crusted but no odor no drainage   NEURO follows commands, motor/sensory function intact   PSYCH alert, A+O to time, place, person   Nursing/Ancillary Notes: **Vital Signs.:   30-Dec-15 07:00  Vital Signs Type Routine  Temperature Temperature (F) 97.6  Celsius 36.4  Temperature Source oral  Pulse Pulse 88  Respirations Respirations 16  Systolic BP Systolic BP 96  Diastolic BP (mmHg) Diastolic BP (mmHg) 46  Mean BP 62  Pulse Ox % Pulse Ox % 99  Pulse Ox Activity Level  At rest  Oxygen Delivery 4L; Nasal Cannula   TDMs:  30-Dec-15 14:10   Vancomycin, Trough LAB 11 (Result(s) reported on 31 May 2014 at 03:07PM.)  Routine Chem:  30-Dec-15 03:12   Result Comment TROPONIN - RESULTS VERIFIED BY REPEAT TESTING.  - PREVIOUSLY CALLED TO [place] BY  - [place]  05/30/2014.  Result(s) reported on 31 May 2014 at 05:10AM.  Glucose, Serum  106  BUN  28  Creatinine (comp)  5.34  Sodium, Serum  134  Potassium, Serum 4.1  Chloride, Serum 99  CO2, Serum 28  Calcium (Total), Serum 9.2  Anion Gap 7  Osmolality (calc) 274  eGFR (African American)  14  eGFR (Non-African American)  11 (eGFR values <59m/min/1.73 m2 may be an indication of chronic kidney disease (CKD). Calculated eGFR, using the MRDR Study equation, is useful in  patients with stable renal function. The eGFR calculation will not be reliable in acutely ill patients when serum creatinine is changing rapidly. It is not useful in patients on dialysis. The eGFR calculation may not be applicable to patients at the low and high extremes of body sizes, pregnant women, and vegetarians.)    14:10   Phosphorus, Serum 3.9 (Result(s) reported on 31 May 2014 at 03:23PM.)  Cardiac:  30-Dec-15 03:12   CPK-MB, Serum 2.0 (Result(s) reported on 31 May 2014 at 04:33AM.)  Troponin I  0.56 (0.00-0.05 0.05 ng/mL or less: NEGATIVE  Repeat testing in 3-6 hrs  if clinically indicated. >0.05 ng/mL: POTENTIAL  MYOCARDIAL INJURY. Repeat  testing in 3-6 hrs if  clinically indicated. NOTE: An increase or decrease  of 30% or more on serial  testing suggests a  clinically important change)  Routine Hem:  30-Dec-15 03:12   WBC (CBC)  11.9  RBC (CBC)  3.83  Hemoglobin (CBC)  10.6  Hematocrit (CBC)  34.1  Platelet Count (CBC)  70  MCV 89  MCH 27.6  MCHC  31.0  RDW  18.3  Neutrophil % 74.1  Lymphocyte % 13.3  Monocyte % 11.5  Eosinophil % 0.8  Basophil % 0.3  Neutrophil #  8.8  Lymphocyte # 1.6  Monocyte #  1.4  Eosinophil # 0.1  Basophil # 0.0 (Result(s) reported on 31 May 2014 at 04:15AM.)   XRay:    29-Dec-15 09:42, Chest Portable Single View  Chest Portable Single View   REASON FOR EXAM:    shortness of breath  COMMENTS:       PROCEDURE: DXR - DXR PORTABLE CHEST SINGLE VIEW  - May 30 2014  9:42AM     CLINICAL DATA:  74year old with shortness of breath    EXAM:  PORTABLE CHEST - 1 VIEW    COMPARISON:  05/30/2014 and correlation with chest CT from  05/02/2014.    FINDINGS:  Median sternotomy wires, surgical clips and cervical fixation  hardware present.    The cardiac silhouette and mediastinal contours are unchanged. The  heart remains enlarged. Atherosclerotic aortic calcifications are  present.    There has been slight interval improvement in aeration of the left  upper lobe. Left lower lobe consolidation is slightly decreased from  prior. The pulmonary vessels are mildly prominent. Multiple  pulmonary nodules identified on recent chest  CT are not clearly  visible radiographically. There is no pneumothorax. The left  costophrenic angle is slightly blunted.    The osseous structures are unchanged.   IMPRESSION:  1. Slight interval improvementi in left hemithorax aeration. Left  lower lobe consolidation remains suspicious for mucus plugging or  aspiration/infectious pneumonitis.  2. Probable tiny left pleural effusion.  3. Cardiomegaly with associated pulmonary vascular congestion. No  definite overt pulmonary edema.      Electronically Signed    By: Rosemarie Ax    On: 05/30/2014 09:58         Verified By: Mickeal Needy, M.D.,    Impression 1.  Septecemia niether his right foot nor his AV graft appear to be an obvious source 2.   Atherosclerosis bilateral lower extremiteis his feet do not appear to be threatened at this tiem and I do not feel emergant interventin is indicated at this tiem 3.   ESRD on HD MWF: Pt seen during HD, tolerating well, BFR 400, UF target 2kg today.   4.  Generalized weakness:  cause unclear, troponins being trended to make sure not secondary to cardiac event, A. fib noted at present. 5.  AOCKD/anemia blood loss: hgb 10.1, hold epogen for now, will likely resume with next HD.  6.  SHPTH: phos acceptable at 4.3.    Plan level 3 consult   Electronic Signatures: Hortencia Pilar (MD)  (Signed 30-Dec-15 23:29)  Authored: General Aspect/Present Illness, Home Medications, Allergies, History and Physical Exam, Vital Signs, Labs, Radiology, Impression/Plan   Last Updated: 30-Dec-15 23:29 by Hortencia Pilar (MD)

## 2014-09-23 NOTE — H&P (Signed)
PATIENT NAME:  Robert [name], Robert [name] MR#:  [id number] DATE OF BIRTH:  Jan 04, 1941  DATE OF ADMISSION:  05/29/2014  PATIENT NAME:  Robert [name] MR#:  [id number] DATE OF BIRTH:  July 31, 1940  REFERRING PHYSICIAN:  Garnette Scheuermann, MD  CONSULTING PHYSICIAN:  Zoelle Markus S. Manuella Ghazi, MD  PRIMARY CARE PHYSICIAN: Clayborn Bigness, MD  ONCOLOGY: Barbette Reichmann, MD   CHIEF COMPLAINT: Weakness.   HISTORY OF PRESENT ILLNESS: The patient is a 74 year old male with a known history of end-stage renal disease on hemodialysis, coronary artery disease status post CABG and peripheral vascular disease. He is being admitted for possible non-ST-elevation MI. The patient was getting slower in terms of ambulation for which he was started on Sinemet 25/100 by his primary care physician on November 28th, one tablet. On follow up on December 10th this was increased to 2 tablets daily. Since then, the patient has been feeling more lethargic. He also had a change in his iron formula for replacement, which wife states has started making him more lethargic and weak. Last Saturday he had about 10 pounds of fluid removed at hemodialysis and the patient was asked to get admitted for removal of more fluids, at least another 5 to 10 pounds, per the patient's family, although when they called dialysis people here they said he does not need one, so he went back to his regular dialysis schedule last Monday when he was dialyzed. As per the patient's family, nurse gave him huge dose of iron which was a different formula. Since then, he has not been in his usual state of mind. He has been sleeping a lot. He slept all day Tuesday. On Wednesday he could not do much and could not move and would not wake up, so family canceled his dialysis and took him for dialysis on Thursday instead of Wednesday. As per family, he has been also more delusional and not been able to walk since then. Friday, nobody really paid much attention to him due to holiday. On Saturday,  they took him again for dialysis in his motorized wheelchair, but he has just been going downhill. He was doing okay on Sunday, but then again last night he was very weak, lethargic, not able to move and family decided to bring him to the Emergency Department. In the ED, he was noted to have elevated troponin and he is being admitted for further evaluation and management. He is also due for dialysis.   PAST MEDICAL HISTORY:  1.  Hypertension.  2.  Hyperlipidemia.  3.  Coronary artery disease.  4.  Status post CABG. 5.  Peripheral vascular disease. 6.  Ischemic cardiomyopathy.  7.  End-stage renal disease, on hemodialysis.  8.  COPD requiring 2 liters oxygen. 9.  Diabetes.  10.  History of renal cell carcinoma.  11.  History of transitional cell carcinoma of the bladder.  12.  Atrial fibrillation.   SOCIAL HISTORY: Remote tobacco use. No alcohol or drug use.   FAMILY HISTORY: Positive for coronary artery disease.   ALLERGIES: CELEBREX, CIPRO, CONTRAST DYE, DILAUDID, MEVACOR, MORPHINE AND NIACIN.   REVIEW OF SYSTEMS: Unobtainable, as the patient is somewhat lethargic.   MEDICATIONS AT HOME:  1. Carbidopa/levodopa 25/100 half tablet p.o. twice a day.  2. Finasteride 5 mg p.o. daily  3. Gabapentin 100 mg p.o. 3 times a day.  4. Levothyroxine 175 mcg p.o. daily.  5. Midodrine 10 mg p.o. b.i.d.  6. MiraLax 4 times a week on Tuesday, Thursday, Saturday, and Sunday.  7. Nitrostat sublingual every 5 minutes as needed.  8. Oxygen 2 liters via nasal cannula at bedtime.  9. Oxycodone 5 mg p.o. 1-2 tablets every 6 hours as needed.  10. Multivitamins once daily.  11. ProAir 2 puffs inhaled 4 times a day as needed.  12. Renovite 2 at lunch, 2 at dinner, and 1 in the morning.  13. Ropinirole 0.5 mg p.o. 4 times a day.   PHYSICAL EXAMINATION:  VITAL SIGNS: Temperature 97.7, heart rate 95 per minute, respirations 20 per minute, blood pressure 102/57, he is saturating 92% on room air.    GENERAL:  The patient is a 74 year old male lying in the bed, somewhat lethargic.  EYES: Pupils are equal, round, reactive to light and accommodation. No scleral icterus.  NECK: Supple. No JVD, no thyroid enlargement or tenderness.  HEENT: Head atraumatic, normocephalic. Oropharynx and nasopharynx clear.  LUNGS: Using accessory muscles of respiration. No wheezing, rales, rhonchi, crepitation, decreased breath sounds at the bases.  CARDIOVASCULAR: S1, S2 normal. He does have 3/6 systolic ejection murmur at apical area.   ABDOMEN: Soft, nontender, nondistended. Bowel sounds present. No organomegaly or mass.  EXTREMITIES: He has 1 + pedal edema bilaterally. No cyanosis, clubbing.  NEUROLOGIC: Cranial nerves II through XII seem intact. Muscle strength is 3-4/5 throughout. Sensation intact. Reflexes diminished.  SKIN:   Warm and dry.  PSYCHIATRY: The patient seems somewhat lethargic, although he was able to wake up, but falls back asleep easily.    LABORATORY PANEL: Normal CBC except hemoglobin 10.1, hematocrit 33.0, platelets 75,000. PT 17.8, INR 1.5, PTT 38.1. Cardiac enzymes showed CK of 323, MB 5.9, troponin 0.17. Normal BMP except BUN of 27, creatinine 5.76. LFTs showed alkaline phosphatase of 190.   Chest x-ray in the Emergency Department showed chronic cardiomegaly. Small effusion on the left. No other acute changes.   EKG showed no acute ST-T changes.   IMPRESSION AND PLAN:  1.  Generalized weakness likely multifactorial, could be cardiac event although doubt it at this time. We will get physical therapy evaluation and management.  2.  Suspected non-ST elevation myocardial infarction.  First troponin is up, get serial troponins at this time for rule out, monitor him on telemetry, hold on full dose anticoagulation.  3.  Metabolic encephalopathy, likely multifactorial. Family feels this could be Sinemet doing it and/or and iron formula which was recently changed about a week ago as per  family. We will cut back on dose of carbidopa/levodopa and monitor his mental status.  4.  End-stage renal disease, on hemodialysis. We will consult nephrology for dialysis tonight, he is scheduled for Monday, Wednesday, Friday so he will get one today.  5.  Hypothyroidism. We will check TSH, continue Synthroid.  6.  Code status, full code. We will consult palliative care for goals of care discussion.    TOTAL TIME TAKING CARE OF THIS PATIENT: 55 minutes.    ____________________________ Lucina Mellow. Manuella Ghazi, MD vss:bu D: 05/29/2014 14:35:02 ET T: 05/29/2014 15:11:21 ET JOB#: [id number]  cc: Charbel Los S. Manuella Ghazi, MD, <Dictator> Lavera Guise, MD Simonne Come. Inez Pilgrim, MD Remer Macho MD ELECTRONICALLY SIGNED 05/30/2014 7:29

## 2014-09-23 NOTE — Op Note (Signed)
PATIENT NAME:  Robert [name], Robert [name] MR#:  [id number] DATE OF BIRTH:  Aug 01, 1940  DATE OF PROCEDURE:  12/20/2013  PREOPERATIVE DIAGNOSES: 1.  Complication of AV dialysis device.  2.  End-stage renal disease requiring hemodialysis.  3.  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   POSTOPERATIVE DIAGNOSES:  1.  Complication of AV dialysis device.  2.  End-stage renal disease requiring hemodialysis.  3.  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   PROCEDURES PERFORMED: 1.  Contrast injection to left arm brachial axillary dialysis graft.  2.  Percutaneous transluminal angioplasty and stent placement, left AV graft venous anastomosis.   SURGEON: Hortencia Pilar, MD  SEDATION: Versed 5 mg plus fentanyl 200 mcg administered IV. Continuous ECG, pulse oximetry and cardiopulmonary monitoring is performed throughout the entire procedure by the interventional radiology nurse. Total sedation time was 2 hours.   ACCESS: A 7-French sheath, antegrade direction, left arm AV graft.   TOTAL CONTRAST USED: 85 mL Isovue.   FLUOROSCOPY TIME: 10.6 minutes.   INDICATIONS: Robert [name] is a 74 year old gentleman with multiple medical problems who presents with worsening difficulties regarding dialysis and his AV access. He is having prolonged bleeding and increased recirculation. Noninvasive studies demonstrated a high-grade lesion at the venous anastomosis. He also has nonhealing wounds of his foot and rest pain on the right side with documented atherosclerotic changes. He is undergoing both treatment of his fistula as well as treatment of his leg. These 2 notes will be dictated separately with the AV access intervention first.   Risks and benefits were reviewed. All questions answered. The patient has agreed to proceed.   DESCRIPTION OF PROCEDURE: The patient is taken to special procedures  and placed in the supine position and then bolstered with a wedge and pillows. His left arm is then extended palm upward and prepped and draped in a sterile fashion. Appropriate timeout is called.   Then 1% lidocaine is infiltrated in the soft tissues and access to the AV graft is obtained using a microneedle, microwire followed by microsheath, J-wire followed by a 6-French sheath. Hand injection of contrast then demonstrates that at the venous anastomosis, at the leading edge of a previously placed stent, there is a very sharp angulated kink with marked narrowing. Given this situation, 2000 units of heparin was given and a Magic torque wire was advanced across the lesion. A straight FLAIR stent, 8 x 50, is then opened and prepped on the field, advanced over the wire, positioned across this lesion and deployed without difficulty. It is then postdilated with an 8 x 4 balloon. Three separate inflations are made, all in continuity. Inflations are to 14 atmospheres, to 16 atmospheres, and all are for approximately 1 minute. Follow-up imaging demonstrates there is complete resolution of this area with a nice smooth contour and improved flow of blood through the lesion. Central veins are imaged and they are widely patent. With the 8 mm balloon inflated, reflux imaging is performed and demonstrates a pseudoaneurysm, but the arterial portion is patent, and the visualized portions of the brachial artery are patent.   A pursestring suture is placed around the sheath, sheath is removed, light pressure is held, and there are no immediate complications.   INTERPRETATION: Initial images demonstrate a lesion consistent with the findings on ultrasound. It is right at the venous anastomosis. This is involving a previously placed stent and therefore I did not feel that balloon  angioplasty would be of merit. A FLAIR stent is deployed and then postdilated which yields an excellent result.   SUMMARY: Successful salvage of left  arm brachial axillary dialysis graft with treatment as noted above.  The patient will undergo treatment of his lower extremity occlusive disease, and this will be dictated as a separate note, although they are co-joined procedures.   ____________________________ Katha Cabal, MD ggs:sb D: 12/20/2013 10:29:18 ET T: 12/20/2013 11:28:23 ET JOB#: [id number]  cc: Katha Cabal, MD, <Dictator> Katha Cabal MD ELECTRONICALLY SIGNED 12/27/2013 12:23

## 2014-09-23 NOTE — Op Note (Signed)
PATIENT NAME:  Robert [name], Robert [name] MR#:  [id number] DATE OF BIRTH:  12/14/1940  DATE OF PROCEDURE:  06/08/2013  PREOPERATIVE DIAGNOSIS:  1.  Atherosclerotic occlusive disease, bilateral lower extremities, with ulceration of the right foot.  2.  End-stage renal disease requiring hemodialysis.  3.  Congestive heart failure.   4.  Coronary artery disease.  5.  Diabetes.   POSTOPERATIVE DIAGNOSIS:  1.  Atherosclerotic occlusive disease, bilateral lower extremities, with ulceration of the right foot.  2.  End-stage renal disease requiring hemodialysis.  3.  Congestive heart failure.   4.  Coronary artery disease.  5.  Diabetes.   PROCEDURES PERFORMED: 1.  Abdominal aortogram.  2.  Right lower extremity distal runoff, third order catheter placement.  3.  Percutaneous transluminal angioplasty of the right posterior tibial artery to 3 mm.  4.  Percutaneous transluminal angioplasty of the right common femoral and superficial femoral artery to 5 mm using Lutonix  balloon.  5.  Percutaneous transluminal angioplasty to 8 mm, right common iliac artery.  6.  Transvascular pressure measurements right iliac system.   PROCEDURE PERFORMED: Robert Cabal, MD.   SEDATION: Versed 5 mg plus fentanyl 200 mcg administered IV. Continuous ECG, pulse oximetry and cardiopulmonary monitoring was performed throughout the entire procedure by the interventional radiology nurse. Total sedation time was 1 hour 40 minutes.   ACCESS: 6 French sheath left common femoral artery.   CONTRAST USED: Isovue 142 mL.   FLUOROSCOPY TIME: 16.9 minutes.   INDICATIONS: Robert [name] is a 74 year old gentleman who presents with ulcerations and cellulitis of his right foot. Physical examination and noninvasive studies of demonstrated a profound atherosclerotic occlusive disease and he is therefore undergoing angiography with the hope for intervention for limb salvage. The risks and benefits were reviewed. All questions are  answered. The patient has agreed to proceed.   DESCRIPTION OF PROCEDURE: The patient is taken to special procedures and placed in the supine position. After adequate sedation is achieved, his groins are prepped and draped in sterile fashion. Ultrasound is placed in a sterile sleeve. Ultrasound is utilized secondary to lack of appropriate landmarks and to avoid vascular injury. Under direct ultrasound visualization his common femoral artery is identified. It is echolucent and pulsatile, some posterior plaque is noted, but this indicates patency. Image is recorded for the permanent record. Lidocaine 1% is infiltrated in the soft tissues and access to the common femoral artery is obtained with a micropuncture needle, microwire followed by micro sheath, stiffened micro sheath is needed. Amplatz Super Stiff wire is then advanced and 5 [place] sheath is placed. The wire and Kumpe catheter are then negotiated into the aorta where the Kumpe catheter was exchanged for a pigtail catheter. AP projection of the aorta is obtained.   Pigtail catheter is repositioned to above the bifurcation and LAO projection of the pelvis is obtained. Using a rim catheter and stiff angled Glidewire, the aortic bifurcation is crossed and the catheter wire is negotiated down into the profunda femoris. Rim catheter is advanced and subsequently the Amplatz Super Stiff wire is exchanged for the Glidewire. Rim catheter is removed and a 6 [place] high flex Ansel sheath is advanced up and over the bifurcation, positioned with its tip in the distal external iliac. Hand injection of contrast in the RAO projection demonstrates the groin. The distal runoff is then obtained and subsequently a 145 straight slip catheter and the Glidewire are negotiated down to the level of the popliteal where hand injection of contrast  demonstrates the tibial vessels. The anterior tibial is a somewhat small, posterior tibial is dominant but in its midportion, approximately  4 to 5 cm above the ankle, there is a short segment stenosis of greater than 90%. A V-18 wire is then negotiated down into the posterior tibial and positioned with the tip of the wire at the level of the ankle. Heparin 4000 units is given, an additional 1000 was given later in the case. A 3 x 8 balloon is then advanced over the wire and positioned across the posterior tibial lesion. It is inflated to 12 atmospheres for 2 minutes. Follow-up angiography demonstrates complete resolution of the stenosis less than 5% residual stenosis noted.   A 4 x 30 Fox balloon is then used to angioplasty the superficial femoral artery. Two separate inflations are made to 14 atmospheres. Follow-up angiography demonstrates significant improvement; however, there continues to be high-grade, greater than 60%, residual stenosis at multiple spots. Again, it should be noted that the patient's calcific disease is massive. His entire arterial tree is usual easily visualized under just plain fluoroscopy.   Given the appearance of the common femoral and the superficial femoral, it is elected to use the Lutonix balloons, 5 x 10 mm balloons are selected. A total of 3 are utilized beginning in the proximal common femoral and extending through the superficial femoral to the above-knee popliteal. A total of four inflations are made. Each inflation is to 12 atmospheres for three full minutes. Follow-up imaging demonstrates significant improvement with a dramatic improvement in flow of blood through the SFA. Distal runoff is preserved.   Pressure transducer is then hooked to be sheath. The V-18 wire still present, and a pressure transducer is zeroed and then pull backs are performed. Pressure measurements within the external iliac and common iliac are on the order of 90 systolic with the distal aorta being 130. The sheath is readvanced and the pressure change appears to be at the origin of the common iliac. On imaging, there is calcific disease  present, but his breathing and his extensive a bowel gas make characterizing the lesion very difficult. Therefore, I elected to use an 8 x 4 balloon,  advanced across the proximal common iliac on the right. It is inflated to 12 atmospheres. It is left in place for two minutes. On follow-up imaging, full-strength contrast is and there appears to be wide patency of the iliac at this time and no evidence of dissection. Therefore, I elected not to place a stent from the left-sided approach.   The sheath is then exchanged for an 11 cm 6 French sheath over a J-wire and an oblique view of the left groin is obtained, and a Mynx device is deployed without difficulty. There are no immediate complications.   INTERPRETATION: Initially the aorta is noted to be mildly aneurysmal. There appears to be abnormality of the common iliac, later this is confirmed with pressure gradient. The distal common iliac and external iliac appear widely patent. Common femoral is diffusely diseased, greater than 70% in some spots. The profunda femoris is diseased, but there does not appear to be hemodynamically significant stenoses. The superficial femoral artery is diffusely diseased with multiple subtotal occlusions throughout its course. The popliteal demonstrates some acentric calcific disease, but otherwise appears quite good. The trifurcation is patent; however, the anterior tibial is quite small and tapers down toward the dorsalis pedis and does not appear to be the dominant runoff to the foot. The posterior tibial does appear to be  the dominant runoff maintaining its size of 3 mm, all the way down to the lateral plantar which fills the pedal arch; however, in the midportion of the posterior tibial there is a subtotal occlusion.   Following angioplasty as described above to 3 mm in the posterior tibial, 5 mm in the common femoral and superficial femoral and 8 mm in the common iliac, there is significant improvement if not resolution of  these areas and now a much more rapid flow of contrast from the level of the proximal arterial system to the foot.   SUMMARY: Successful recanalization of the right lower extremity for treatment of ulcerations and ischemia and limb salvage.     ____________________________ Robert Cabal, MD ggs:cc D: 06/08/2013 18:26:08 ET T: 06/08/2013 21:22:08 ET JOB#: [id number]  cc: Robert Cabal, MD, <Dictator> Isla Pence, MD Dr. Tammi Klippel MD ELECTRONICALLY SIGNED 06/16/2013 9:47

## 2014-09-23 NOTE — Consult Note (Signed)
Brief Consult Note: Diagnosis: Gi bleed.   Patient was seen by consultant.   Consult note dictated.   Comments: Appreciate consult for 74 y/o caucasian man with history of PVD s/p recent right femoral stenting/necrotic right toes, CABG, atrial fibrillation,  ESRD on HD MWF, COPD,hx renal cell carcinoma s/p cryotherapy, DM, and ischemic cardiomyopathy for evalution of melena. Patient was started on plavix last week due to PVD requirements. States that he noted black stools over the last 2-3d, last was yesterday.  Denies abdominal pain, dyspepsia, and all other GI complaints. States he has had cspy but never had EGD. Evidentally hgb was 4 outside of hospital, 6.4 on ED arrival. Was up to 8 today after xfusion. Currently receiving dialysis.  Impression and plan. Melena. Concerning for UGI bleed. Agree with holding anticoagulation for now. Agree with bid IV PPI.  Will likely need EGD when clinically feasible. Would continue to follow hgb and xfuse prn.  Electronic Signatures: Stephens November H (NP)  (Signed 21-Jan-15 16:33)  Authored: Brief Consult Note   Last Updated: 21-Jan-15 16:33 by Theodore Demark (NP)

## 2014-09-23 NOTE — Consult Note (Signed)
Chief Complaint:  Subjective/Chief Complaint NO ACUTE COMPLAINTS   VITAL SIGNS/ANCILLARY NOTES: **Vital Signs.:   29-Jan-15 13:45  Vital Signs Type Routine  Temperature Temperature (F) 97.9  Celsius 36.6  Temperature Source oral  Pulse Pulse 89  Respirations Respirations 18  Systolic BP Systolic BP 91  Diastolic BP (mmHg) Diastolic BP (mmHg) 50  Mean BP 63  Pulse Ox % Pulse Ox % 94  Pulse Ox Activity Level  At rest  Oxygen Delivery 2L   Brief Assessment:  Respiratory normal resp effort   Gastrointestinal details normal Soft  Nontender   Additional Physical Exam NEURO GROSSLY NON FOCAL   Lab Results: Routine BB:  29-Jan-15 07:23   Platelets (Blood Component) Ready (Result(s) reported on 30 Jun 2013 at 08:46AM.)  Routine Chem:  29-Jan-15 05:22   Glucose, Serum 91  BUN 16  Creatinine (comp)  6.31  Sodium, Serum  135  Potassium, Serum 3.8  Chloride, Serum 98  CO2, Serum  35  Calcium (Total), Serum 9.1  Anion Gap  2  Osmolality (calc) 271  eGFR (African American)  9  eGFR (Non-African American)  8 (eGFR values <62mL/min/1.73 m2 may be an indication of chronic kidney disease (CKD). Calculated eGFR is useful in patients with stable renal function. The eGFR calculation will not be reliable in acutely ill patients when serum creatinine is changing rapidly. It is not useful in  patients on dialysis. The eGFR calculation may not be applicable to patients at the low and high extremes of body sizes, pregnant women, and vegetarians.)  Routine Coag:  29-Jan-15 05:22   Prothrombin  19.1  INR 1.6 (INR reference interval applies to patients on anticoagulant therapy. A single INR therapeutic range for coumarins is not optimal for all indications; however, the suggested range for most indications is 2.0 - 3.0. Exceptions to the INR Reference Range may include: Prosthetic heart valves, acute myocardial infarction, prevention of myocardial infarction, and combinations of  aspirin and anticoagulant. The need for a higher or lower target INR must be assessed individually. Reference: The Pharmacology and Management of the Vitamin K  antagonists: the seventh ACCP Conference on Antithrombotic and Thrombolytic Therapy. NZVJK.8206 Sept:126 (3suppl): [id number]. A HCT value >55% may artifactually increase the PT.  In one study,  the increase was an average of 25%. Reference:  "Effect on Routine and Special Coagulation Testing Values of Citrate Anticoagulant Adjustment in Patients with High HCT Values." American Journal of Clinical Pathology 2006;126:400-405.)  Routine Hem:  29-Jan-15 05:22   WBC (CBC) 4.2  RBC (CBC)  3.19  Hemoglobin (CBC)  8.4  Hematocrit (CBC)  26.8  Platelet Count (CBC)  56  MCV 84  MCH 26.3  MCHC  31.3  RDW  19.2  Neutrophil % 57.1  Lymphocyte % 22.3  Monocyte % 18.9  Eosinophil % 1.3  Basophil % 0.4  Neutrophil # 2.4  Lymphocyte #  0.9  Monocyte # 0.8  Eosinophil # 0.1  Basophil # 0.0 (Result(s) reported on 30 Jun 2013 at 06:10AM.)   Assessment/Plan:  Assessment/Plan:  Assessment SEE ALSO INITIAL CONSULT..SEEN EARLIER TODAY DISCUSSED WITH GI AND MEDICINE.  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 NO ROLE CURRENTLY FOR BM EXAM AS NOT A CANDIDATE CURRENTLY FOR CHEMOTX. WOULD ALSO LOOK AT POSSIBLE EPO NEUTRALIZING ANTIBODIES. CONTINUE TO TRANSFUSE PRN FOR SYMPTOMATIC ANEMIA OR HGB BELOW 7G   Electronic Signatures: Dallas Schimke (MD)  (Signed 29-Jan-15  22:54)  Authored: Chief Complaint, VITAL SIGNS/ANCILLARY NOTES, Brief Assessment, Lab Results, Assessment/Plan   Last Updated: 29-Jan-15 22:54 by Dallas Schimke (MD)

## 2014-09-23 NOTE — Consult Note (Signed)
Brief Consult Note: Diagnosis: GI bleed  Atherosclerosis bilateral lower extremities.   Recommend further assessment or treatment.   Orders entered.   Discussed with Attending MD.   Comments: I have spoken with Dr Donnella Sham and given the GI bleed he must be off his antiplatelet medications regardless of his recent intervention.  Electronic Signatures: Hortencia Pilar (MD)  (Signed 22-Jan-15 17:04)  Authored: Brief Consult Note   Last Updated: 22-Jan-15 17:04 by Hortencia Pilar (MD)

## 2014-09-23 NOTE — Consult Note (Signed)
Chief Complaint:  Subjective/Chief Complaint seen for melena.  Denies abdominal pain, nausea or vomiting.  no bm overnight, no overt evidence of gi bleeding.; some shortness of breath this am.   VITAL SIGNS/ANCILLARY NOTES: **Vital Signs.:   22-Jan-15 09:25  Temperature Temperature (F) 97.4  Celsius 36.3  Pulse Pulse 81  Respirations Respirations 18  Systolic BP Systolic BP 836  Diastolic BP (mmHg) Diastolic BP (mmHg) 91  Mean BP 95  Pulse Ox % Pulse Ox % 92  Oxygen Delivery 2L   Brief Assessment:  Cardiac Irregular   Respiratory mild bibasilar crackles   Gastrointestinal details normal Nontender  Nondistended  Bowel sounds normal  obese, unable to palpate internal organs.   Lab Results: Routine Micro:  21-Jan-15 16:46   Organism Name [place]  Organism Name GRAM NEGATIVE ROD  Organism Quantity MODERATE GROWTH  Organism Quantity MODERATE GROWTH  Micro Text Report WOUND AER/ANAEROBIC CULT   ORGANISM 1                MODERATE GROWTH GRAM NEGATIVE ROD   ORGANISM 2                MODERATE GROWTH GRAM NEGATIVE ROD   COMMENT                   ID TO FOLLOW SENSITIVITIES TO FOLLOW   COMMENT                   TWO DIFFERENT COLONY TYPES   ANTIBIOTIC                       Specimen Source RIGHT FOOT  Organism 1 MODERATE GROWTH GRAM NEGATIVE ROD  Organism 2 MODERATE GROWTH GRAM NEGATIVE ROD  Culture Comment ID TO FOLLOW SENSITIVITIES TO FOLLOW  Culture Comment . TWO DIFFERENT COLONY TYPES  Result(s) reported on 23 Jun 2013 at 10:43AM.  Routine Chem:  22-Jan-15 05:33   Glucose, Serum 94  BUN  32  Creatinine (comp)  4.80  Sodium, Serum 136  Potassium, Serum 4.2  Chloride, Serum 99  CO2, Serum 31  Calcium (Total), Serum 8.9  Anion Gap  6  Osmolality (calc) 279  eGFR (African American)  13  eGFR (Non-African American)  11 (eGFR values <39mL/min/1.73 m2 may be an indication of chronic kidney disease (CKD). Calculated eGFR is useful in patients with stable renal  function. The eGFR calculation will not be reliable in acutely ill patients when serum creatinine is changing rapidly. It is not useful in  patients on dialysis. The eGFR calculation may not be applicable to patients at the low and high extremes of body sizes, pregnant women, and vegetarians.)  Routine Hem:  20-Jan-15 22:10   Hemoglobin (CBC)  6.4  Platelet Count (CBC)  132  21-Jan-15 06:25   Hemoglobin (CBC)  8.0  Platelet Count (CBC)  95    18:32   Hemoglobin (CBC)  6.0  Platelet Count (CBC)  115  22-Jan-15 05:33   WBC (CBC) 4.2  RBC (CBC)  2.44  Hemoglobin (CBC)  6.2  Hematocrit (CBC)  20.5  Platelet Count (CBC)  104  MCV 84  MCH  25.5  MCHC  30.3  RDW  20.3  Neutrophil % 63.1  Lymphocyte % 20.2  Monocyte % 13.8  Eosinophil % 2.1  Basophil % 0.8  Neutrophil # 2.7  Lymphocyte #  0.9  Monocyte # 0.6  Eosinophil # 0.1  Basophil # 0.0 (Result(s) reported  on 23 Jun 2013 at 06:34AM.)   Radiology Results: XRay:    21-Jan-15 00:29, Chest Portable Single View  Chest Portable Single View   REASON FOR EXAM:    GI BLEED  COMMENTS:       PROCEDURE: DXR - DXR PORTABLE CHEST SINGLE VIEW  - Jun 22 2013 12:29AM     CLINICAL DATA:  Dizziness.  GI bleeding.    EXAM:  PORTABLE CHEST - 1 VIEW    COMPARISON:  DG CHEST 2V dated 07/25/2012; DG CHEST 1V PORT dated  07/20/2012    FINDINGS:  Suspect underline hyperinflation. Midline trachea. Moderate  cardiomegaly with median sternotomy. Atherosclerosis in the  transverse aorta. No pleural effusion or pneumothorax. No congestive  failure. No lobar consolidation.     IMPRESSION:  Cardiomegaly, without acute disease.      Electronically Signed    By: Abigail Miyamoto M.D.    On: 06/22/2013 00:33         Verified By: Areta Haber, M.D.,   Assessment/Plan:  Assessment/Plan:  Assessment 1) melena, anemia.  no recurrent melena since admission. Patietn recently placed on plavix for new iliac stent.  Appreciate nephrology and  onc, IM assistance Patietn with h/o chronic anemia, GI AVMs, transfusion requiring.  Having tfx of a unit of prbc this am.   2) patietn saying some increase shortness of breath this am. 3) multiple medical problems with CAD/CABG, PVD, CKD/HD, copd, pacemaker, h/o renal cell ca, ischemic cardiomyopathy, htn, hyperlipidemia   Plan 1) as patietn is currently stable without evidence of ongoing bleedihng and now off plavix, continue current, will do EGD in a couple days once effect of plavix is lessened. continue ppi as you are.  following.   Electronic Signatures: Loistine Simas (MD)  (Signed 22-Jan-15 11:05)  Authored: Chief Complaint, VITAL SIGNS/ANCILLARY NOTES, Brief Assessment, Lab Results, Radiology Results, Assessment/Plan   Last Updated: 22-Jan-15 11:05 by Loistine Simas (MD)

## 2014-09-23 NOTE — Consult Note (Signed)
Chief Complaint:  Subjective/Chief Complaint Covering for Dr. Skulskie. Still with small amount of melena. Hgb 7.4 this AM. Had HD yest. C/O mainly of pain in foot. Requests pain meds.   VITAL SIGNS/ANCILLARY NOTES: **Vital Signs.:   24-Jan-15 07:01  Vital Signs Type Routine  Temperature Temperature (F) 98.1  Celsius 36.7  Pulse Pulse 89  Respirations Respirations 18  Systolic BP Systolic BP 110  Diastolic BP (mmHg) Diastolic BP (mmHg) 61  Mean BP 77  Pulse Ox % Pulse Ox % 96  Pulse Ox Activity Level  At rest  Oxygen Delivery 2L; Nasal Cannula   Brief Assessment:  GEN no acute distress   Cardiac Regular   Respiratory clear BS   Gastrointestinal Normal   Additional Physical Exam Necrotic appearing right toes.   Lab Results: Routine Chem:  24-Jan-15 05:49   Glucose, Serum 84  BUN  22  Creatinine (comp)  4.98  Sodium, Serum 140  Potassium, Serum 3.9  Chloride, Serum 101  CO2, Serum 31  Calcium (Total), Serum 8.9  Anion Gap 8  Osmolality (calc) 282  eGFR (African American)  12  eGFR (Non-African American)  11 (eGFR values <60mL/min/1.73 m2 may be an indication of chronic kidney disease (CKD). Calculated eGFR is useful in patients with stable renal function. The eGFR calculation will not be reliable in acutely ill patients when serum creatinine is changing rapidly. It is not useful in  patients on dialysis. The eGFR calculation may not be applicable to patients at the low and high extremes of body sizes, pregnant women, and vegetarians.)  Routine Hem:  24-Jan-15 05:49   WBC (CBC) 4.4  RBC (CBC)  2.92  Hemoglobin (CBC)  7.4  Hematocrit (CBC)  24.4  Platelet Count (CBC)  97  MCV 83  MCH  25.4  MCHC  30.5  RDW  20.3  Neutrophil % 63.5  Lymphocyte % 19.5  Monocyte % 15.2  Eosinophil % 1.1  Basophil % 0.7  Neutrophil # 2.8  Lymphocyte #  0.9  Monocyte # 0.7  Eosinophil # 0.0  Basophil # 0.0 (Result(s) reported on 25 Jun 2013 at 06:36AM.)    Assessment/Plan:  Assessment/Plan:  Assessment Melena. Was on plavix. On PPI.   Plan EGD either Monday or Tuesday depending on time of dialysis. Oxycodone prn for foot pain. THanks   Electronic Signatures: Oh, Paul (MD)  (Signed 24-Jan-15 11:31)  Authored: Chief Complaint, VITAL SIGNS/ANCILLARY NOTES, Brief Assessment, Lab Results, Assessment/Plan   Last Updated: 24-Jan-15 11:31 by Oh, Paul (MD) 

## 2014-09-23 NOTE — Consult Note (Signed)
Chief Complaint:  Subjective/Chief Complaint patietn denies abdominal pain, nausea or vomiting.  no gross rectal bleeding or black stools.   VITAL SIGNS/ANCILLARY NOTES: **Vital Signs.:   28-Jan-15 10:12  Vital Signs Type Routine  Temperature Temperature (F) 97.5  Celsius 36.3  Temperature Source oral  Pulse Pulse 98  Respirations Respirations 17  Systolic BP Systolic BP 037  Diastolic BP (mmHg) Diastolic BP (mmHg) 61  Mean BP 77  Pulse Ox % Pulse Ox % 97  Pulse Ox Activity Level  At rest  Oxygen Delivery Room Air/ 21 %    13:00  Telemetry pattern Cardiac Rhythm Atrial fibrillation; PVC's; pattern reported by Telemetry Clerk; 95   Brief Assessment:  Cardiac Irregular   Respiratory clear BS   Gastrointestinal details normal Soft  Nontender  Bowel sounds normal  obese   Additional Physical Exam DRE-heme negative pale brown mucus   Lab Results: Routine Chem:  28-Jan-15 14:00   Phosphorus, Serum 4.7 (Result(s) reported on 29 Jun 2013 at 02:42PM.)  Routine Coag:  27-Jan-15 06:53   Prothrombin  18.8  INR 1.6 (INR reference interval applies to patients on anticoagulant therapy. A single INR therapeutic range for coumarins is not optimal for all indications; however, the suggested range for most indications is 2.0 - 3.0. Exceptions to the INR Reference Range may include: Prosthetic heart valves, acute myocardial infarction, prevention of myocardial infarction, and combinations of aspirin and anticoagulant. The need for a higher or lower target INR must be assessed individually. Reference: The Pharmacology and Management of the Vitamin K  antagonists: the seventh ACCP Conference on Antithrombotic and Thrombolytic Therapy. CWUGQ.9169 Sept:126 (3suppl): N9146842. A HCT value >55% may artifactually increase the PT.  In one study,  the increase was an average of 25%. Reference:  "Effect on Routine and Special Coagulation Testing Values of Citrate Anticoagulant Adjustment in  Patients with High HCT Values." American Journal of Clinical Pathology 2006;126:400-405.)  28-Jan-15 08:29   Prothrombin  18.3  INR 1.6 (INR reference interval applies to patients on anticoagulant therapy. A single INR therapeutic range for coumarins is not optimal for all indications; however, the suggested range for most indications is 2.0 - 3.0. Exceptions to the INR Reference Range may include: Prosthetic heart valves, acute myocardial infarction, prevention of myocardial infarction, and combinations of aspirin and anticoagulant. The need for a higher or lower target INR must be assessed individually. Reference: The Pharmacology and Management of the Vitamin K  antagonists: the seventh ACCP Conference on Antithrombotic and Thrombolytic Therapy. [id number].8828 Sept:126 (3suppl): [id number]. A HCT value >55% may artifactually increase the PT.  In one study,  the increase was an average of 25%. Reference:  "Effect on Routine and Special Coagulation Testing Values of Citrate Anticoagulant Adjustment in Patients with High HCT Values." American Journal of Clinical Pathology 0034;917:915-056.)  Routine Hem:  20-Jan-15 22:10   Hemoglobin (CBC)  6.4  21-Jan-15 06:25   Hemoglobin (CBC)  8.0    18:32   Hemoglobin (CBC)  6.0  22-Jan-15 05:33   Hemoglobin (CBC)  6.2    17:49   Hemoglobin (CBC)  7.0 (Result(s) reported on 23 Jun 2013 at 06:09PM.)  23-Jan-15 04:43   Hemoglobin (CBC)  6.8    20:18   Hemoglobin (CBC)  7.9 (Result(s) reported on 24 Jun 2013 at 08:38PM.)  24-Jan-15 05:49   Hemoglobin (CBC)  7.4  25-Jan-15 04:47   Hemoglobin (CBC)  7.9 (Result(s) reported on 26 Jun 2013 at 05:41AM.)  26-Jan-15 06:09   Hemoglobin (CBC)  7.6 (Result(s) reported on 27 Jun 2013 at 06:35AM.)  27-Jan-15 05:03   Hemoglobin (CBC)  8.6 (Result(s) reported on 28 Jun 2013 at 05:50AM.)    06:53   Hemoglobin (CBC)  8.4  28-Jan-15 06:03   WBC (CBC) 3.8  RBC (CBC)  3.20  Hemoglobin (CBC)  8.4   Hematocrit (CBC)  26.9  Platelet Count (CBC)  61  MCV 84  MCH 26.3  MCHC  31.3  RDW  18.7  Neutrophil % 56.5  Lymphocyte % 22.7  Monocyte % 17.9  Eosinophil % 2.3  Basophil % 0.6  Neutrophil # 2.1  Lymphocyte #  0.9  Monocyte # 0.7  Eosinophil # 0.1  Basophil # 0.0 (Result(s) reported on 29 Jun 2013 at 06:35AM.)   Assessment/Plan:  Assessment/Plan:  Assessment 1) anemia, intermittant black stool-hemodynamically stable.  heme negative to my exam today. no evidence of ongoing bleeding. 2) thrombocytopenia, coagulopathy.  patients relates a remote h/o etoh abuse but none for 15-20 years.  possibly consumptive, however, as noted heme negative at least on my check today.   Plan 1) per notes, patietn has reciever 5-6 unit prbc since hospitalization.  Only one is reported in SCM.  I have discussed with Dr Reuel Derby, and on review of blood bank, there have been 4 unit prbc given.  Patietn has been hemodynamically stable and no overt melena since admission.  I am uncertain as to the site of loss, however patient has been stable, in light of transfusiions and HD.   As discussed with Dr Manuella Ghazi, I am planning egd tomorrow, will need platelets available if under 70.  Patietn has been given vit K. Recheck labs in am. Will change diet back to clears until after scope tomorrow.   Electronic Signatures: Loistine Simas (MD)  (Signed 28-Jan-15 18:39)  Authored: Chief Complaint, VITAL SIGNS/ANCILLARY NOTES, Brief Assessment, Lab Results, Assessment/Plan   Last Updated: 28-Jan-15 18:39 by Loistine Simas (MD)

## 2014-09-23 NOTE — Consult Note (Signed)
Chief Complaint:  Subjective/Chief Complaint Please see full Gi consult and brief consult note.  Patient admitted after notification by he [facility] nephrologist of anemia on a lab check.  Patietn with history of lower extremity vascular stenting and starting plavix about a week ago, with subsequent development of several black stools.  last stool yesterday, brownish black, none today.  Denies n/v or abdominal pain.  Patietn also takes 162 mg asa daily as well but no other asa or nsaid.  Hemodynamically stable, on HD for [facility].  Will arrange for egd when clinically feasible.  Can proceed sooner if change of clinical situation.  Will discuss with Dr Delana Meyer.   Brief Assessment:  Cardiac Regular   Respiratory clear BS   Gastrointestinal details normal obese, non-tender, bs positive,   Electronic Signatures: Loistine Simas (MD)  (Signed 21-Jan-15 18:03)  Authored: Chief Complaint, Brief Assessment   Last Updated: 21-Jan-15 18:03 by Loistine Simas (MD)

## 2014-09-23 NOTE — Op Note (Signed)
PATIENT NAME:  Robert [name], Robert [name] MR#:  [id number] DATE OF BIRTH:  11/30/1940  DATE OF PROCEDURE:  06/28/2013  SURGEON: Durward Fortes, DPM   PREOPERATIVE DIAGNOSIS: Gangrene, right second and third toes.   POSTOPERATIVE DIAGNOSIS: Gangrene, right second and third toes.   PROCEDURE: Amputation right second and third toes.   ANESTHESIA: Local MAC.   HEMOSTASIS: None.   ESTIMATED BLOOD LOSS: Approximately 25 mL  PATHOLOGY: Right second and third toes.   DRAINS: None.   COMPLICATIONS: None apparent.   OPERATIVE INDICATIONS: This is a 74 year old male with a history of peripheral vascular disease. Recently had some ulcerative changes on the second and third toes. He did go for revascularization, but the toes continued to turn gangrenous. Recently admitted for a GI bleed, and decision was made to go ahead and amputate his gangrenous second and third toes.   OPERATIVE PROCEDURE: The patient was taken to the Operating Room and placed on the table in the supine position. Following satisfactory sedation, the right foot was anesthetized with 10 mL of 0.5% Sensorcaine plain around the central metatarsals. The right foot was then prepped and draped in the usual sterile fashion.    Attention was then directed to the distal aspect of the right foot, where an incision was made coursing from lateral to medial from the third interspace to the first interspace across the dorsal aspect of the foot at the base of the toes. This was carried sharply down to the level of bone. A similar incision was made plantarly from the first interspace to the third interspace. Dissection was carried along the tendon sheath separating the deep and superficial tissues from the bony structure, and the toes were then disarticulated at the metatarsophalangeal joints and removed in toto. There was noted to be good, healthy bleeding margins and tissue. The wound was flushed with copious amounts of sterile saline and closed using  4-0 nylon vertical mattress sutures x 3, followed by skin closure using 4-0 nylon simple interrupted sutures. Xeroform and sterile bandages applied to the right foot, followed by Kerlix and an Ace wrap. The patient tolerated the procedure and anesthesia well, and was transported to the PACU with vital signs stable and in good condition.    ____________________________ Sharlotte Alamo, DPM tc:mr D: 06/28/2013 17:28:51 ET T: 06/28/2013 19:02:23 ET JOB#: [id number]  cc: Sharlotte Alamo, DPM, <Dictator> Katha Cabal, MD  Anhar Mcdermott DPM ELECTRONICALLY SIGNED 07/26/2013 9:55

## 2014-09-23 NOTE — Consult Note (Signed)
Chief Complaint:  Subjective/Chief Complaint Stool brown now. Hgb stable. Told by nephrology that HD can be done after EGD.   VITAL SIGNS/ANCILLARY NOTES: **Vital Signs.:   25-Jan-15 06:00  Vital Signs Type Routine  Temperature Temperature (F) 97.5  Celsius 36.3  Temperature Source oral  Pulse Pulse 81  Respirations Respirations 18  Systolic BP Systolic BP 185  Diastolic BP (mmHg) Diastolic BP (mmHg) 64  Mean BP 80  Pulse Ox % Pulse Ox % 100  Pulse Ox Activity Level  At rest  Oxygen Delivery 2L   Brief Assessment:  GEN no acute distress   Cardiac Regular   Respiratory clear BS   Lab Results: Routine Hem:  25-Jan-15 04:47   Hemoglobin (CBC)  7.9 (Result(s) reported on 26 Jun 2013 at 05:41AM.)   Assessment/Plan:  Assessment/Plan:  Assessment Anemia. Melena. Hgb stable off plavix.   Plan NPO after MN. EGD tomorrow with Dr. Gustavo Lah. Thanks.   Electronic Signatures: Verdie Shire (MD)  (Signed 25-Jan-15 11:11)  Authored: Chief Complaint, VITAL SIGNS/ANCILLARY NOTES, Brief Assessment, Lab Results, Assessment/Plan   Last Updated: 25-Jan-15 11:11 by Verdie Shire (MD)

## 2014-09-23 NOTE — Consult Note (Signed)
   Comments   Came by to see patient but he is in dialysis. Will see tomorrow.   Electronic Signatures: Borders, Kirt Boys (NP)  (Signed 28-Dec-15 15:45)  Authored: Palliative Care   Last Updated: 28-Dec-15 15:45 by Irean Hong (NP)

## 2014-09-23 NOTE — Op Note (Signed)
PATIENT NAME:  UEL, DAVIDOW MR#:  [id number] DATE OF BIRTH:  1941-05-13  DATE OF PROCEDURE:  12/20/2013  PREOPERATIVE DIAGNOSES:  1.  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  2.  Complication of AV dialysis device.  3.  End-stage renal disease.   POSTOPERATIVE DIAGNOSES:  1.  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  2.  Complication of AV dialysis device.  3.  End-stage renal disease.   PROCEDURES PERFORMED: 1.  Abdominal aortogram.  2.  Right lower extremity distal runoff, 3rd order catheter placement.  3.  Percutaneous transluminal angioplasty to 5 mm using Lutonix balloon, common femoral and proximal SFA on the right.   SURGEON: Hortencia Pilar, M.D.   SEDATION: Versed 5 mg total plus 200 mcg fentanyl, administered IV. As noted in the previous note, sedation was for 2 hours.   CONTRAST USED: Isovue 85 mL.  TOTAL FLUOROSCOPY TIME: For both procedures was 10.6 minutes.   INDICATIONS: Ms. Savitz is a 74 year old gentleman with multiple medical problems who is undergoing co-joined procedures. First treatment of his AV graft for preservation of his dialysis access and then he is undergoing angiography with right lower extremity distal runoff. Risks and benefits have been reviewed. All questions answered. The patient agrees to proceed.   DESCRIPTION OF PROCEDURE: The patient is taken to special procedures and his AV access is performed as is dictated and documented in the prior note. Attention is then turned to the groins which are prepped and draped in a sterile fashion. Timeout is again called.   Working through very dense scar tissue on the left there is significant difficulty just accessing the artery. Ultimately, a stiff micropuncture kit was not able to track over the wire and a Seldinger needle was inserted directly into the artery and an Amplatz Super Stiff wire advanced under  fluoroscopy. The artery was visualized with ultrasound continuously during the punctures. Image has been recorded for the permanent record. It is pulsatile and echolucent indicating patency.   First a 5-French sheath and then a 6-French sheath is inserted. Pigtail catheter is advanced to T12 and AP projection of the aorta is obtained. Pigtail catheter is repositioned to above the bifurcation and LAO projection of the pelvis is obtained. Using a rim catheter and stiff-angled Glidewire, the bifurcation is crossed and the catheter is advanced down to the common femoral. Exchange of the rim for a pigtail catheter is then performed and a RAO projection of the groin is obtained. The wire is then negotiated into the SFA and subsequently the catheter is advanced and the distal runoff is obtained. A high-grade lesion is noted right at the proximal SFA extending into the common femoral and this is consistent with the ultrasound imaging.   Magic torque wire is then exchanged and initially a 4 x 100 Rival balloon is advanced across the lesion and angioplasty is performed. This is to ensure that the Lutonix will cross as there is a time limit for the Lutonix balloon once it has come in contact with blood flow. A 5 x 100 Lutonix is then used to cross the lesion. It is inflated to 14 atmospheres and left inflated for 3 full minutes. Follow-up imaging demonstrates significant improvement in the lesions with a much more rapid flow of contrast. Distal runoff is preserved.   The sheath is then pulled into the external on the left and oblique view is obtained after exchanging for an 11 cm 6-French sheath. It should be  noted that once the wire was up and over the bifurcation, a 6-French Ansell high flex sheath was advanced up and over the bifurcation and positioned with the tip at the common femoral proximally. With the oblique view done, a Mynx device is deployed and there are no immediate complications.   INTERPRETATION: The  abdominal aorta demonstrates mild aneurysmal changes. There are diffuse calcifications throughout the entire arterial system. It is quite easy to see the entire arterial system with plain fluoroscopy. There is such dense calcifications. These are especially prominent in both common femorals. The SFA continues to be densely calcified with multiple areas of narrowing. However, with the exception of the proximal area, in the common femoral, there does not appear to be any focal flow-limiting disease. Popliteal is relatively spared. Trifurcation demonstrates occlusion of the anterior tibial and peroneal. The posterior tibial is patent down to the foot, although heavily diseased.   Following angioplasty of the common femoral and proximal SFA, there is significant improvement, both in the flow as well as the appearance of the artery.   SUMMARY: Successful angioplasty of the proximal SFA and common femoral with a 5 mm Lutonix, as noted above.   ____________________________ Katha Cabal, MD ggs:sb D: 12/20/2013 10:35:18 ET T: 12/20/2013 11:36:26 ET JOB#: [id number]  cc: Katha Cabal, MD, <Dictator> Munsoor Lilian Kapur, MD Isla Pence, MD  Katha Cabal MD ELECTRONICALLY SIGNED 12/27/2013 12:23

## 2014-09-23 NOTE — Discharge Summary (Signed)
PATIENT NAME:  Robert [name], Robert [name] MR#:  [id number] DATE OF BIRTH:  1941/03/26  DATE OF ADMISSION:  06/22/2013 DATE OF DISCHARGE:  07/01/2013  DISCHARGE DIAGNOSES: 1.  Acute on chronic symptomatic anemia with a baseline hemoglobin around 7.5, 8.4 and on discharge his hemoglobin was 8.4, status post 4 to 5 units of blood transfusion.  No clear etiology identified, was not a candidate for endoscopy so was unable to specify whether or not this is gastrointestinal in nature or not.  2.  Hypotension, likely hypovolemic/hemorrhagic shock, now resolved, unsure etiology.  3.  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  4.  Possible gastrointestinal bleed, but hemoglobin negative so unable to confirm without endoscopy, now he is hemodynamically stable.  Possible underlying blood disorder which is being worked up as an outpatient by Dr. Inez Pilgrim.   SECONDARY DIAGNOSES: 1.  Hypertension.  2.  Hyperlipidemia.  3.  Coronary artery disease, status post coronary artery bypass graft.  4.  Peripheral vascular disease.  5.  Ischemic cardiomyopathy.   6.  End-stage renal disease on hemodialysis.  7.  Chronic obstructive pulmonary disease on 2 liters oxygen via nasal cannula at baseline.  8.  Diabetes.  9.  History of renal cell carcinoma.  10.  History of transitional cell carcinoma of the bladder.  11.  History of atrial fibrillation.   CONSULTATIONS:  1.  GI, Dr. Gustavo Lah.  2.  Oncology, Dr. Inez Pilgrim.  3.  Nephrology, Dr. Holley Raring.  4.  Podiatry, Dr. Elvina Mattes.   PROCEDURES AND RADIOLOGY:   1.  Amputation of the right second and third toe by Dr. Cleda Mccreedy on 27th of January.  2.  Chest x-ray on 21st of January showed no acute cardiopulmonary disease.  Cardiomegaly present.   MAJOR LABORATORY PANEL:  Blood cultures x 2 were negative on 21st of January.  Wound culture was growing Klebsiella oxytoca and moderate growth of Enterobacter cloacae.  Light growth of enterococcus  faecalis.   Intact PTH was elevated with a value of 321.   Stool for C. difficile was negative.   Haptoglobin was normal with a value of 194.   HISTORY AND SHORT HOSPITAL COURSE:  The patient is a 74 year old male with the above-mentioned medical problems who was admitted for symptomatic anemia with a reported hemoglobin of 4 with a concern for possible GI bleed.  Please see Dr. Samantha Crimes dictated history and physical for further details.  On repeat check here in the Emergency Department his hemoglobin was 6.4, was ordered 1 unit of blood and subsequently required 3 or 4 more units of blood without exact source of bleeder or source of blood loss.  GI consultation was obtained with Dr. Gustavo Lah who recommended stopping Plavix and possibly getting endoscopy done.  He wanted to wait for at least six days before he could perform endoscopy considering Plavix on board.  The patient was also evaluated by nephrology Dr. Holley Raring for dialysis need.  Vascular surgery consultation was obtained with Dr. Delana Meyer who recommended stopping the Plavix.  Podiatry consultation was obtained with Dr. Elvina Mattes considering gangrene of the right second and third toe and he recommended amputation which was performed on 27th of January without any major complication.  During his long stay, the patient was essentially waiting to get an endoscopy, but during those six days the patient's platelet count started dropping and he also required about another 3 to 4 units of packed red blood cell transfusion.  Due to his low platelets  and elevated PT, INR with INR of up to 1.6 with Hemoccult negative, GI did not want to scope him considering very low yield.  Oncology consultation was obtained with Dr. Inez Pilgrim who recommended outpatient work-up for possible myelodysplastic syndrome and other blood disorder considering low platelets and low blood count.  The patient was clinically doing much better and was discharged home on 30th of January in stable  condition.   PHYSICAL EXAMINATION: VITAL SIGNS:  On the date of discharge, his vital signs were as follows:  Temperature 97.4, heart rate 97 per minute, respirations 18 per minute, blood pressure 128/56 mmHg.  He was saturating 97% on room air.  Pertinent physical examination on the date of discharge:  CARDIOVASCULAR:  S1, S2 normal.  No murmurs, rubs or gallop.  LUNGS:  Clear to auscultation bilaterally.  No wheezes, rales, rhonchi or crepitation.  ABDOMEN:  Soft, benign.  NEUROLOGIC:  Nonfocal examination.  All other physical examination remained at baseline.   DISCHARGE MEDICATIONS: 1.  Midodrine 5 mg by mouth every four hours as needed.  2.  Calcium acetate 667 mg 3 tablets by mouth 3 times a day, 2 tablets with breakfast, 3 with lunch and 1 with snacks.  3.  Rena-Vite once daily.  4.  Omeprazole 40 mg by mouth daily.  5.  Probiotic 4 capsules by mouth daily.  6.  Vitamin B complex 200 mg by mouth daily.  7.  Coenzyme Q 2 capsules by mouth daily.  8.  Vitamin D3 2000 international units once daily.  9.  Aspirin 81 mg by mouth twice daily.  10.  Insulin NovoLog FlexPen sliding scale for blood sugar over 123 as prescribed.  11.  2 liters oxygen via nasal cannula continuous at bedtime as he has been using at home.  12.  Carisoprodol 350 mg by mouth at bedtime.  13.  Levoxyl 175 mcg by mouth daily.  14.  Proventil 2 puffs inhaled every six hours as needed.  15.  Finasteride 5 mg by mouth daily.  16.  Nitrostat 0.3 mg sublingual every five minutes as needed.  Ambler 1 capsule by mouth daily.  18.  Oxycodone 5 mg by mouth 3 times a day as needed.  19.  Oxycodone 10 mg by mouth at bedtime.  20.  Requip  5 mg by mouth 4 times a day.   DISCHARGE DIET:  Low sodium, 1800 ADA.   DISCHARGE ACTIVITY:  As tolerated.   DISCHARGE INSTRUCTIONS AND FOLLOW-UP:  The patient was instructed to follow up with his primary care physician, Dr. Rosario Jacks in 2 to 4 weeks.  He will  need follow-up with his pain management doctor, Dr. Humphrey Rolls, in 1 to 2 weeks.  He will also need follow-up with [facility], Dr. Gustavo Lah in 4 to [redacted] weeks along with Dr. Inez Pilgrim on coming Thursday on February 5th as scheduled.  He will need follow-up with Dr. Cleda Mccreedy or Troxler with podiatry in 2 to 3 weeks.  He will get hemodialysis as scheduled on Monday, Wednesday, Friday.   Total time discharging this patient was 55 minutes.    ____________________________ Lucina Mellow. Manuella Ghazi, MD vss:ea D: 07/02/2013 11:32:37 ET T: 07/02/2013 17:31:45 ET JOB#: [id number]  cc: Lunabella Badgett S. Manuella Ghazi, MD, <Dictator> Isla Pence, MD Dr. Burt Ek. Troxler, DPM Sharlotte Alamo, DPM Simonne Come. Inez Pilgrim, MD Lollie Sails, MD Lucina Mellow [facility] MD ELECTRONICALLY SIGNED 07/03/2013 3:41

## 2014-09-23 NOTE — Consult Note (Signed)
Chief Complaint:  Subjective/Chief Complaint seen for anemia, occasional black stool.  patient tolerated regular diet yesterday, denies abdominalpain , no nausea .  no bm since yesterday.   VITAL SIGNS/ANCILLARY NOTES: **Vital Signs.:   29-Jan-15 05:06  Vital Signs Type Routine  Temperature Temperature (F) 98.5  Temperature Source oral  Pulse Pulse 103  Respirations Respirations 18  Systolic BP Systolic BP 98  Diastolic BP (mmHg) Diastolic BP (mmHg) 55  Mean BP 69  Pulse Ox % Pulse Ox % 97  Pulse Ox Activity Level  At rest  Oxygen Delivery 2L   Brief Assessment:  Cardiac Irregular   Respiratory normal resp effort   Gastrointestinal details normal Soft  Nondistended  Bowel sounds normal  obese/protuberant   Lab Results: Routine BB:  29-Jan-15 07:23   Platelets (Blood Component) Ready (Result(s) reported on 30 Jun 2013 at 08:46AM.)  Routine Chem:  29-Jan-15 05:22   Glucose, Serum 91  BUN 16  Creatinine (comp)  6.31  Sodium, Serum  135  Potassium, Serum 3.8  Chloride, Serum 98  CO2, Serum  35  Calcium (Total), Serum 9.1  Anion Gap  2  Osmolality (calc) 271  eGFR (African American)  9  eGFR (Non-African American)  8 (eGFR values <96mL/min/1.73 m2 may be an indication of chronic kidney disease (CKD). Calculated eGFR is useful in patients with stable renal function. The eGFR calculation will not be reliable in acutely ill patients when serum creatinine is changing rapidly. It is not useful in  patients on dialysis. The eGFR calculation may not be applicable to patients at the low and high extremes of body sizes, pregnant women, and vegetarians.)  Routine Coag:  29-Jan-15 05:22   Prothrombin  19.1  INR 1.6 (INR reference interval applies to patients on anticoagulant therapy. A single INR therapeutic range for coumarins is not optimal for all indications; however, the suggested range for most indications is 2.0 - 3.0. Exceptions to the INR Reference Range may  include: Prosthetic heart valves, acute myocardial infarction, prevention of myocardial infarction, and combinations of aspirin and anticoagulant. The need for a higher or lower target INR must be assessed individually. Reference: The Pharmacology and Management of the Vitamin K  antagonists: the seventh ACCP Conference on Antithrombotic and Thrombolytic Therapy. UYQIH.4742 Sept:126 (3suppl): [id number]. A HCT value >55% may artifactually increase the PT.  In one study,  the increase was an average of 25%. Reference:  "Effect on Routine and Special Coagulation Testing Values of Citrate Anticoagulant Adjustment in Patients with High HCT Values." American Journal of Clinical Pathology 2006;126:400-405.)  Routine Hem:  29-Jan-15 05:22   WBC (CBC) 4.2  RBC (CBC)  3.19  Hemoglobin (CBC)  8.4  Hematocrit (CBC)  26.8  Platelet Count (CBC)  56  MCV 84  MCH 26.3  MCHC  31.3  RDW  19.2  Neutrophil % 57.1  Lymphocyte % 22.3  Monocyte % 18.9  Eosinophil % 1.3  Basophil % 0.4  Neutrophil # 2.4  Lymphocyte #  0.9  Monocyte # 0.8  Eosinophil # 0.1  Basophil # 0.0 (Result(s) reported on 30 Jun 2013 at 06:10AM.)   Assessment/Plan:  Assessment/Plan:  Assessment 1) anemia, gi blood loss, occasional black stool. , not recurrent.  anemia is likely multifactorial, in the setting of history of some myelodysplasia.   He likely has had some amount of GI loss, however with findinf currently of heme negative and continued platelet decline and some transfusion requirement,  there is the question of further or increasing  problems with bone marrow.  Case discussed with Dr Cynda Acres and Dr Manuella Ghazi as well as prolonged discussion with patient and his wife.  Dr Cynda Acres to see patient today.  Will hold egd for now, release platelets, pending further hematology evaluation.  Recommend changing protonix to bid po. Following.   Electronic Signatures: Loistine Simas (MD)  (Signed 29-Jan-15 11:06)  Authored: Chief  Complaint, VITAL SIGNS/ANCILLARY NOTES, Brief Assessment, Lab Results, Assessment/Plan   Last Updated: 29-Jan-15 11:06 by Loistine Simas (MD)

## 2014-09-23 NOTE — Consult Note (Signed)
PATIENT NAME:  Robert [name], Robert [name] MR#:  [id number] DATE OF BIRTH:  March 04, 1941  DATE OF CONSULTATION:  06/22/2013  REFERRING PHYSICIAN:  Dr. Lavetta Nielsen.  CONSULTING PROVIDER:  Theodore Demark, NP  REASON FOR CONSULTATION: To evaluate for GI bleed.   HISTORY OF PRESENT ILLNESS: I appreciate consult for this 74 year old Caucasian man with a history of PVD status post right femoral stenting/necrotic right toes, CABG, atrial fibrillation, ESRD on HD MWS, COPD, history of renal cell carcinoma status post cryotherapy, diabetes, ischemic cardiomyopathy for evaluation of melena.   The patient was started on Plavix last week due to PVD requirement. States that he noted black stools over the last 2 to 3 days. The last was yesterday. Denies abdominal pain, dyspepsia, and all other GI complaints. States he has had colonoscopy but never had EGD. Evidently, hemoglobin was 4 outside of this hospital, 6.4 on ED on arrival, was up to 8 today after transfusion last night. He is currently receiving dialysis   PAST MEDICAL HISTORY: Hypertension; hyperlipidemia; CAD status post CABG, PVD, status post right femoral stenting; ischemic cardiomyopathy; ESRD on HD; COPD with 2 liters [place];ischemic cardiomyopathy ; diabetes; renal cell carcinoma status post cryotherapy; atrial fibrillation; hyperparathyroidism; pacemaker placement; anemia of chronic disease.   HOME MEDICATIONS: ASA 81 mg p.o. daily, calcium acetate 667 mg three tabs t.i.d.,   soma 350 mg nightly, co-Q10 two caps q.a.m., Colterol 1 cap daily, finasteride 5 mg daily, Levoxyl 175 mcg p.o. daily, Midodrine 5 mg q.4 h. p.r.n., Nitrostat 0.3 sublingual p.r.n., NovoLog sliding scale insulin, 02 at 2, omeprazole 40 mg one p.o. daily, oxycodone 10 mg p.o. at bedtime, oxycodone 5 mg p.o. t.i.d. p.r.n., Proventil 2 puffs every 6 hours p.r.n., Rena-Vite 1 tab q.a.m., Requip 5 mg p.o. t.i.d., vitamin B complex daily, vitamin D3 at 2000 units p.o. daily.   SOCIAL HISTORY:  Lives with spouse. Remote tobacco abuse. No current alcohol or illicits.   FAMILY HISTORY: No known history of colon cancer, rectal cancer, liver disease, ulcers, colon polyps.   REVIEW OF SYSTEMS: Ten systems reviewed. Significant for DOE, fatigue, and generalized weakness, otherwise unremarkable.   LABORATORY DATA: Most recent: Glucose 144, iron 26, BUN 52, creatinine 6.6, calcium 9.7, potassium 4.8, sodium 138, phosphorus 5.1. Troponin 0.03. WBC 3.8, hemoglobin 8 , hematocrit 26.2, platelets 95. Red cells are microcytic.   Chest x-ray significant for cardiomegaly.   Fe sat 9, TIBC 292, ferritin 38.   PHYSICAL EXAMINATION:  VITAL SIGNS: Most recent: Temp 98.2, pulse 71, respiratory rate 20, blood pressure 97/49, SaO2 99%. He has been in atrial fibrillation on a monitor.  GENERAL: Comfortable-appearing Caucasian man resting in bed, having dialysis, in no acute distress.  HEENT: Normocephalic. Conjunctivae somewhat pale. Sclerae clear.  NECK: Supple. No thyromegaly, lymphadenopathy, JVD.  CARDIAC: S1, S2, somewhat irregular. No significant edema. No gallop. No murmur.  LUNGS: Respirations eupneic. Lungs CTAB.  ABDOMEN: Slightly rounded abdomen, soft. Bowel sounds x4, nondistended, nontender. No rigidity, guarding, peritoneal signs, hepatosplenomegaly or other abnormalities.  SKIN: Warm, dry, pale, pink. No erythema, lesion or rash.  EXTREMITIES: MAEW x4. Sensation appears intact. Mild generalized weakness.  NEUROLOGICAL: Alert, oriented x3. Cranial nerves II through XII intact.  PSYCHIATRIC: Mood stable, pleasant, calm.   IMPRESSION AND PLAN: Melena concerning for upper gastrointestinal bleed. Agree with holding anticoagulation for now. Agree with b.i.d. IV proton pump inhibitor. Will likely need esophagogastroduodenoscopy when clinically feasible. Will continue to follow hemoglobin and transfuse p.r.n.   Thank you very much for this  consult.   These services were provided by Stephens November, MSN, [facility], in collaboration with Loistine Simas, M.D. with whom I discussed this patient in full. Thank you very much for this consult    ____________________________ Theodore Demark, NP chl:np D: 06/22/2013 16:48:26 ET T: 06/22/2013 17:15:42 ET JOB#: [id number]  cc: Theodore Demark, NP, <Dictator> [place] SIGNED 08/03/2013 8:14

## 2014-09-23 NOTE — Op Note (Signed)
PATIENT NAME:  Robert [name], Robert [name] MR#:  [id number] DATE OF BIRTH:  1940-12-06  DATE OF PROCEDURE:  11/01/2013  SURGEON: Durward Fortes, DPM   PREOPERATIVE DIAGNOSIS: Chronic ulceration with osteomyelitis, right forefoot.   POSTOPERATIVE DIAGNOSIS: Chronic ulceration with osteomyelitis, right forefoot.   PROCEDURE: Excisional debridement, soft tissue and bone, right forefoot 2nd and 3rd metatarsal heads.   POSTOPERATIVE PROCEDURE: Excisional debridement, soft tissue and bone, right forefoot 2nd and 3rd metatarsal heads.   ANESTHESIA: Local MAC.   HEMOSTASIS: None.   ESTIMATED BLOOD LOSS: 50 mL.   PATHOLOGY: 2nd and 3rd metatarsal heads, right foot.   DRAINS: None.   COMPLICATIONS: None apparent.   MATERIALS: Rapid cure vancomycin-impregnated beads.   OPERATIVE INDICATIONS: This is a 74 year old male with a chronic nonhealing ulceration on his right forefoot status post amputation of the 2nd and 3rd toes. Decision was made for re- debridement of the nonhealing soft tissues as well as the underlying bone.   OPERATIVE PROCEDURE: The patient was taken to the operating room and placed on the table in the supine position. Following satisfactory sedation, the right foot was anesthetized with 10 mL of 0.5% Sensorcaine plain around the central metatarsals on the right. The foot was then prepped and draped in the usual sterile fashion. Attention was then directed to the distal aspect of the right foot where a chronic ulceration was noted at the amputation site of the 2nd and 3rd toes. Excisional debridement was performed with a VersaJet debrider on a setting of 6 of the chronic fibrotic soft tissues. This was accomplished to full thickness down to the level of deep fascia and bone. Good healthy bleeding tissues were noted upon debridement. Next, excisional debridement of the heads of the 2nd and 3rd metatarsal heads was performed using a pneumatic saw. The heads were removed in toto and good  healthy bleeding tissues were noted. Bone quality was in good shape. The wound was then flushed with copious amounts of sterile saline using the VersaJet debrider on a setting of 2 as well as regular irrigation. There was noted to be significant bleeding from the wound at this point. Surgiflo was then injected into the wound for coagulation. Following Surgiflo application, vancomycin, rapid cure beads were then implanted into the remaining portion of the wound. The dorsal incision was closed using 4-0 nylon simple interrupted sutures. The remaining Surgiflo was then applied over the antibiotic beads. Xeroform and a sterile bandage were applied followed by fluffs, ABDs, Kerlix, and an Ace wrap. The patient tolerated the procedure and anesthesia well and was transported to the PACU with vital signs stable and in good condition.    ____________________________ Sharlotte Alamo, DPM tc:dd D: 11/01/2013 14:51:52 ET T: 11/01/2013 19:07:06 ET JOB#: [id number]  cc: Sharlotte Alamo, DPM, <Dictator> Tegh Franek DPM ELECTRONICALLY SIGNED 11/25/2013 13:10

## 2014-09-23 NOTE — Consult Note (Signed)
Admit Diagnosis:   NSTEMI.: Onset Date: 01-Jun-2014, Status: Active, Description: NSTEMI.      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): Onset Date: 30-Jun-2012, Status: Active, Coding System: ICD9, Coded Name: Atherosclerosis of native arteries of the extremities with u    Swallowing difficulty:    Atrial Fibrillation:    Cardiomyopathy:    Back Pain, Chronic:    Dialysis:    02 @2L  HS:    COPD:    Hard of Hearing:    Diabetes:    Parkinson's Disease:    Renal Insufficiency:    Enlarged Prostate:    Cancer, Bladder:    Anemia:    Hypercholesterolemia:    Hypertension:    Amputation left 2nd toe: Jun 2014   AV graft -- L arm:    Cervical spine repair: Cadaver bones and plates placed   Carotid Endarterectomy:    Thyroid eradication d/t Graves Disease:    nasal surgery:    stent in left leg:    bladder surgery to remove cancer:    Pacemaker:    Cholecystectomy:    CABG (Coronary Artery Bypass Graft):   Home Medications: Medication Instructions Status  O2 @ 2 Liters [place] once a day (at bedtime) Active  finasteride 5 mg oral tablet 1 tab(s) orally once a week (on Wednesday) Active  gabapentin 100 mg oral capsule 1 cap(s) orally 3 times a day Active  ProAir HFA CFC free 90 mcg/inh inhalation aerosol 2 puff(s) inhaled 4 times a day, As Needed - for Wheezing, for Shortness of Breath  Active  Nitrostat 0.4 mg sublingual tablet 1 tab(s) sublingual every 5 minutes, As Needed - for Chest Pain Active  carbidopa-levodopa 25 mg-100 mg oral tablet, extended release 0.5 tab(s) orally 2 times a day Active  levothyroxine 175 mcg (0.175 mg) oral tablet 1 tab(s) orally once a day (in the morning) Active  midodrine 10 mg oral tablet 1 tab(s) orally 2 times a day Active  MiraLax - oral powder for reconstitution 17 gram(s) orally 4 times a week (on Tuesday, Thursday, Saturday, and Sunday) Active  oxyCODONE 5 mg oral tablet 1-2 tab(s)  orally every 6 hours, As Needed - for Pain Active  [facility] Colon Health - oral capsule 1 cap(s) orally once a day (in the morning) Active  Rena-Vite Vitamin B Complex with C and Folic Acid oral tablet 1 tab(s) orally once a day (in the morning), 2 at lunch, and 2 at dinner.  Active  rOPINIRole 0.5 mg oral tablet 1 tab(s) orally 4 times a day Active   Lab Results: Routine Micro:  28-Dec-15 01:35   Micro Text Report BLOOD CULTURE   ORGANISM 1                STREPTOCOCCUS VIRIDANS   COMMENT                   IN AEROBIC AND ANAEROBIC BOTTLES   COMMENT                   REPEATING TO CONFIRM ID   GRAM STAIN                GRAM POSITIVE COCCI   ANTIBIOTIC             ORG#1     CEFTRIAXONE                   S/0.047   ERYTHROMYCIN  S/0.023   LEVOFLOXACIN                  S/1.5     VANCOMYCIN                    S/1.0     PENICILLIN MIC                I/0.19    13:50   Micro Text Report BLOOD CULTURE   COMMENT                   IN AEROBIC AND ANAEROBIC BOTTLES   COMMENT                   ID TO FOLLOW   COMMENT                   COLONIES TOO SMALL TO READ   GRAM STAIN                GRAM POSITIVE COCCI IN PAIRS IN CHAINS   ANTIBIOTIC                       Routine Hem:  29-Dec-15 04:36   WBC (CBC)  13.4  30-Dec-15 03:12   WBC (CBC)  11.9  31-Dec-15 03:20   WBC (CBC) 7.4    Morphine: Itching, Hives, Other  Cipro: Itching  Contrast - Iodinated Radiocontrast Dye: Other  Dilaudid: Alt Ment Status  Celebrex: Itching  Niacin: Hives, Itching  Mevacor: Other   General Aspect Pt seen in ICU.  Positive blood cultures.  Concern for right foot toe amputation site as source.  Is s/p right 2nd toe amputation early 2015.  Seen by vascular surgery recently.   Case History and Physical Exam:  Cardiovascular Non palpable pulses.   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   Skin Superficial granular wound at right 2nd toe amputation site.   No signs of infection either superficial or deep.  No cellulitis surrounding.    Impression Foot ulceration site looks clean and not infected.  Removed superficial debris and wound bed is nice and granular.  Not likely source of infection.  Recommend continued local wound care. F/U with Podiatry outpt.   Electronic Signatures: Samara Deist (MD)  (Signed 31-Dec-15 16:07)  Authored: Health Issues, Significant Events - History, Home Medications, Labs, Allergies, General Aspect/Present Illness, History and Physical Exam, Impression/Plan   Last Updated: 31-Dec-15 16:07 by Samara Deist (MD)

## 2014-09-23 NOTE — Consult Note (Signed)
Brief Consult Note: Diagnosis: gangrene to right 2 and 3 toes.   Patient was seen by consultant.   Consult note dictated.   Comments: Toes are gangrenous, but I am not impressed with any significant infection emanating from toes at this pointl  I would recommend getting his GI bleed under control first and then later this week we can sovle the gangrene to his toes.  Electronic Signatures: Perry Mount (MD)  (Signed 24-Jan-15 18:12)  Authored: Brief Consult Note   Last Updated: 24-Jan-15 18:12 by Perry Mount (MD)

## 2014-09-23 NOTE — Consult Note (Signed)
PATIENT NAME:  Robert [name], Robert [name] MR#:  [id number] DATE OF BIRTH:  09-20-1940  DATE OF CONSULTATION:  06/25/2013  CONSULTING PHYSICIAN:  Gerrit Heck. Montez Cuda, DPM  This is a 74 year old male admitted for GI bleed. He has been on some Plavix lately and started having a GI bleed and was admitted I think either yesterday or today. He is also a patient of Dr. Sharlotte Alamo, my partner's patient, and he has had some problems with gangrenous changes to the second and third toes of his right foot. He was planned to see Dr. Cleda Mccreedy on Monday to plan an amputation timeframe. He did undergo an angioplasty on his right foot a couple of weeks ago, which I think has stabilized it and provided some better blood flow to his lower extremity.   PAST MEDICAL HISTORY: Includes hypertension, hyperlipidemia, CAD, status post CABG, PVD, cardiomyopathy, end-stage renal disease. He is on dialysis at this point. He has chronic COPD, history of diabetes, history of renal cell carcinoma, history of transitional cell carcinoma of the bladder, as well as a history of atrial fibrillation.   SOCIAL HISTORY: Denies EtOH or drug usage. Has a remote history of tobacco usage.   FAMILY HISTORY: Positive for coronary artery disease.   ALLERGIES: INCLUDE CELEBREX, CIPRO, CONTRAST DYE, DILAUDID, MEVACOR, MORPHINE, AND NIACIN.  CURRENT MEDICATIONS: At home include finasteride 5 mg daily, aspirin 81 mg daily, oxycodone 10 mg at bedtime and 5 mg up to 3 times a day p.r.n. pain, Nitrostat 0.3 mg sublingual tablet, NovoLog sliding scale, Requip 5 mg 4 times daily, Proventil 90 mcg 2 puffs every 6 hours, midodrine 5 mg p.o. every 4 hours, carisoprodol 350 mg by mouth at bedtime, calcium supplement, Prilosec 40 mg daily, Levoxyl 170 mcg daily, oxygen 2 liters nasal cannula. Also takes multiple vitamins.   PHYSICAL EXAMINATION:  GENERAL: He is an alert, pleasant, well-oriented 74 year old male. Moderate obesity.  HEENT: Within normal limits.   LOWER EXTREMITY: Vascular: (Dictation Anomaly) <DP and PT<MISSING TEXT>> pulses are difficult to palpate. There is a little edema to the right foot, but the color and temperature is not too bad to most of the digits, except for the second and third toes of the right foot which do have some gangrenous changes, but there is no spreading cellulitis or redness at the base of those toes or on the dorsum of the foot. On the left foot, he has had an amputation of the left second toe by Dr. Cleda Mccreedy in the past.   The main problem he has right now is that GI bleed, and he is scheduled to have an upper endoscopy either Sunday or Monday, I think it is Monday, to check this out.   Labs at this time showed a hemoglobin of 7.4, hematocrit 24.4 and a platelet count of 97. All these are significantly low. His white count is 4.4 with no left shift at this point.   CLINICAL IMPRESSION: Gangrenous changes, second and third toes right foot, appear to be relatively stable at this point from an infectious standpoint.   TREATMENT PLAN: I am going to speak with Dr. Cleda Mccreedy about this. I do not think there is a rush on taking the toes off. I think he probably would be better served having the upper endoscopies done first and finding out possibly the reason for his GI bleed, and also to give his hemoglobin a little chance to recover some before he has the toes amputated, especially with the platelet count like it is.  I do not see an overt sign of infection to his foot at this point and therefore, I think he is stable. Maybe later in the week we could consider doing the surgery on his toes to try to resolve the gangrenous issues. He is in agreement with this. I will speak with Dr. Cleda Mccreedy and Dr. Clayton Bibles about this tomorrow.   ____________________________ Gerrit Heck Ediel Unangst, DPM mgt:jcm D: 06/25/2013 18:09:43 ET T: 06/25/2013 18:49:57 ET JOB#: [id number]  cc: Gerrit Heck Joci Dress, DPM, <Dictator> Perry Mount MD ELECTRONICALLY SIGNED  07/21/2013 13:32

## 2014-09-23 NOTE — Consult Note (Signed)
Chief Complaint:  Subjective/Chief Complaint main complaint today is loose stools for 2 days. denies black stools or abd pain, no v/n. Had dialysis early this am, unalbe to follow that with sedated egd, egd changed to tomorrow am.   VITAL SIGNS/ANCILLARY NOTES: **Vital Signs.:   26-Jan-15 13:41  Vital Signs Type Post-Procedure  Temperature Temperature (F) 97.4  Celsius 36.3  Pulse Pulse 92  Respirations Respirations 18  Systolic BP Systolic BP 553  Diastolic BP (mmHg) Diastolic BP (mmHg) 43  Mean BP 64  Pulse Ox % Pulse Ox % 96  Oxygen Delivery Room Air/ 21 %   Brief Assessment:  Cardiac Regular   Respiratory clear BS   Gastrointestinal details normal Soft  Nontender  Nondistended  Bowel sounds normal  obese/protuberant   Lab Results: Hepatic:  26-Jan-15 09:35   Albumin, Serum  2.8  Routine Chem:  26-Jan-15 09:35   Glucose, Serum 80  BUN  35  Creatinine (comp)  8.29  Sodium, Serum  135  Potassium, Serum 4.1  Chloride, Serum 98  CO2, Serum 29  Calcium (Total), Serum 8.9  Phosphorus, Serum  6.4  Phosphorus, Serum  6.7 (Result(s) reported on 27 Jun 2013 at 11:12AM.)  Anion Gap 8  Osmolality (calc) 277  eGFR (African American)  7  eGFR (Non-African American)  6 (eGFR values <22mL/min/1.73 m2 may be an indication of chronic kidney disease (CKD). Calculated eGFR is useful in patients with stable renal function. The eGFR calculation will not be reliable in acutely ill patients when serum creatinine is changing rapidly. It is not useful in  patients on dialysis. The eGFR calculation may not be applicable to patients at the low and high extremes of body sizes, pregnant women, and vegetarians.)  Routine Hem:  26-Jan-15 06:09   Hemoglobin (CBC)  7.6 (Result(s) reported on 27 Jun 2013 at 06:35AM.)   Assessment/Plan:  Assessment/Plan:  Assessment 1) anemia, intermittant melena in the setting of recern iliac stent and starting clopidogrel.  No off anticoagulants.  2)  diarrhea   Plan 1) egd tomorrow.  I have discussed the risks benefits and complicatiosn of egd to include not limited to bleeding infection perfopration and seedation and he wishes to proceed.  2) diarrhea-check C diff.   Electronic Signatures: Loistine Simas (MD)  (Signed 26-Jan-15 18:55)  Authored: Chief Complaint, VITAL SIGNS/ANCILLARY NOTES, Brief Assessment, Lab Results, Assessment/Plan   Last Updated: 26-Jan-15 18:55 by Loistine Simas (MD)

## 2014-09-24 NOTE — Op Note (Signed)
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   PROCEDURES PERFORMED:  1. Abdominal aortogram.  2. Left lower extremity distal runoff, third order catheter placement.  3. Percutaneous transluminal angioplasty of the left superficial femoral artery and common femoral artery.   SURGEON: Hortencia Pilar, MD   SEDATION: Versed 5 mg plus fentanyl 200 mcg administered IV. Continuous ECG, pulse oximetry and cardiopulmonary monitoring is performed throughout the entire procedure by the Interventional Radiology nurse. Total sedation time was one hour, 40 minutes.   ACCESS: A 6-French sheath, right common femoral artery.   CONTRAST USED: Isovue 85 mL.   FLUOROSCOPY TIME: 14.9 minutes.   INDICATIONS: Robert [name] is 74 year old gentleman with a history of atherosclerotic occlusive disease. He has undergone stent placement in the superficial femoral artery in the past. He has done well for several years but now returns with worsening of his leg pain. The risks and benefits for reintervention were reviewed. All questions were answered. The patient has agreed to proceed.   DESCRIPTION OF PROCEDURE: The patient is taken to Special Procedures and placed in the supine position. After adequate sedation is achieved, both groins are prepped and draped in sterile fashion. Ultrasound is placed in a sterile sleeve. Ultrasound is utilized secondary to a lack of appropriate landmarks and to avoid vascular injury. Under direct ultrasound visualization after recording an image for the permanent record, the common femoral artery is identified. It is echolucent in its lumen and pulsatile,  indicating patency. A micropuncture needle is then inserted into the anterior wall of the common femoral artery with real-time ultrasound visualization, a Microwire, followed by a micro sheath, J-wire followed by a 5-French and 5-French pigtail catheter. The pigtail catheter is positioned at the level of T12, and bolus injection of contrast is used to create an image of the abdominal aorta in the AP projection. The pigtail catheter is repositioned to above the bifurcation, and bilateral oblique views of the pelvis are obtained. After review of the images, an Advantage Wire is used to cross the bifurcation with the pigtail catheter, and the catheter is advanced down into the common femoral and then the superficial femoral. Hand injection of contrast is used to demonstrate images of the femoral and popliteal system. The Advantage Wire is negotiated down into the mid SFA, and subsequently the catheter is removed. The 5-French sheath is exchanged for a 6-French Ansel sheath. With the endoscope positioned with its tip in the mid common femoral, a straight slip catheter is utilized with the Advantage Wire to negotiate the entire superficial femoral artery. The artery appears to be heavily calcified with significant subtotal occlusions at multiple locations. Once the catheter has been advanced down into the distal popliteal, distal runoff is obtained. The patient has three-vessel runoff down to the ankle.   Beginning with a 4 x 15 balloon, multiple angioplasties are made. Subsequently, a 5 x 20 Armada Balloon is utilized, and then ultimately a 6 x 25 Armada Balloon is utilized. Following the 6 mm inflation along the course of the SFA with the inflations being to approximately 14 atmospheres for one minute, hand injection of contrast through the sheath demonstrates wide patency of the superficial femoral artery down into the popliteal  with maintenance of the three-vessel runoff. The sheath is then pulled back into the  right external iliac. Oblique view is obtained and a StarClose device deployed without difficulty. There are no immediate complications.   INTERPRETATION: The abdominal aorta is opacified with a bolus injection of contrast. There are no hemodynamically significant stenoses noted. The common iliacs are widely patent. External iliacs demonstrate moderate disease, but they do not appear to have hemodynamically significant lesions noted.   The left common femoral is widely patent. The profunda femoris is patent; however, it demonstrates a string sign in the first 1 cm or so. The SFA demonstrates multiple heavily calcified lesions along its entire course from its origin down to Hunter's canal. The proximal popliteal is also involved; however, the mid and distal popliteal appear to be rather smooth and free of hemodynamically significant stenoses. The trifurcation is patent, and there is three-vessel runoff to the ankle.   Following angioplasty in a serial fashion of the SFA beginning at [facility] canal and extending up through the common femoral, there is patency which continues to improve. Essentially a 4, 5 and then to 6 mm inflation was performed. Follow-up angiography after the 6 mm demonstrates rapid flow of contrast with resolution of the stenoses of the SFA.   SUMMARY: Successful recanalization of the superficial femoral artery of the left lower extremity for the treatment of rest pain as described above.   ____________________________ Katha Cabal, MD ggs:cbb D: 08/26/2011 16:54:58 ET T: 08/26/2011 18:00:35 ET JOB#: [id number]  cc: Katha Cabal, MD, <Dictator> Katha Cabal MD ELECTRONICALLY SIGNED 09/03/2011 9:00

## 2014-09-24 NOTE — Op Note (Signed)
PATIENT NAME:  Robert [name], Robert [name] MR#:  [id number] DATE OF BIRTH:  1940/10/25  DATE OF PROCEDURE:  09/30/2011  PREOPERATIVE DIAGNOSES:  1. Complication AV dialysis graft with prolonged bleeding and difficulty with cannulation.  2. End-stage renal disease requiring hemodialysis.  3. Peripheral arterial disease.   POSTOPERATIVE DIAGNOSES:  1. Complication AV dialysis graft with prolonged bleeding and difficulty with cannulation.  2. End-stage renal disease requiring hemodialysis.  3. Peripheral arterial disease.   PROCEDURES PERFORMED:  1. Contrast injection left arm brachial axillary dialysis graft.  2. Percutaneous transluminal angioplasty venous cannulation site.  3. Placement of an 8 x 70 straight FLAIR stent venous cannulation site for failed angioplasty.  4. Percutaneous transluminal angioplasty of the arterial cannulation site.  5. Percutaneous transluminal angioplasty with stent placement of the venous outflow and axillary vein.   SURGEON: Katha Cabal, MD  SEDATION: Versed 3 mg plus fentanyl 100 mcg administered IV. Continuous ECG, pulse oximetry and cardiopulmonary monitoring was performed throughout the entire procedure by the interventional radiology nurse. Total sedation time was 45 minutes.   ACCESS: 7 French sheath, antegrade direction, left arm brachial axillary dialysis graft.   CONTRAST USED: Isovue 70 mL.   FLUORO TIME: Six minutes.   INDICATIONS: Robert [name] is a 74 year old gentleman maintained on hemodialysis via a left arm brachial axillary dialysis graft. Recently they have had increasing difficulties with accessing cannulation. They have also noted significantly low flows. 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 Risks and benefits for treatment of these lesions were reviewed, all questions are answered. Patient has agreed to proceed.   DESCRIPTION OF PROCEDURE: Patient is taken to special procedures, placed in supine position. After adequate sedation is achieved, he is positioned supine with his left arm extended palm upward. Left arm is prepped and draped in sterile fashion. 1% lidocaine is infiltrated in the soft tissues overlying the palpable graft near the arterial anastomosis in an antegrade direction. Micropuncture needle followed by microwire, J-wire followed by 6 French sheath is inserted. Hand injection of contrast is then used to demonstrate the anatomy of the AV graft as well as the central veins. The lesions are identified and are confirmed consistent with the ultrasound. 3000 units of heparin is given. At a later point another 4000 is given as well. Initially an 8 x 50 FLAIR stent is deployed across the venous anastomosis, however, this ends up with the stent pointed more straight instead of taking a curve and angled directly into the wall of the vein. Therefore, a second 8 x 70 FLAIR stent is utilized to create more of a natural curve with the stent flared segment oriented parallel and in axial line with the stent. It is post dilated with the 8 x 4 Conquest. It should be noted attempts at moving the initial FLAIR stent back somewhat were unsuccessful.   The sheath is then repositioned so that it is almost completely out and hand injection of contrast with the balloon inflated demonstrates the high-grade stenosis at the arterial cannulation site. The 8 x 4 Conquest balloon is positioned here, inflated to 20 atmospheres for one minute. Follow-up angiography demonstrates resolution of this areas stricture. The balloon is then advanced across the venous cannulation site and again inflated to 20 atmospheres. This  is a stenosis associated with the pseudoaneurysm. Follow-up  angiography demonstrates that there is a flap and this is now associated with a pseudoaneurysm and therefore an 8 x 70 FLAIR stent is placed across this area covering both the stenotic flap area as well as the pseudoaneurysm post dilated with the 8 balloon. Follow-up angiography demonstrates there is rapid flow of contrast through all three areas, rapid flow of contrast through the graft and by palpation the graft has a soft thrill and is no longer pulsatile.   Pursestring suture of 4-0 Monocryl is placed around a 7 [place] sheath. 7 French sheath was utilized after the initial FLAIR stent was placed in order to achieve hemostasis.   There were no immediate complications and the patient is taken to recovery in stable condition.   INTERPRETATION: Initial views of the fistula demonstrate three high-grade lesions, each are individual and discrete, two are located within the graft, one at the arterial cannulation site, one at the venous cannulation site. A third high-grade stenosis associated with the venous anastomosis and extends into the axillary vein. All three are angioplastied to 8 mm with the Conquest. The pseudoaneurysm and venous cannulation stenosis is covered with a FLAIR stent straight 8 mm diameter and posted to 8 mm. The venous anastomosis is treated as described above with a FLAIR stent.   SUMMARY: Successful salvage of left arm brachial axillary dialysis graft.   ____________________________ Katha Cabal, MD ggs:cms D: 09/30/2011 09:06:15 ET T: 09/30/2011 09:19:28 ET JOB#: [id number]  cc: Katha Cabal, MD, <Dictator> Katha Cabal MD ELECTRONICALLY SIGNED 10/10/2011 7:51

## 2014-09-27 NOTE — Consult Note (Signed)
PATIENT NAME:  Robert [name], Robert [name] MR#:  [id number] DATE OF BIRTH:  01-15-1941  DATE OF CONSULTATION:  05/30/2014  REQUESTING PHYSICIAN:  Max Sane, MD CONSULTING PHYSICIAN:  Cheral Marker. Ola Spurr, MD  REASON FOR CONSULTATION: Bacteremia and cellulitis.   HISTORY OF PRESENT ILLNESS: A 74 year old gentleman with history of hypertension and end-stage renal disease, on hemodialysis, right renal cell carcinoma, diabetes, peripheral vascular disease, who was admitted December 28 initially to the floor with some increasing lethargy and inability to walk. It was suspected he had a non-ST elevation MI and was having some encephalopathy. However, since admission his blood cultures have returned positive for gram-positive cocci in pairs and chains. He had a decompensation overnight on the floor and is now in the intensive care unit on low dose pressors. He does remain alert and awake, however.   Per patient and family, he has had increasing edema over the last week-2 weeks. This improved yesterday after dialysis. He had some increasing redness in his legs as well. He has been followed with Dr. Delana Meyer for peripheral artery disease and also has seen Dr. Cleda Mccreedy in the pat for amputation of his infected toes. He has had some drainage, especially from the right amputation site. His most recent surgery was on the right foot with excisional debridement of the 2nd and 3rd metatarsals in June 2015.   PAST MEDICAL HISTORY: 1.  End-stage renal disease, on hemodialysis.  2.  Hypertension.  3.  Hyperlipidemia.  4.  Coronary artery disease, status post CABG.  5.  Peripheral vascular disease, follows with Dr. Delana Meyer, status post intervention.  6.  Ischemic cardiomyopathy.  7.  Chronic obstructive pulmonary disease.  8.  Diabetes.  9.  Renal cell cancer.  10.  Transitional cell carcinoma of the bladder.  11.  Atrial fibrillation.   SOCIAL HISTORY: Prior tobacco abuse. No alcohol or drug use.   FAMILY HISTORY:  Noncontributory.   ALLERGIES: CELEBREX CIPRO, CONTRAST DYE, DILAUDID, MEVACOR, MORPHINE, AND NIACIN.  REVIEW OF SYSTEMS:  Eleven systems reviewed and negative except as per HPI.    ANTIBIOTICS SINCE ADMISSION: Include vancomycin and Zosyn begun December 28.   PHYSICAL EXAMINATION: VITAL SIGNS: T-max 101.4 this a.m. Currently 97.6, pulse 100, blood pressure 89/51, respirations 18, saturation 93% on 4 liters. GENERAL:  He is disheveled and chronically ill-appearing. He is awake and alert, however.  HEENT: Pupils equal, round, reactive to light and accommodation. Extraocular movements are intact. Sclerae anicteric. Oropharynx is dry. NECK: Supple.  HEART: Regular with a 2/6 systolic murmur.  LUNGS: Clear.  ABDOMEN: Soft, mildly distended, mildly tender to palpation.  EXTREMITIES: He has trace edema bilaterally lower extremities. SKIN: He has mild diffuse erythema over his anterior shins as well as some scaliness. He has amputation of a toe on each foot. His right foot does have some minimal serosanguineous drainage from the amputation site.   DATA: Blood cultures 2 of 2 from December 28 are growing gram-positive cocci in pairs and chains in aerobic and anaerobic bottles. White blood count on admission was 6.3, currently 13.4, hemoglobin 9.8, platelets of 72,000. Renal function is consistent with end-stage renal disease. LFTs show a low albumin at 2.3, total bilirubin 1.1. Alkaline phosphatase 203, AST 40, ALT 7.  IMAGING: Chest x-ray, December 28, showed chronic cardiomegaly. Pulmonary venous hypertension, small effusion on the left. Followup x-ray shows new extensive atelectasis of the  left lung, question aspiration or mucous plugging. Followup chest x-ray December 29, 9:00 a.m. showed slight interval improvement in the  left hemithorax aeration.   IMPRESSION: A 74 year old gentleman with history of peripheral vascular disease, end-stage renal disease, chronic obstructive pulmonary disease,  coronary artery disease as well as diabetes, admitted with increasing confusion and weakness. He has gram-positive cocci growing in 2 of 2 blood cultures. He has a graft in his left upper extremity which is used for dialysis. There is no evidence of redness, tenderness, or drainage at the site and it appears to be functioning well. His likely source is his lower extremity cellulitis and some drainage at his amputation site on the right. This clinically seems to be improving with current antibiotics.   RECOMMENDATIONS: 1.  Continue vancomycin and Zosyn pending further identification of the organism on blood cultures.  2.  Consider podiatry consultation or consultation with Dr. Delana Meyer regarding his lower extremity amputation  site. There is some mild drainage from this site, but overall it does not appear frankly infected.   3.  Pressor support as per critical care team and primary team.   Thank you for the consult. I will be glad to follow with you.     ____________________________ Cheral Marker. Ola Spurr, MD dpf:LT D: 05/30/2014 16:28:00 ET T: 05/30/2014 16:44:54 ET JOB#: [id number]  cc: Cheral Marker. Ola Spurr, MD, <Dictator> Raelie Lohr Ola Spurr MD ELECTRONICALLY SIGNED 06/04/2014 21:27

## 2014-10-01 NOTE — Discharge Summary (Signed)
PATIENT NAME:  Robert [name], Robert [name] MR#:  [id number] DATE OF BIRTH:  [birth year]  DATE OF ADMISSION:  05/29/2014 DATE OF DISCHARGE:  06/06/2014  DISCHARGE DIAGNOSES: 1. Streptococcus viridans bacteremia of unknown source.  2. Septic shock present on admission.  3. End-stage renal disease on hemodialysis.  4. Anemia of chronic disease.  5. Chronic thrombocytopenia.  6. Peripheral arterial disease.  7. Elevated troponin due to demand ischemia from sepsis.  8. Left lower lobe atelectasis.   DISCHARGE MEDICATIONS: 1. Vancomycin IV with hemodialysis until 06/17/2014.  2. Rena-Vite B complex 1 tablet 2 times a day.  3. Levothyroxine 175 mcg daily.  4. Oxycodone 5 mg 1 or 2 tablets every 6 hours as needed.  5. Ropinirole 0.5 mg oral 4 times a day.  6. MiraLax 17 grams oral daily as needed.  7. Midodrine 10 mg oral 2 times a day.  8. Nitrostat 0.4 sublingual as needed for chest pain.  9. ProAir HFA 2 puffs inhaled 4 times a day as needed.  10. Gabapentin 100 mg 3 times a day.  11. Finasteride 5 mg oral once a day.   DISCHARGE INSTRUCTIONS: Home health with PT has been set up. Renal diet with mechanical soft, thin liquids, aspiration precautions. Medications in puree. Follow up with Dr. Ola Spurr in 1 or 2 weeks and primary care physician in 1 or 2 weeks. The patient is to get vancomycin with hemodialysis until 06/17/2014.   CONSULTATIONS:  1. Dr. Juleen China with nephrology. 2. Dr. Ola Spurr with infectious disease.  3. Dr. Vickki Muff with podiatry.  4. Dr. Ermalinda Memos with palliative care.  5. Dr. Delana Meyer with vascular surgery.   IMAGING STUDIES DONE: Include:  1. Chest x-ray which showed left lower lobe atelectasis, no infiltrate.  2. Echocardiogram showed no vegetations, normal ejection fraction.   ADMITTING HISTORY AND PHYSICAL: Please see detailed H and P dictated previously.  In brief, a 74 year old Caucasian male patient with history of CAD, end-stage renal disease, and chronic  hypotension, presented to the hospital with complaints of weakness.   HOSPITAL COURSE: The patient was found to have septic shock and was admitted to the CCU.  He was started on Levophed.  Blood cultures grew Streptococcus viridans, source could not be found.  Chest x-ray showed atelectasis.  Echo showed no vegetations.  Repeat blood cultures have been negative.  He does have a well-healed wound on his foot, which he was seen by vascular surgery and podiatry, who did not feel this was source of infection.  The patient will be treated with vancomycin for Streptococcus viridans for a total of 2 weeks and follow up with infectious disease as outpatient.  Vancomycin has been set up with dialysis.   He did have elevated troponin, which was secondary to demand ischemia. No chest pain or shortness of breath.   The patient worked with physical therapy and is being discharged home with home health PT.   PHYSICAL EXAMINATION:  Prior to discharge, the patient's lungs sound clear.  S1, S2 heard. Abdomen soft.  The patient is alert and oriented x3.   TIME SPENT: Time spent on day of discharge in discharge activity was 40 minutes.   ____________________________ [name], MD srs:mw D: 06/06/2014 13:07:00 ET T: 06/06/2014 13:51:28 ET JOB#: [id number]  cc: [name] R. [name], MD, <Dictator> [name] MD ELECTRONICALLY SIGNED 06/16/2014 12:50

## 2014-10-01 NOTE — Op Note (Signed)
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    PROCEDURE PERFORMED:  1.  Contrast injection left arm brachial axillary dialysis graft.  2.  Insertion of 6 French sheath, antegrade direction, left arm brachial axillary dialysis graft.  3.  Insertion of 7 French sheath, retrograde direction, left arm brachial axillary dialysis graft.  4.  Mechanical thrombectomy using Trerotola device, left arm brachial axillary dialysis graft.  5.  Percutaneous transluminal angioplasty and stent placement of the cannulation zone/venous portion of AV graft.  6.  Percutaneous transluminal angioplasty to 6 mm arterial anastomosis.   SURGEON: Katha Cabal, MD   SEDATION: Versed plus fentanyl.   CONTRAST USED: Isovue 55 mL.   FLUOROSCOPY TIME: 12.3 minutes.   INDICATIONS: Mr. Copenhaver is a 74 year old gentleman with multiple medical problems who presents with thrombosis of his AV graft. He is therefore undergoing thrombectomy with intervention for graft salvage. Risks and benefits have been reviewed. All questions answered. The patient agrees to proceed.   DESCRIPTION OF PROCEDURE: The patient is taken to special procedures, placed in the supine position. After adequate sedation is achieved, he is positioned with his left arm extended palm upward. The left arm is prepped and draped in sterile fashion. Appropriate timeout is called.   Lidocaine 1% was infiltrated in the soft tissues overlying the graft near the arterial anastomosis in an antegrade direction. Microneedle is inserted, microwire followed by microsheath, J-wire followed by a 6 [place] sheath. Hand injection of contrast demonstrates thrombus within the graft. Floppy Glidewire and Kumpe catheter were then negotiated into  the central venous system with the catheter in the SVC. Serial imaging is performed to ensure there is no central venous stenosis. Heparin 4000 units of heparin is given. The Trerotola device is then advanced through the venous portion and engaged, multiple passes are made. There is a high-grade narrowing or stenosis within the previously placed stented segment. There is also a large pseudoaneurysm noted, which is filled with thrombus. There is also narrowing diffusely throughout the cannulation zone. However, once the majority of the thrombus has been treated, the patient is once again anesthetized overlying the graft at the level of the deltoid in a retrograde direction and a microneedle is inserted, microwire followed by microsheath, J-wire followed by a 6 [place] sheath. Using a wire and Kumpe catheter, the catheter  is negotiated into the brachial artery and hand injection of contrast is used to demonstrate the brachial artery is patent. There is thrombus within the arterial portion from the antegrade stent to the anastomosis.   Over the wire, Trerotola device is then used and the thrombus is cleared from the arterial portion. A small area remains and this is later treated with the balloon angioplasty. There is also narrowing at the anastomosis, and as noted, once the wire is reintroduced, 6 x 4 mm balloon is inflated across this area to treat both the small area residual thrombus and the narrowing at the arterial anastomosis. Followup imaging demonstrates excellent result, but there is persistent thrombus both within the pseudoaneurysm as well as around the sheath. Several more passes are made. Another pass is made through the antegrade sheath. Ultimately, the majority of thrombus is cleared, the high-grade narrowing within the previously stented segment as well as in the area of cannulation. The pseudoaneurysm are  then addressed through the retrograde sheath, which is upsized to a 7 [place], two Viabahn  stents, an 8 x 5 and an 8 x 5, were then deployed serially and post dilated with an 8 balloon. This successfully excluded the pseudoaneurysm as well as treated the multiple narrowings. Prior to doing this through the antegrade sheath the 8 mm balloon was advanced across the narrowed area within the previously placed stents and this was balloon angioplastied as well.   With the KMP catheter now negotiated back into the brachial artery, imaging of the entire graft is now performed and the graft demonstrates rapid flow of contrast with good patency.   Pursestring sutures are placed around the sheath sites and these are secured with the Monocryl suture.   INTERPRETATION: Initial views demonstrate thrombus within the graft. Central veins when imaged serially demonstrate patency. There are previously placed venous stent extending into the axillary vein and in the mid portion of these stents there is marked narrowing. There is also narrowing marked in the arterial anastomosis as well as the venous cannulation site. Once mechanical thrombectomy has been successfully performed with the Trerotola device, these individual areas are treated. The mid portion of the graft is treated with Viabahn stents to both exclude the pseudoaneurysm as well as allow for more ready cannulation in the mid portion where significant disease has accumulated.   SUMMARY: Successful thrombectomy with recanalization of the left arm brachial axillary dialysis graft.   ____________________________ Katha Cabal, MD ggs:AT D: 06/13/2014 17:08:12 ET T: 06/14/2014 02:07:40 ET JOB#: [id number]  cc: Katha Cabal, MD, <Dictator> Katha Cabal MD ELECTRONICALLY SIGNED 07/05/2014 17:41

## 2015-12-29 IMAGING — CR DG CHEST 1V PORT
1 series · 1 of 1 positions shown · non-contrast
Comparison: CT 05/02/2014.  Radiography 01/30/2014.

CLINICAL DATA: Dialysis patient.  Hypoxia.

EXAM:
PORTABLE CHEST - 1 VIEW

[ap]
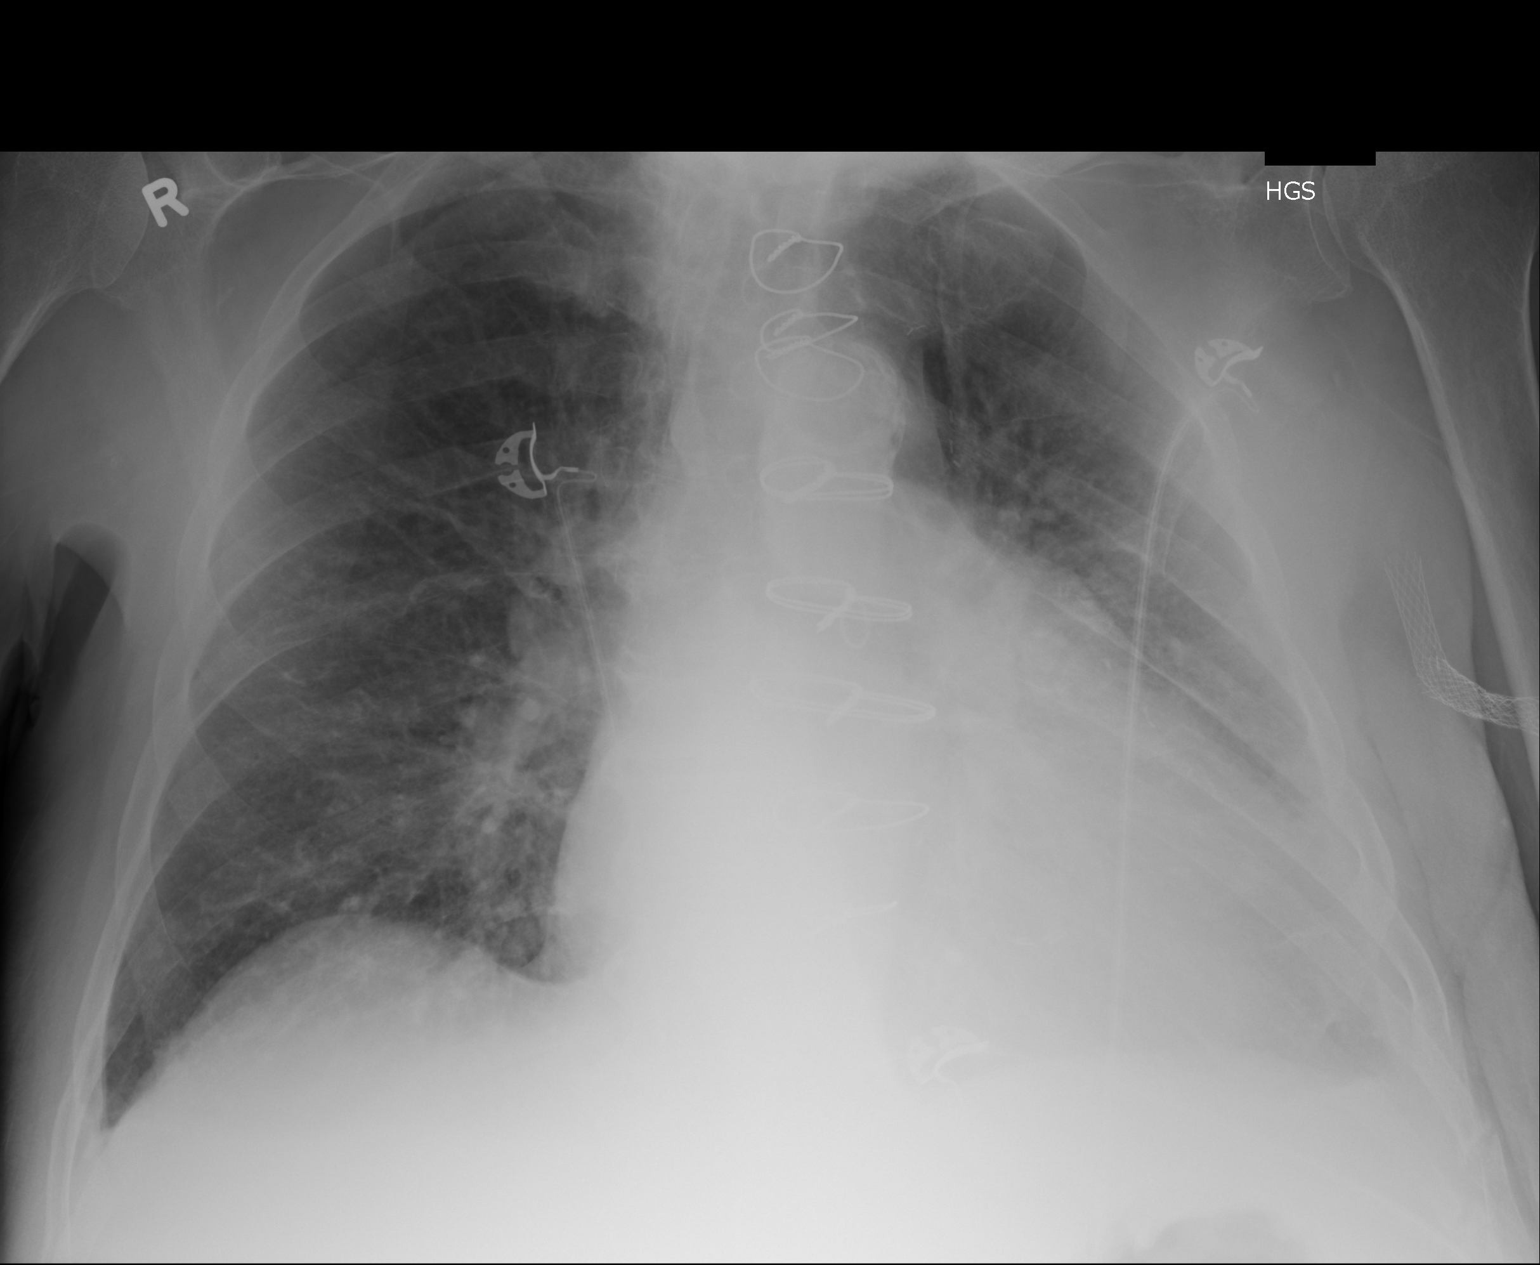

[1 of 1 positions shown; findings below may reference images not displayed]

FINDINGS: There has been previous median sternotomy. The heart is enlarged.
There is venous hypertension. There is a small amount of pleural
fluid on the left. No consolidation or lobar collapse.
IMPRESSION: Chronic cardiomegaly. Previous CABG. Pulmonary venous hypertension
without frank edema. Small effusion on the left.

## 2015-12-30 IMAGING — CR DG CHEST 1V PORT
1 series · 2 of 2 positions shown · non-contrast
Comparison: 05/29/2014

CLINICAL DATA: Increased heart rate.  Hypoxia.

EXAM:
PORTABLE CHEST - 1 VIEW

[Series 1: ap · 0.17mm/px · 2 of 2 slices shown]
[im 1/2]
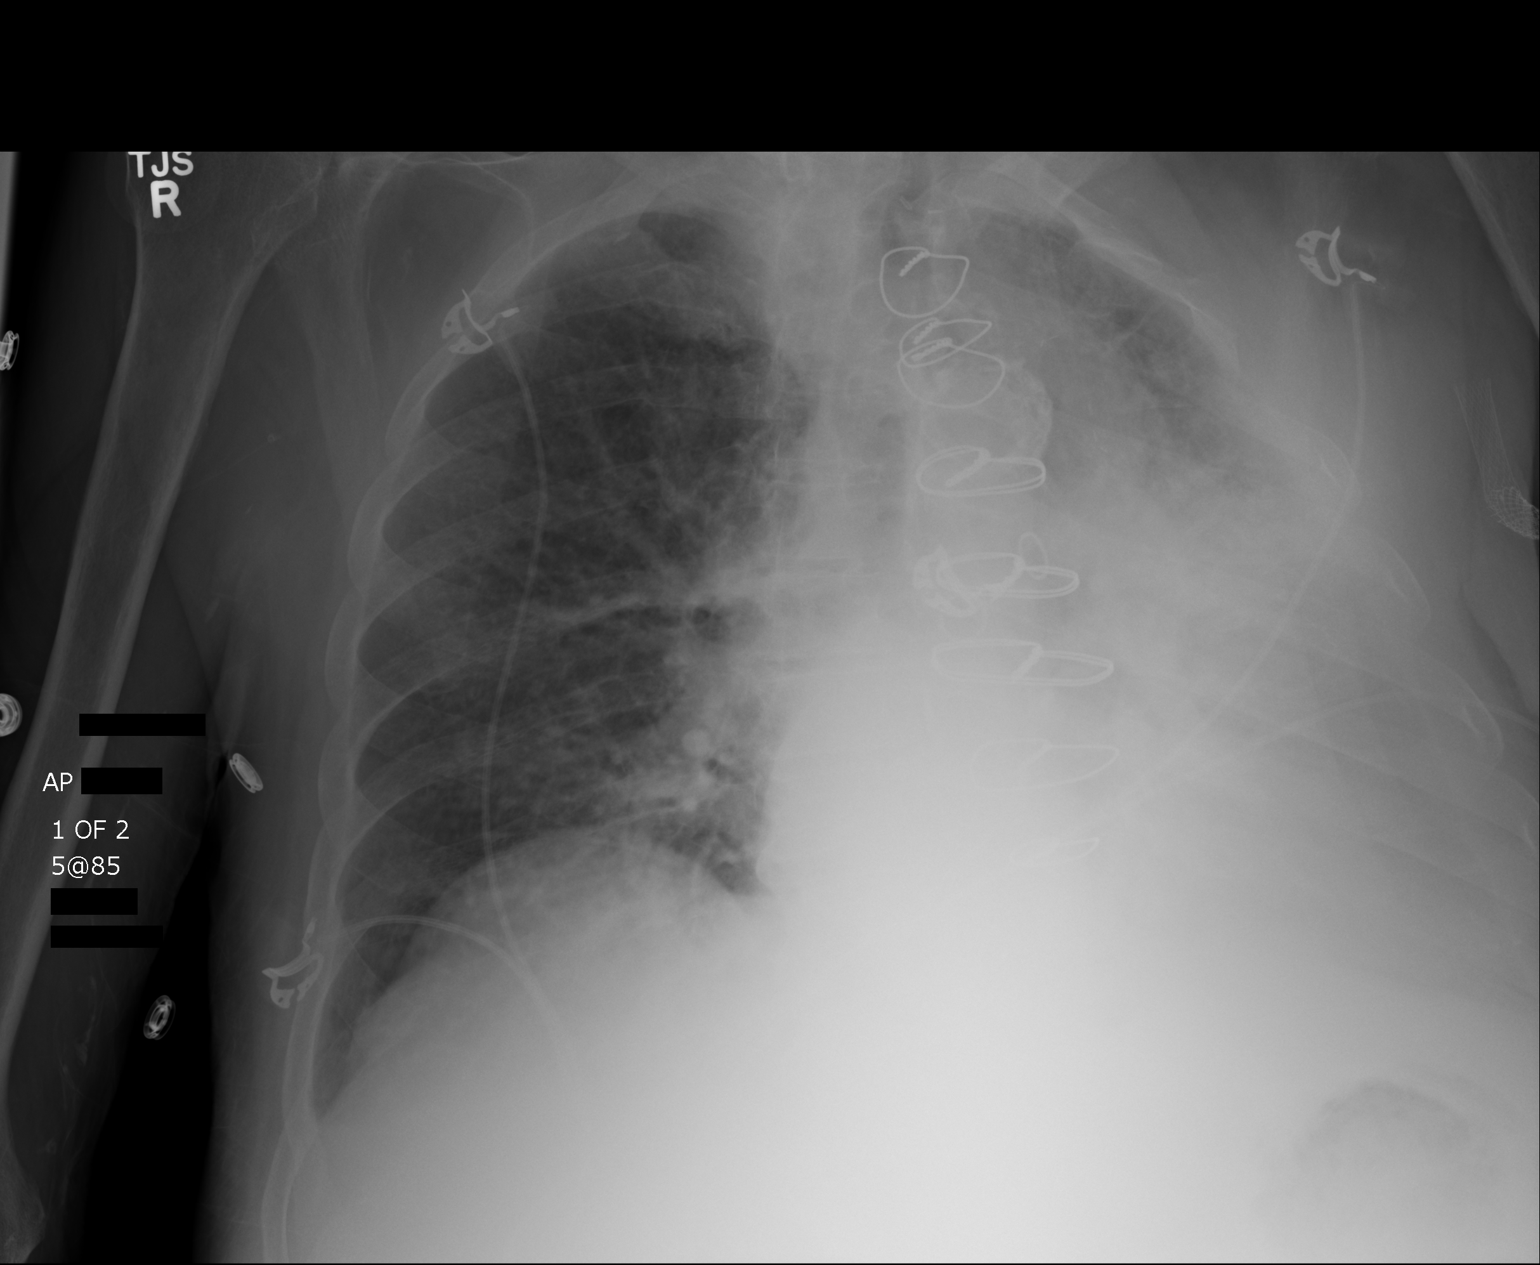
[im 2/2]
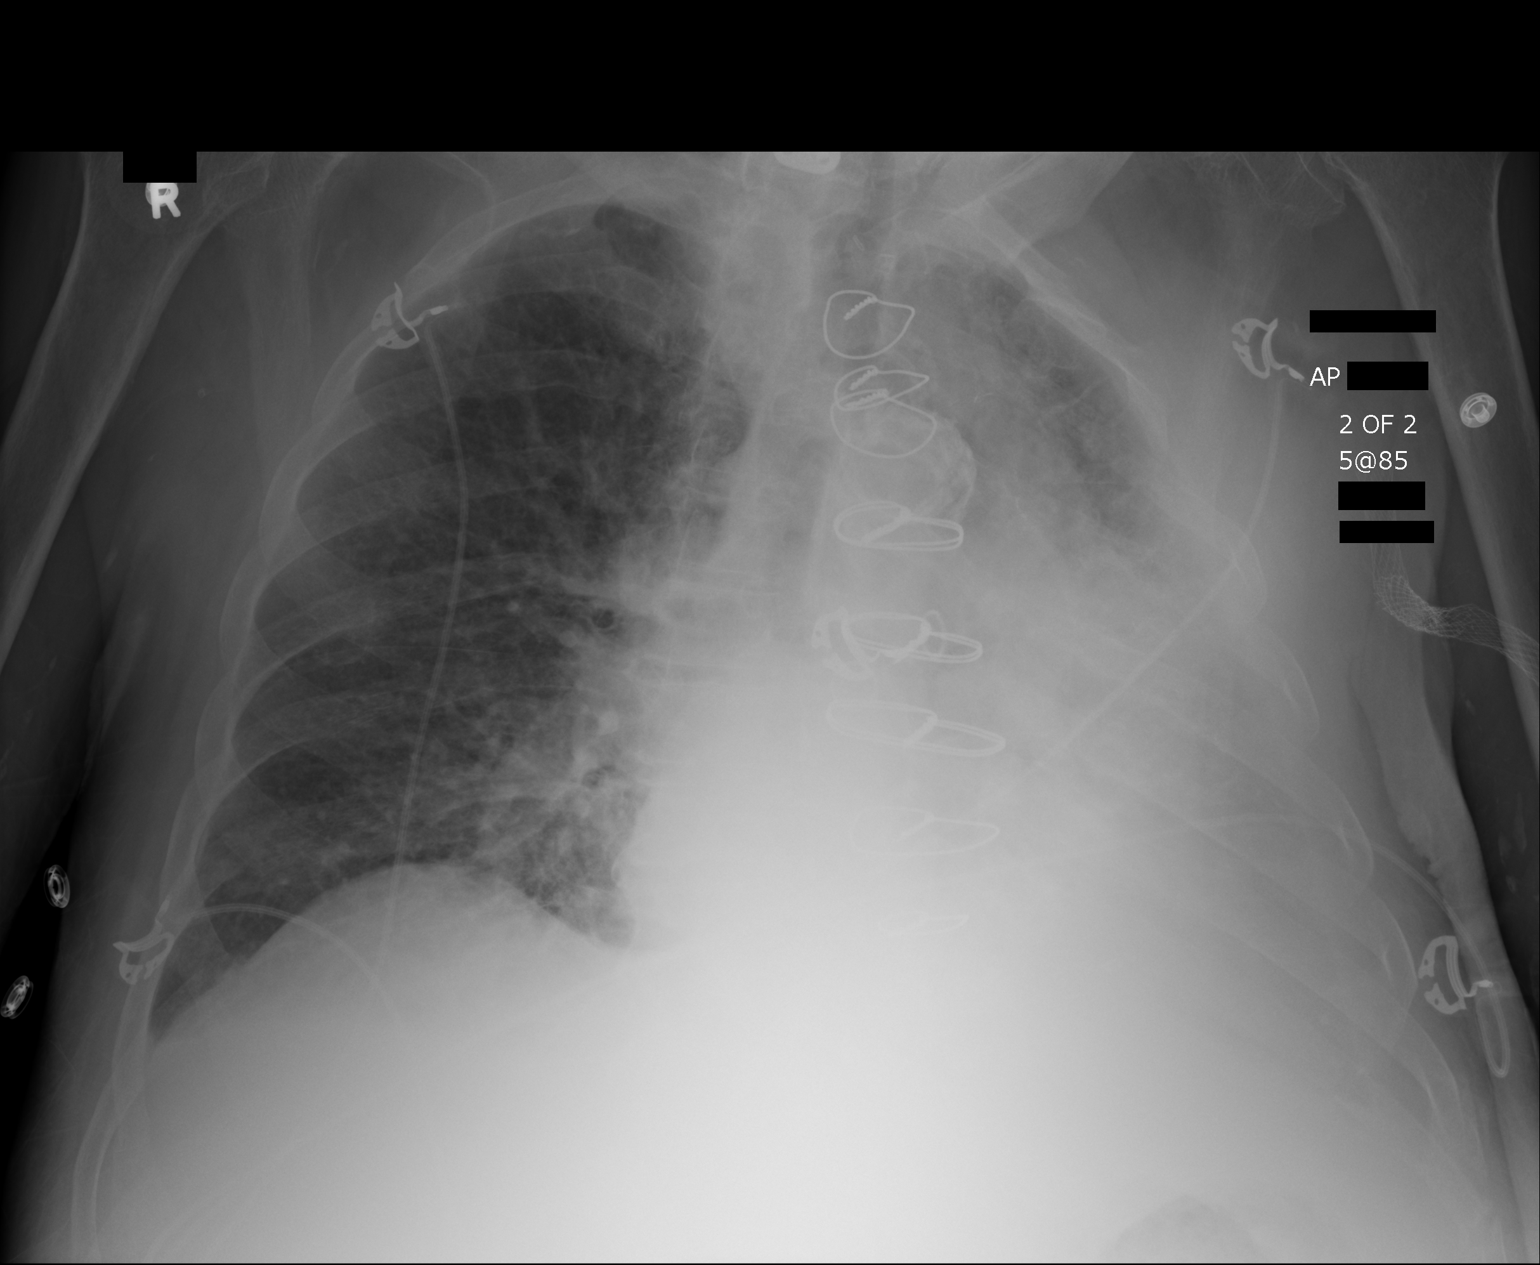

[2 of 2 positions shown; findings below may reference images not displayed]

FINDINGS: New significantly increased density of the left chest, with volume
loss.

Stable appearance of the aerated right lung. Numerous pulmonary
nodules in the right lung noted on CT 05/02/2014 is not clearly
visible radiographically.

Chronic cardiomegaly, status post CABG. The heart and mediastinum is
shifted to the left.
IMPRESSION: New extensive atelectasis of the left lung, question aspiration or
mucous plugging.

## 2015-12-30 IMAGING — CR DG CHEST 1V PORT
1 series · 2 of 2 positions shown · non-contrast
Comparison: 05/30/2014 and correlation with chest CT from
05/02/2014.

CLINICAL DATA: 73-year-old with shortness of breath

EXAM:
PORTABLE CHEST - 1 VIEW

[Series 1: ap · 0.17mm/px · 2 of 2 slices shown]
[im 1/2]
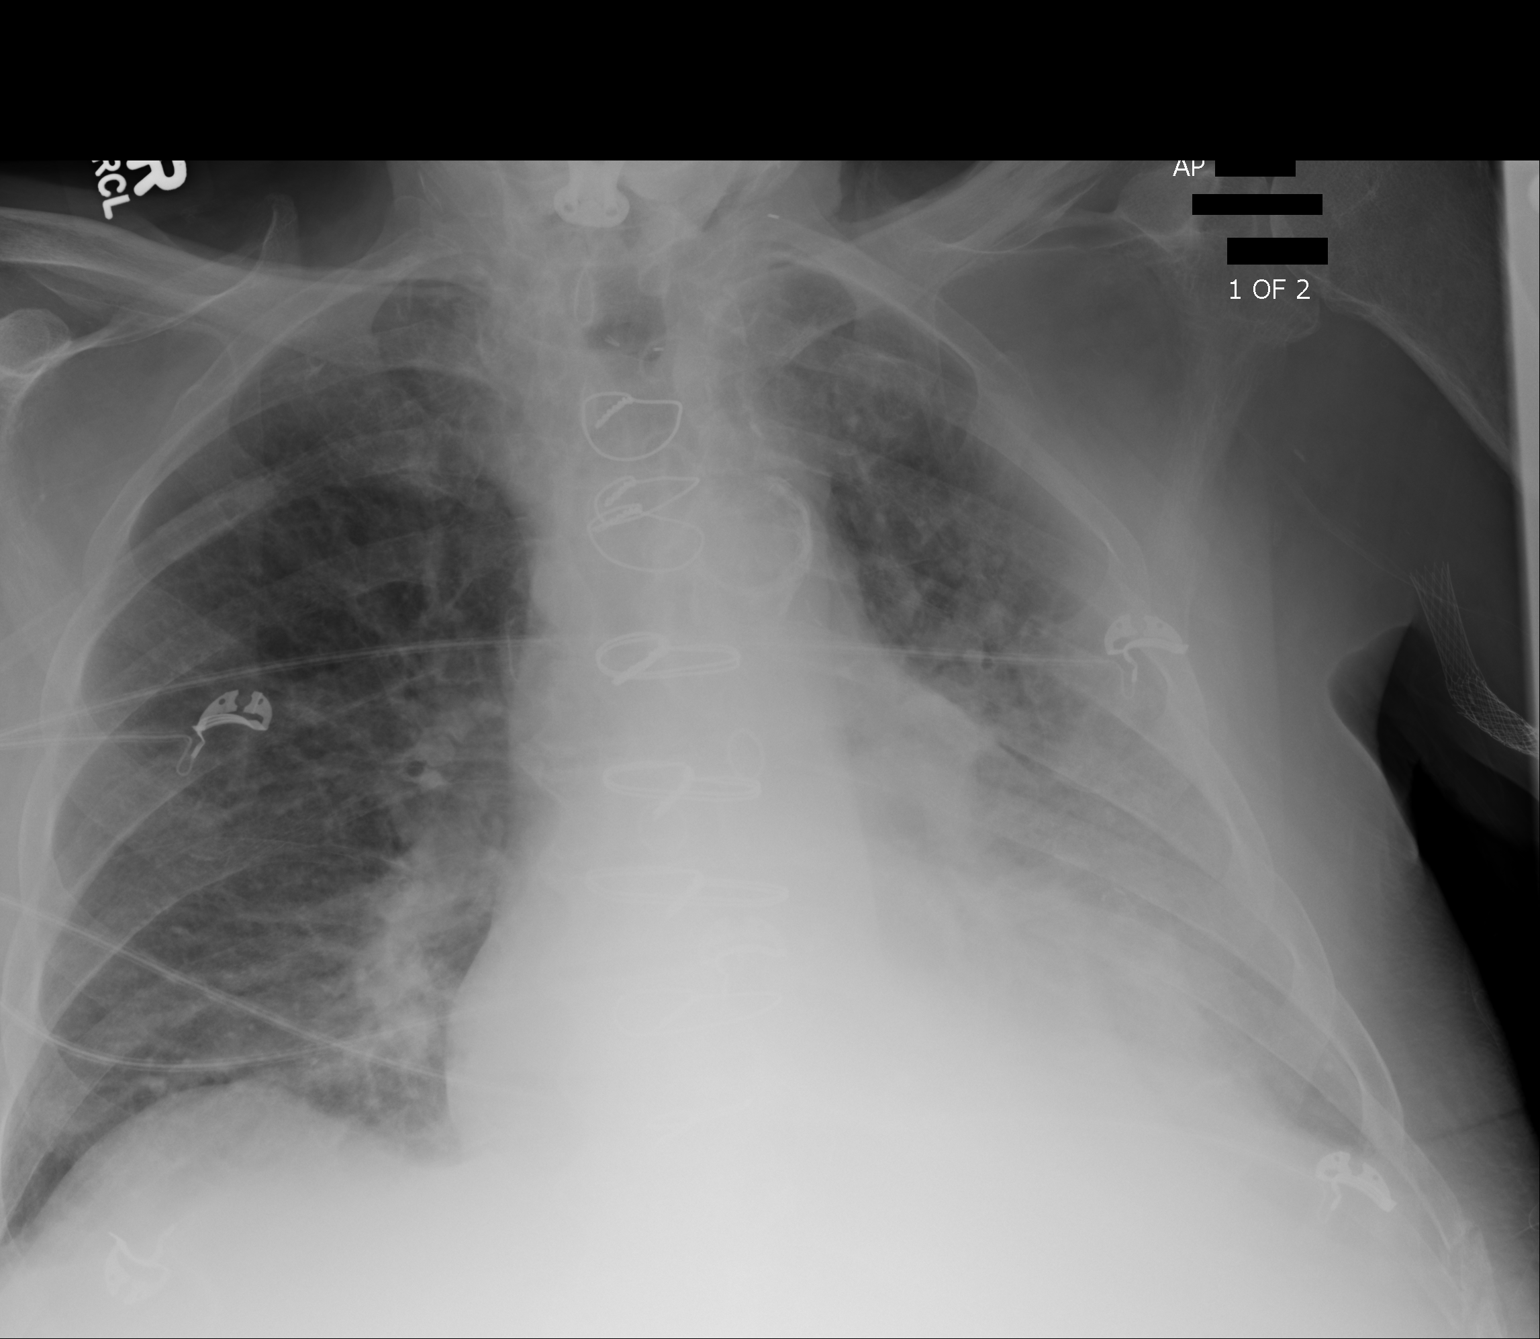
[im 2/2]
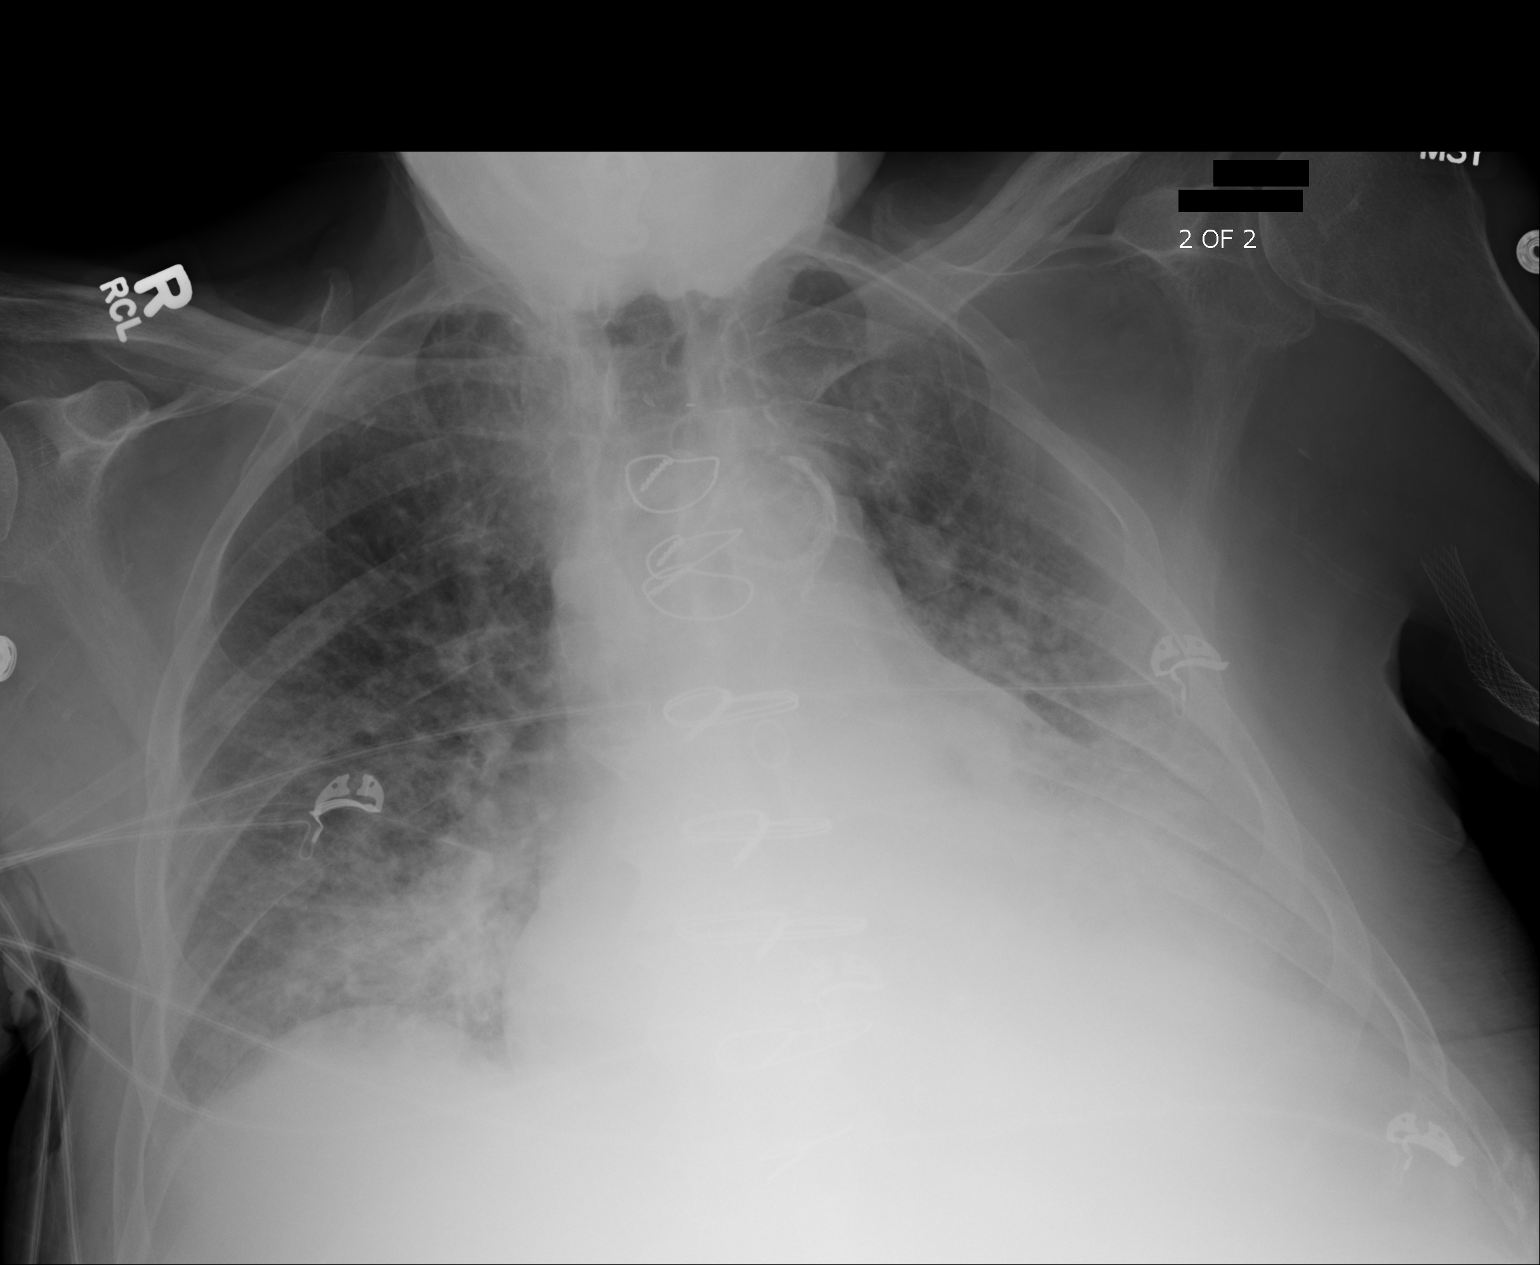

[2 of 2 positions shown; findings below may reference images not displayed]

FINDINGS: Median sternotomy wires, surgical clips and cervical fixation
hardware present.

The cardiac silhouette and mediastinal contours are unchanged. The
heart remains enlarged. Atherosclerotic aortic calcifications are
present.

There has been slight interval improvement in aeration of the left
upper lobe. Left lower lobe consolidation is slightly decreased from
prior. The pulmonary vessels are mildly prominent. Multiple
pulmonary nodules identified on recent chest CT are not clearly
visible radiographically. There is no pneumothorax. The left
costophrenic angle is slightly blunted.

The osseous structures are unchanged.
IMPRESSION: 1. Slight interval improvementi in left hemithorax aeration. Left
lower lobe consolidation remains suspicious for mucus plugging or
aspiration/infectious pneumonitis.
2. Probable tiny left pleural effusion.
3. Cardiomegaly with associated pulmonary vascular congestion. No
definite overt pulmonary edema.
